# Patient Record
Sex: Female | Born: 1970 | Race: White | Hispanic: No | Marital: Single | Smoking: Current some day smoker
Health system: Southern US, Community
[De-identification: ages and names within clinical notes are randomized; demographics above are authoritative.]

## PROBLEM LIST (undated history)

## (undated) DIAGNOSIS — G4733 Obstructive sleep apnea (adult) (pediatric): Secondary | ICD-10-CM

## (undated) DIAGNOSIS — F329 Major depressive disorder, single episode, unspecified: Secondary | ICD-10-CM

## (undated) DIAGNOSIS — E7800 Pure hypercholesterolemia, unspecified: Secondary | ICD-10-CM

## (undated) DIAGNOSIS — F32A Depression, unspecified: Secondary | ICD-10-CM

## (undated) DIAGNOSIS — F419 Anxiety disorder, unspecified: Secondary | ICD-10-CM

## (undated) DIAGNOSIS — I509 Heart failure, unspecified: Secondary | ICD-10-CM

## (undated) DIAGNOSIS — I10 Essential (primary) hypertension: Secondary | ICD-10-CM

## (undated) HISTORY — PX: ELBOW SURGERY: SHX618

## (undated) HISTORY — DX: Obstructive sleep apnea (adult) (pediatric): G47.33

## (undated) HISTORY — DX: Heart failure, unspecified: I50.9

## (undated) HISTORY — PX: CHOLECYSTECTOMY: SHX55

---

## 2005-05-23 ENCOUNTER — Inpatient Hospital Stay (HOSPITAL_COMMUNITY): Admission: AD | Admit: 2005-05-23 | Discharge: 2005-06-03 | Payer: Self-pay | Admitting: Obstetrics and Gynecology

## 2005-05-23 ENCOUNTER — Ambulatory Visit: Payer: Self-pay | Admitting: Gynecology

## 2005-05-24 ENCOUNTER — Ambulatory Visit: Payer: Self-pay | Admitting: Neonatology

## 2005-05-31 ENCOUNTER — Ambulatory Visit: Payer: Self-pay | Admitting: Neonatology

## 2005-06-01 ENCOUNTER — Encounter (INDEPENDENT_AMBULATORY_CARE_PROVIDER_SITE_OTHER): Payer: Self-pay | Admitting: *Deleted

## 2007-07-03 ENCOUNTER — Emergency Department (HOSPITAL_COMMUNITY): Admission: EM | Admit: 2007-07-03 | Discharge: 2007-07-03 | Payer: Self-pay | Admitting: Emergency Medicine

## 2008-05-20 ENCOUNTER — Emergency Department (HOSPITAL_COMMUNITY): Admission: EM | Admit: 2008-05-20 | Discharge: 2008-05-20 | Payer: Self-pay | Admitting: Emergency Medicine

## 2008-08-27 ENCOUNTER — Emergency Department (HOSPITAL_COMMUNITY): Admission: EM | Admit: 2008-08-27 | Discharge: 2008-08-27 | Payer: Self-pay | Admitting: Emergency Medicine

## 2008-10-28 ENCOUNTER — Emergency Department (HOSPITAL_COMMUNITY): Admission: EM | Admit: 2008-10-28 | Discharge: 2008-10-28 | Payer: Self-pay | Admitting: Emergency Medicine

## 2008-12-28 ENCOUNTER — Emergency Department (HOSPITAL_COMMUNITY): Admission: EM | Admit: 2008-12-28 | Discharge: 2008-12-28 | Payer: Self-pay | Admitting: Emergency Medicine

## 2009-03-25 ENCOUNTER — Emergency Department (HOSPITAL_COMMUNITY): Admission: EM | Admit: 2009-03-25 | Discharge: 2009-03-25 | Payer: Self-pay | Admitting: Emergency Medicine

## 2010-04-11 LAB — URINALYSIS, ROUTINE W REFLEX MICROSCOPIC
Glucose, UA: NEGATIVE mg/dL
Nitrite: NEGATIVE
Protein, ur: 100 mg/dL — AB
Specific Gravity, Urine: >1.03 — ABNORMAL HIGH (ref 1.005–1.030)
Urobilinogen, UA: 0.2 mg/dL (ref 0.0–1.0)

## 2010-04-11 LAB — URINE MICROSCOPIC-ADD ON

## 2010-05-22 NOTE — Discharge Summary (Signed)
Beth Sanford, Beth Sanford                 ACCOUNT NO.:  [id number]   MEDICAL RECORD NO.:  [id number]          PATIENT TYPE:  INP   LOCATION:  9303                          FACILITY:  [facility]   PHYSICIAN:  Ginger Carne, MD  DATE OF BIRTH:  17-May-1970   DATE OF ADMISSION:  05/23/2005  DATE OF DISCHARGE:  06/03/2005                                 DISCHARGE SUMMARY   PRIMARY DISCHARGE DIAGNOSIS:  Preterm premature rupture of membranes leading  to preterm spontaneous vaginal delivery leading.   HISTORY OF PRESENT ILLNESS:  This is a 40 year old Caucasian female, G3, 0-1-  2-1, who delivered at 62 and 5/7ths weeks after being admitted 9 days  earlier secondary to rupture of membranes.  The patient was treated with  antibiotics, as well as given betamethasone, in anticipation of delivery.  On the day of delivery, the patient began running a fever and starting  having uterine irritability.  In addition, the fetal heart monitor showed  developing tachycardia on the part of the infant.  The patient, at that  point, was induced with Pitocin secondary to development of most likely  chorioamnionitis.  Delivered normal spontaneous vaginal with epidural  anesthesia.  A three vessel cord was spontaneous with estimated blood loss  of less than 500 mL.  No lacerations were seen.  Apgars of 6/7.  Mom is  bottle feeding.  She is O positive and rubella immune.  The patient will be  given the Depo shot prior to discharge.   She is to follow up at the [facility] in approximately 6 weeks  for a postpartum appointment.      Ginger Carne, MD  Electronically Signed     Ginger Carne, MD  Electronically Signed    SHB/MEDQ  D:  06/03/2005  T:  06/03/2005  Job:  [id number]

## 2010-07-21 ENCOUNTER — Emergency Department (HOSPITAL_COMMUNITY)
Admission: EM | Admit: 2010-07-21 | Discharge: 2010-07-21 | Disposition: A | Discharge status: Home / Self Care | Payer: Self-pay | Attending: Emergency Medicine | Admitting: Emergency Medicine

## 2010-07-21 ENCOUNTER — Encounter: Payer: Self-pay | Admitting: *Deleted

## 2010-07-21 DIAGNOSIS — X58XXXA Exposure to other specified factors, initial encounter: Secondary | ICD-10-CM | POA: Insufficient documentation

## 2010-07-21 DIAGNOSIS — S29011A Strain of muscle and tendon of front wall of thorax, initial encounter: Secondary | ICD-10-CM

## 2010-07-21 DIAGNOSIS — F17200 Nicotine dependence, unspecified, uncomplicated: Secondary | ICD-10-CM | POA: Insufficient documentation

## 2010-07-21 DIAGNOSIS — F341 Dysthymic disorder: Secondary | ICD-10-CM | POA: Insufficient documentation

## 2010-07-21 DIAGNOSIS — R059 Cough, unspecified: Secondary | ICD-10-CM | POA: Insufficient documentation

## 2010-07-21 DIAGNOSIS — H9209 Otalgia, unspecified ear: Secondary | ICD-10-CM | POA: Insufficient documentation

## 2010-07-21 DIAGNOSIS — R05 Cough: Secondary | ICD-10-CM

## 2010-07-21 DIAGNOSIS — IMO0002 Reserved for concepts with insufficient information to code with codable children: Secondary | ICD-10-CM | POA: Insufficient documentation

## 2010-07-21 DIAGNOSIS — I10 Essential (primary) hypertension: Secondary | ICD-10-CM | POA: Insufficient documentation

## 2010-07-21 HISTORY — DX: Essential (primary) hypertension: I10

## 2010-07-21 HISTORY — DX: Major depressive disorder, single episode, unspecified: F32.9

## 2010-07-21 HISTORY — DX: Pure hypercholesterolemia, unspecified: E78.00

## 2010-07-21 HISTORY — DX: Anxiety disorder, unspecified: F41.9

## 2010-07-21 HISTORY — DX: Depression, unspecified: F32.A

## 2010-07-21 MED ORDER — HYDROCODONE-ACETAMINOPHEN 5-325 MG PO TABS: ORAL_TABLET | ORAL | Status: AC

## 2010-07-21 MED ORDER — DEXAMETHASONE SODIUM PHOSPHATE 4 MG/ML IJ SOLN
10.0000 mg | Freq: Once | INTRAMUSCULAR | Status: AC
  Administered 2010-07-21: 12 mg via INTRAMUSCULAR
  Filled 2010-07-21: qty 3

## 2010-07-21 MED ORDER — AZITHROMYCIN 250 MG PO TABS: ORAL_TABLET | ORAL | Status: DC

## 2010-07-21 MED ORDER — ANTIPYRINE-BENZOCAINE 5.4-1.4 % OT SOLN
3.0000 [drp] | Freq: Once | OTIC | Status: AC
  Administered 2010-07-21: 3 [drp] via OTIC
  Filled 2010-07-21: qty 10

## 2010-07-21 NOTE — ED Provider Notes (Signed)
History     Chief Complaint  Patient presents with  . Otalgia   HPI Comments: PAtient c/o persistent cough, congestion and left ear pain for one week.  States the cough has been mostly non-productive, but does produce muscous at times.  Also states that she coughed so hard few days ago that she felt a "pop" in the left lateral ribs.  C/o pain to that area since that time that worsens with movement, palpation and coughing.  She denies fever, abd pain, vomiting, shortness of breath or chest pain  Patient is a 40 y.o. female presenting with ear pain. The history is provided by the patient.  Otalgia This is a new problem. The current episode started more than 2 days ago. There is pain in the left ear. The problem occurs constantly. The problem has not changed since onset.There has been no fever. The pain is moderate. Associated symptoms include cough. Pertinent negatives include no ear discharge, no headaches, no hearing loss, no rhinorrhea, no sore throat, no abdominal pain, no vomiting and no neck pain. Her past medical history does not include chronic ear infection.    Past Medical History  Diagnosis Date  . Hypertension   . High cholesterol   . Anxiety   . Depression     Past Surgical History  Procedure Date  . Cholecystectomy   . Elbow surgery left    Family History  Problem Relation Age of Onset  . Hypertension Brother     History  Substance Use Topics  . Smoking status: Current Everyday Smoker -- 0.5 packs/day for 35 years    Types: Cigarettes  . Smokeless tobacco: Not on file  . Alcohol Use: No    OB History    Grav Para Term Preterm Abortions TAB SAB Ect Mult Living   3 1   2            Review of Systems  Constitutional: Negative for fever, chills and appetite change.  HENT: Positive for ear pain and congestion. Negative for hearing loss, sore throat, rhinorrhea, trouble swallowing, neck pain and ear discharge.   Eyes: Negative for pain, discharge and visual  disturbance.  Respiratory: Positive for cough. Negative for chest tightness, shortness of breath and wheezing.   Cardiovascular: Negative.   Gastrointestinal: Negative.  Negative for vomiting and abdominal pain.  Genitourinary: Negative for dysuria and flank pain.  Musculoskeletal:       Left lateral rib pain  Skin: Negative.   Neurological: Negative for dizziness, weakness, numbness and headaches.  Hematological: Negative.   Psychiatric/Behavioral: Negative.     Physical Exam  BP 134/95  Pulse 74  Temp(Src) 98.7 F (37.1 C) (Oral)  Resp 20  Ht 5\' 2"  (1.575 m)  Wt 178 lb (80.74 kg)  BMI 32.56 kg/m2  SpO2 100%  LMP 07/18/2010  Physical Exam  Constitutional: She is oriented to person, place, and time. She appears well-developed and well-nourished. No distress.  HENT:  Head: Normocephalic.  Right Ear: Tympanic membrane, external ear and ear canal normal.  Left Ear: Hearing normal. There is tenderness. No drainage or swelling. Tympanic membrane is injected.  Mouth/Throat: No oropharyngeal exudate.  Eyes: Conjunctivae are normal. Pupils are equal, round, and reactive to light.  Neck: Normal range of motion. Neck supple. No thyromegaly present.  Cardiovascular: Normal rate, regular rhythm and normal heart sounds.   Pulmonary/Chest: Effort normal and breath sounds normal. No respiratory distress. She has no wheezes. She exhibits tenderness.  Coughing, left lateral chest wall tenderness  Abdominal: Soft. Bowel sounds are normal.  Musculoskeletal: Normal range of motion.  Lymphadenopathy:    She has no cervical adenopathy.  Neurological: She is alert and oriented to person, place, and time.  Skin: Skin is warm and dry.  Psychiatric: She has a normal mood and affect.    ED Course  Procedures  MDM   Patient is ambulatory. Non-toxic appearing.  NAD.  Vitals stable.  No hypoxia, tachycardia or tachypnea to suggest PE.  Pain to chest is reproduced with palpation.  Patient  agrees to f/u with her PMD       Joselyn Edling L. [place], [place] 07/21/10 1530

## 2010-07-21 NOTE — ED Notes (Signed)
Pt not tender to touch on left side. Pt requesting pain shot for side pain. Pt states she has pain in her left ear down her throat and left side of her head.

## 2010-07-21 NOTE — ED Notes (Signed)
Pt c/o left earache, cough and congestion since last Friday. States that she has coughed so much that she is having pain in her left ribs. States that OTC meds are not helping.

## 2010-07-22 NOTE — ED Provider Notes (Signed)
Medical screening examination/treatment/procedure(s) were performed by non-physician practitioner and as supervising physician I was immediately available for consultation/collaboration.   Beth Sanford M Refael Fulop, MD 07/22/10 0629 

## 2010-08-02 ENCOUNTER — Emergency Department (HOSPITAL_COMMUNITY): Admission: EM | Admit: 2010-08-02 | Discharge: 2010-08-02 | Discharge status: Against Medical Advice | Payer: Self-pay

## 2010-08-02 ENCOUNTER — Encounter (HOSPITAL_COMMUNITY): Payer: Self-pay | Admitting: Emergency Medicine

## 2010-08-02 NOTE — ED Notes (Signed)
Pt states she feels like her body is tightening up all over.

## 2010-09-29 ENCOUNTER — Other Ambulatory Visit (HOSPITAL_COMMUNITY): Payer: Self-pay | Admitting: Family Medicine

## 2010-09-29 DIAGNOSIS — Z139 Encounter for screening, unspecified: Secondary | ICD-10-CM

## 2010-10-05 ENCOUNTER — Ambulatory Visit (HOSPITAL_COMMUNITY): Payer: Self-pay

## 2010-10-20 ENCOUNTER — Ambulatory Visit (HOSPITAL_COMMUNITY): Payer: Self-pay

## 2011-06-28 ENCOUNTER — Emergency Department (HOSPITAL_COMMUNITY)
Admission: EM | Admit: 2011-06-28 | Discharge: 2011-06-28 | Disposition: A | Discharge status: Home / Self Care | Payer: Self-pay

## 2011-06-28 NOTE — ED Notes (Signed)
Pt called to come to triage, and said she had to go to her car.

## 2011-06-28 NOTE — ED Notes (Signed)
No answer when called 

## 2011-06-29 ENCOUNTER — Emergency Department (HOSPITAL_COMMUNITY)
Admission: EM | Admit: 2011-06-29 | Discharge: 2011-06-29 | Disposition: A | Discharge status: Home / Self Care | Payer: Self-pay | Attending: Emergency Medicine | Admitting: Emergency Medicine

## 2011-06-29 ENCOUNTER — Encounter (HOSPITAL_COMMUNITY): Payer: Self-pay | Admitting: *Deleted

## 2011-06-29 DIAGNOSIS — S39012A Strain of muscle, fascia and tendon of lower back, initial encounter: Secondary | ICD-10-CM

## 2011-06-29 DIAGNOSIS — Y9229 Other specified public building as the place of occurrence of the external cause: Secondary | ICD-10-CM | POA: Insufficient documentation

## 2011-06-29 DIAGNOSIS — F17200 Nicotine dependence, unspecified, uncomplicated: Secondary | ICD-10-CM | POA: Insufficient documentation

## 2011-06-29 DIAGNOSIS — S46919A Strain of unspecified muscle, fascia and tendon at shoulder and upper arm level, unspecified arm, initial encounter: Secondary | ICD-10-CM

## 2011-06-29 DIAGNOSIS — X58XXXA Exposure to other specified factors, initial encounter: Secondary | ICD-10-CM | POA: Insufficient documentation

## 2011-06-29 DIAGNOSIS — S335XXA Sprain of ligaments of lumbar spine, initial encounter: Secondary | ICD-10-CM | POA: Insufficient documentation

## 2011-06-29 DIAGNOSIS — IMO0002 Reserved for concepts with insufficient information to code with codable children: Secondary | ICD-10-CM | POA: Insufficient documentation

## 2011-06-29 DIAGNOSIS — M79609 Pain in unspecified limb: Secondary | ICD-10-CM | POA: Insufficient documentation

## 2011-06-29 DIAGNOSIS — M79673 Pain in unspecified foot: Secondary | ICD-10-CM

## 2011-06-29 DIAGNOSIS — I10 Essential (primary) hypertension: Secondary | ICD-10-CM

## 2011-06-29 DIAGNOSIS — E7800 Pure hypercholesterolemia, unspecified: Secondary | ICD-10-CM | POA: Insufficient documentation

## 2011-06-29 MED ORDER — PROMETHAZINE HCL 12.5 MG PO TABS
12.5000 mg | ORAL_TABLET | Freq: Once | ORAL | Status: AC
  Administered 2011-06-29: 12.5 mg via ORAL
  Filled 2011-06-29: qty 1

## 2011-06-29 MED ORDER — HYDROCODONE-ACETAMINOPHEN 5-325 MG PO TABS
1.0000 | ORAL_TABLET | Freq: Once | ORAL | Status: AC
  Administered 2011-06-29: 1 via ORAL
  Filled 2011-06-29: qty 1

## 2011-06-29 MED ORDER — HYDROCODONE-ACETAMINOPHEN 7.5-325 MG PO TABS: 1.0000 | ORAL_TABLET | ORAL | Status: AC | PRN

## 2011-06-29 MED ORDER — BACLOFEN 10 MG PO TABS: 10.0000 mg | ORAL_TABLET | Freq: Three times a day (TID) | ORAL | Status: DC

## 2011-06-29 MED ORDER — LISINOPRIL 2.5 MG PO TABS: 20.0000 mg | ORAL_TABLET | Freq: Every day | ORAL | Status: DC

## 2011-06-29 MED ORDER — LISINOPRIL-HYDROCHLOROTHIAZIDE 20-12.5 MG PO TABS: 1.0000 | ORAL_TABLET | Freq: Every day | ORAL | Status: DC

## 2011-06-29 MED ORDER — BACLOFEN 10 MG PO TABS: 10.0000 mg | ORAL_TABLET | Freq: Three times a day (TID) | ORAL | Status: AC

## 2011-06-29 MED ORDER — HYDROCODONE-ACETAMINOPHEN 7.5-325 MG PO TABS: 1.0000 | ORAL_TABLET | ORAL | Status: DC | PRN

## 2011-06-29 NOTE — ED Notes (Signed)
Pt states that she is not taking any of her medications for a month because she is out of her medications

## 2011-06-29 NOTE — Discharge Instructions (Signed)
Your exam is consistent with strain of multiple sites. Your blood pressure is elevated today. Please take the lisinopril as suggested, please see your MD for recheck of your shoulder and back in the office.Muscle Strain A muscle strain, or pulled muscle, occurs when a muscle is over-stretched. A small number of muscle fibers may also be torn. This is especially common in athletes. This happens when a sudden violent force placed on a muscle pushes it past its capacity. Usually, recovery from a pulled muscle takes 1 to 2 weeks. But complete healing will take 5 to 6 weeks. There are millions of muscle fibers. Following injury, your body will usually return to normal quickly. HOME CARE INSTRUCTIONS   While awake, apply ice to the sore muscle for 15 to 20 minutes each hour for the first 2 days. Put ice in a plastic bag and place a towel between the bag of ice and your skin.   Do not use the pulled muscle for several days. Do not use the muscle if you have pain.   You may wrap the injured area with an elastic bandage for comfort. Be careful not to bind it too tightly. This may interfere with blood circulation.   Only take over-the-counter or prescription medicines for pain, discomfort, or fever as directed by your caregiver. Do not use aspirin as this will increase bleeding (bruising) at injury site.   Warming up before exercise helps prevent muscle strains.  SEEK MEDICAL CARE IF:  There is increased pain or swelling in the affected area. MAKE SURE YOU:   Understand these instructions.   Will watch your condition.   Will get help right away if you are not doing well or get worse.  Document Released: 12/21/2004 Document Revised: 12/10/2010 Document Reviewed: 07/20/2006 ExitCare Patient Information 2012 ExitCare, LLCHypertension Hypertension is another name for high blood pressure. High blood pressure may mean that your heart needs to work harder to pump blood. Blood pressure consists of two  numbers, which includes a higher number over a lower number (example: 110/72). HOME CARE   Make lifestyle changes as told by your doctor. This may include weight loss and exercise.   Take your blood pressure medicine every day.   Limit how much salt you use.   Stop smoking if you smoke.   Do not use drugs.   Talk to your doctor if you are using decongestants or birth control pills. These medicines might make blood pressure higher.   Females should not drink more than 1 alcoholic drink per day. Males should not drink more than 2 alcoholic drinks per day.   See your doctor as told.  GET HELP RIGHT AWAY IF:   You have a blood pressure reading with a top number of 180 or higher.   You get a very bad headache.   You get blurred or changing vision.   You feel confused.   You feel weak, numb, or faint.   You get chest or belly (abdominal) pain.   You throw up (vomit).   You cannot breathe very well.  MAKE SURE YOU:   Understand these instructions.   Will watch your condition.   Will get help right away if you are not doing well or get worse.  Document Released: 06/09/2007 Document Revised: 12/10/2010 Document Reviewed: 06/09/2007 [facility] Patient Information 2012 [place], [place].Marland Kitchen

## 2011-06-29 NOTE — ED Notes (Addendum)
Pt states that she [place] the mall Friday and [place] and fell, pt c/o lower back pain worse on left side, left shoulder and left foot. Pt ambulatory to triage without any problems

## 2011-06-29 NOTE — ED Provider Notes (Signed)
History     CSN: [id number]  Arrival date & time 06/29/11  1255   First MD Initiated Contact with Patient 06/29/11 1346      Chief Complaint  Patient presents with  . Fall    (Consider location/radiation/quality/duration/timing/severity/associated sxs/prior treatment) HPI Comments: Patient states that on June 21 she [place] [employer] [place], where she slipped on some ice cream on a floor and injured her lower back, left shoulder, and she has some pain in the left foot. The patient states that on June 21 she only had mild soreness. Today she states that the pain is much worse and she requests to be evaluated and to receive treatment for her pain. She has not been evaluated since the accident before this particular ER visit. She did not have loss of consciousness. He has not noted any blood in the urine or stool since the accident. She's not had loss of control of bowel or bladder. It is also of note that the patient is treated for hypertension, but she has not been to the health department to receive her medication recently.  Patient is a 41 y.o. female presenting with fall. The history is provided by the patient.  Fall Pertinent negatives include no abdominal pain and no hematuria.    Past Medical History  Diagnosis Date  . Hypertension   . High cholesterol   . Anxiety   . Depression   . Cancer     Past Surgical History  Procedure Date  . Cholecystectomy   . Elbow surgery left    Family History  Problem Relation Age of Onset  . Hypertension Brother     History  Substance Use Topics  . Smoking status: Current Everyday Smoker -- 0.5 packs/day for 35 years    Types: Cigarettes  . Smokeless tobacco: Not on file  . Alcohol Use: No    OB History    Grav Para Term Preterm Abortions TAB SAB Ect Mult Living   3 1   2            Review of Systems  Constitutional: Negative for activity change.       All ROS Neg except as noted in HPI  HENT: Negative for nosebleeds and  neck pain.   Eyes: Negative for photophobia and discharge.  Respiratory: Negative for cough, shortness of breath and wheezing.   Cardiovascular: Negative for chest pain and palpitations.  Gastrointestinal: Negative for abdominal pain and blood in stool.  Genitourinary: Negative for dysuria, frequency and hematuria.  Musculoskeletal: Positive for back pain. Negative for arthralgias.  Skin: Negative.   Neurological: Negative for dizziness, seizures and speech difficulty.  Psychiatric/Behavioral: Negative for hallucinations and confusion. The patient is nervous/anxious.        Depression    Allergies  Codeine  Home Medications   Current Outpatient Rx  Name Route Sig Dispense Refill  . ALPRAZOLAM 1 MG PO TABS Oral Take 1 mg by mouth 3 (three) times daily as needed.      . ATENOLOL 25 MG PO TABS Oral Take 25 mg by mouth daily.      . AZITHROMYCIN 250 MG PO TABS  Two tabs po qd day one, then one tab po qd days 2-5 6 tablet 0  . BACLOFEN 10 MG PO TABS Oral Take 1 tablet (10 mg total) by mouth 3 (three) times daily. 21 each 0  . CITALOPRAM HYDROBROMIDE 40 MG PO TABS Oral Take 40 mg by mouth daily.      Marland Kitchen  OMEGA-3 FATTY ACIDS 1000 MG PO CAPS Oral Take 2 g by mouth daily.      Marland Kitchen HYDROCODONE-ACETAMINOPHEN 7.5-325 MG PO TABS Oral Take 1 tablet by mouth every 4 (four) hours as needed for pain. 15 tablet 0  . LISINOPRIL-HYDROCHLOROTHIAZIDE 20-25 MG PO TABS Oral Take 1 tablet by mouth daily.      Marland Kitchen LISINOPRIL-HYDROCHLOROTHIAZIDE 20-12.5 MG PO TABS Oral Take 1 tablet by mouth daily. 30 tablet 0    BP 165/121  Pulse 85  Temp 97.8 F (36.6 C) (Oral)  Resp 18  Ht 5' 2.5" (1.588 m)  Wt 172 lb (78.019 kg)  BMI 30.96 kg/m2  SpO2 100%  LMP 06/05/2011  Physical Exam  Nursing note and vitals reviewed. Constitutional: She is oriented to person, place, and time. She appears well-developed and well-nourished.  Non-toxic appearance.  HENT:  Head: Normocephalic.  Right Ear: Tympanic membrane and  external ear normal.  Left Ear: Tympanic membrane and external ear normal.  Eyes: EOM and lids are normal. Pupils are equal, round, and reactive to light.  Neck: Normal range of motion. Neck supple. Carotid bruit is not present.  Cardiovascular: Normal rate, regular rhythm, normal heart sounds, intact distal pulses and normal pulses.   Pulmonary/Chest: Breath sounds normal. No respiratory distress.  Abdominal: Soft. Bowel sounds are normal. There is no tenderness. There is no guarding.  Musculoskeletal: Normal range of motion.       There is pain with range of motion of the left shoulder. There is pain to palpation of the lower trapezius area on left. There is no dislocation. The distal pulses are symmetrical sensory is symmetrical. There is pain to palpation of the lumbar area. There is pain with change of position of the lumbar area. No palpable step down or deformity. There is pain to the dorsum of the left foot. There is a moderate size bunion of this foot. There is good capillary refill. There is no deformity involving the metatarsal heads. There no lesions between the toes. There is full range of motion of the toes. The Achilles tendon is intact.`  Lymphadenopathy:       Head (right side): No submandibular adenopathy present.       Head (left side): No submandibular adenopathy present.    She has no cervical adenopathy.  Neurological: She is alert and oriented to person, place, and time. She has normal strength. No cranial nerve deficit or sensory deficit.  Skin: Skin is warm and dry.  Psychiatric: Her speech is normal. Her mood appears anxious.    ED Course  Procedures (including critical care time)  Labs Reviewed - No data to display No results found.   1. Shoulder strain   2. Lumbar strain   3. Foot pain   4. Hypertension       MDM  I have reviewed nursing notes, vital signs, and all appropriate lab and imaging results for this patient. Patient's blood pressure elevated at  165/121. Prescription for Zestoretic 20-25 given to the patient. Patient strongly instructed to see her physician at the health department to monitor and manage her blood pressure. Prescription for Norco and baclofen given to the patient.       Kathie Dike, [place] 06/29/11 1427

## 2011-06-30 NOTE — ED Provider Notes (Signed)
Medical screening examination/treatment/procedure(s) were performed by non-physician practitioner and as supervising physician I was immediately available for consultation/collaboration.   Benigna Delisi L Abdulwahab Demelo, MD 06/30/11 0822 

## 2011-12-12 ENCOUNTER — Emergency Department (HOSPITAL_COMMUNITY): Payer: Self-pay

## 2011-12-12 ENCOUNTER — Emergency Department (HOSPITAL_COMMUNITY)
Admission: EM | Admit: 2011-12-12 | Discharge: 2011-12-12 | Disposition: A | Discharge status: Home / Self Care | Payer: Self-pay | Attending: Emergency Medicine | Admitting: Emergency Medicine

## 2011-12-12 ENCOUNTER — Encounter (HOSPITAL_COMMUNITY): Payer: Self-pay

## 2011-12-12 DIAGNOSIS — F329 Major depressive disorder, single episode, unspecified: Secondary | ICD-10-CM | POA: Insufficient documentation

## 2011-12-12 DIAGNOSIS — I10 Essential (primary) hypertension: Secondary | ICD-10-CM | POA: Insufficient documentation

## 2011-12-12 DIAGNOSIS — S5000XA Contusion of unspecified elbow, initial encounter: Secondary | ICD-10-CM | POA: Insufficient documentation

## 2011-12-12 DIAGNOSIS — R51 Headache: Secondary | ICD-10-CM | POA: Insufficient documentation

## 2011-12-12 DIAGNOSIS — M542 Cervicalgia: Secondary | ICD-10-CM | POA: Insufficient documentation

## 2011-12-12 DIAGNOSIS — S5002XA Contusion of left elbow, initial encounter: Secondary | ICD-10-CM

## 2011-12-12 DIAGNOSIS — R221 Localized swelling, mass and lump, neck: Secondary | ICD-10-CM | POA: Insufficient documentation

## 2011-12-12 DIAGNOSIS — R0789 Other chest pain: Secondary | ICD-10-CM

## 2011-12-12 DIAGNOSIS — F411 Generalized anxiety disorder: Secondary | ICD-10-CM | POA: Insufficient documentation

## 2011-12-12 DIAGNOSIS — F3289 Other specified depressive episodes: Secondary | ICD-10-CM | POA: Insufficient documentation

## 2011-12-12 DIAGNOSIS — E7800 Pure hypercholesterolemia, unspecified: Secondary | ICD-10-CM | POA: Insufficient documentation

## 2011-12-12 DIAGNOSIS — Z79899 Other long term (current) drug therapy: Secondary | ICD-10-CM | POA: Insufficient documentation

## 2011-12-12 DIAGNOSIS — S022XXA Fracture of nasal bones, initial encounter for closed fracture: Secondary | ICD-10-CM | POA: Insufficient documentation

## 2011-12-12 DIAGNOSIS — F17200 Nicotine dependence, unspecified, uncomplicated: Secondary | ICD-10-CM | POA: Insufficient documentation

## 2011-12-12 DIAGNOSIS — S5001XA Contusion of right elbow, initial encounter: Secondary | ICD-10-CM

## 2011-12-12 DIAGNOSIS — M549 Dorsalgia, unspecified: Secondary | ICD-10-CM | POA: Insufficient documentation

## 2011-12-12 DIAGNOSIS — R220 Localized swelling, mass and lump, head: Secondary | ICD-10-CM | POA: Insufficient documentation

## 2011-12-12 DIAGNOSIS — R071 Chest pain on breathing: Secondary | ICD-10-CM | POA: Insufficient documentation

## 2011-12-12 MED ORDER — TRAMADOL HCL 50 MG PO TABS
100.0000 mg | ORAL_TABLET | Freq: Once | ORAL | Status: AC
  Administered 2011-12-12: 100 mg via ORAL
  Filled 2011-12-12: qty 2

## 2011-12-12 MED ORDER — CYCLOBENZAPRINE HCL 10 MG PO TABS
10.0000 mg | ORAL_TABLET | Freq: Once | ORAL | Status: AC
  Administered 2011-12-12: 10 mg via ORAL
  Filled 2011-12-12: qty 1

## 2011-12-12 MED ORDER — ACETAMINOPHEN 500 MG PO TABS
1000.0000 mg | ORAL_TABLET | Freq: Once | ORAL | Status: AC
  Administered 2011-12-12: 1000 mg via ORAL
  Filled 2011-12-12: qty 2

## 2011-12-12 MED ORDER — IBUPROFEN 800 MG PO TABS
800.0000 mg | ORAL_TABLET | Freq: Once | ORAL | Status: AC
  Administered 2011-12-12: 800 mg via ORAL
  Filled 2011-12-12: qty 1

## 2011-12-12 MED ORDER — HYDROCODONE-ACETAMINOPHEN 5-325 MG PO TABS: ORAL_TABLET | ORAL | Status: DC

## 2011-12-12 MED ORDER — CYCLOBENZAPRINE HCL 10 MG PO TABS: 10.0000 mg | ORAL_TABLET | Freq: Three times a day (TID) | ORAL | Status: DC | PRN
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
## 2012-01-12 ENCOUNTER — Encounter (HOSPITAL_COMMUNITY): Payer: Self-pay | Admitting: *Deleted

## 2012-01-12 ENCOUNTER — Emergency Department (HOSPITAL_COMMUNITY)
Admission: EM | Admit: 2012-01-12 | Discharge: 2012-01-12 | Disposition: A | Discharge status: Home / Self Care | Payer: Self-pay | Attending: Emergency Medicine | Admitting: Emergency Medicine

## 2012-01-12 ENCOUNTER — Emergency Department (HOSPITAL_COMMUNITY): Payer: Self-pay

## 2012-01-12 DIAGNOSIS — Z79899 Other long term (current) drug therapy: Secondary | ICD-10-CM | POA: Insufficient documentation

## 2012-01-12 DIAGNOSIS — F3289 Other specified depressive episodes: Secondary | ICD-10-CM | POA: Insufficient documentation

## 2012-01-12 DIAGNOSIS — M79603 Pain in arm, unspecified: Secondary | ICD-10-CM

## 2012-01-12 DIAGNOSIS — F411 Generalized anxiety disorder: Secondary | ICD-10-CM | POA: Insufficient documentation

## 2012-01-12 DIAGNOSIS — I10 Essential (primary) hypertension: Secondary | ICD-10-CM | POA: Insufficient documentation

## 2012-01-12 DIAGNOSIS — F329 Major depressive disorder, single episode, unspecified: Secondary | ICD-10-CM | POA: Insufficient documentation

## 2012-01-12 DIAGNOSIS — M79609 Pain in unspecified limb: Secondary | ICD-10-CM | POA: Insufficient documentation

## 2012-01-12 DIAGNOSIS — E7800 Pure hypercholesterolemia, unspecified: Secondary | ICD-10-CM | POA: Insufficient documentation

## 2012-01-12 DIAGNOSIS — F17200 Nicotine dependence, unspecified, uncomplicated: Secondary | ICD-10-CM | POA: Insufficient documentation

## 2012-01-12 MED ORDER — TRAMADOL HCL 50 MG PO TABS: 100.0000 mg | ORAL_TABLET | Freq: Four times a day (QID) | ORAL | Status: DC | PRN

## 2012-01-12 MED ORDER — TRAMADOL HCL 50 MG PO TABS
100.0000 mg | ORAL_TABLET | Freq: Once | ORAL | Status: AC
  Administered 2012-01-12: 100 mg via ORAL
  Filled 2012-01-12: qty 2

## 2012-01-12 MED ORDER — IBUPROFEN 800 MG PO TABS: 800.0000 mg | ORAL_TABLET | Freq: Once | ORAL | Status: DC

## 2012-01-12 MED ORDER — ACETAMINOPHEN 500 MG PO TABS
1000.0000 mg | ORAL_TABLET | Freq: Once | ORAL | Status: AC
  Administered 2012-01-12: 1000 mg via ORAL
  Filled 2012-01-12: qty 2

## 2012-01-12 MED ORDER — CYCLOBENZAPRINE HCL 10 MG PO TABS: 10.0000 mg | ORAL_TABLET | Freq: Three times a day (TID) | ORAL | Status: DC | PRN
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
## 2012-05-12 ENCOUNTER — Encounter (HOSPITAL_COMMUNITY): Payer: Self-pay | Admitting: *Deleted

## 2012-05-12 ENCOUNTER — Emergency Department (HOSPITAL_COMMUNITY)
Admission: EM | Admit: 2012-05-12 | Discharge: 2012-05-12 | Disposition: A | Discharge status: Home / Self Care | Payer: Self-pay | Attending: Emergency Medicine | Admitting: Emergency Medicine

## 2012-05-12 DIAGNOSIS — F411 Generalized anxiety disorder: Secondary | ICD-10-CM | POA: Insufficient documentation

## 2012-05-12 DIAGNOSIS — N159 Renal tubulo-interstitial disease, unspecified: Secondary | ICD-10-CM | POA: Insufficient documentation

## 2012-05-12 DIAGNOSIS — R3915 Urgency of urination: Secondary | ICD-10-CM | POA: Insufficient documentation

## 2012-05-12 DIAGNOSIS — M545 Low back pain, unspecified: Secondary | ICD-10-CM | POA: Insufficient documentation

## 2012-05-12 DIAGNOSIS — F329 Major depressive disorder, single episode, unspecified: Secondary | ICD-10-CM | POA: Insufficient documentation

## 2012-05-12 DIAGNOSIS — I10 Essential (primary) hypertension: Secondary | ICD-10-CM | POA: Insufficient documentation

## 2012-05-12 DIAGNOSIS — R450 Nervousness: Secondary | ICD-10-CM | POA: Insufficient documentation

## 2012-05-12 DIAGNOSIS — R350 Frequency of micturition: Secondary | ICD-10-CM | POA: Insufficient documentation

## 2012-05-12 DIAGNOSIS — E7800 Pure hypercholesterolemia, unspecified: Secondary | ICD-10-CM | POA: Insufficient documentation

## 2012-05-12 DIAGNOSIS — Z9089 Acquired absence of other organs: Secondary | ICD-10-CM | POA: Insufficient documentation

## 2012-05-12 DIAGNOSIS — F17200 Nicotine dependence, unspecified, uncomplicated: Secondary | ICD-10-CM | POA: Insufficient documentation

## 2012-05-12 DIAGNOSIS — F3289 Other specified depressive episodes: Secondary | ICD-10-CM | POA: Insufficient documentation

## 2012-05-12 DIAGNOSIS — Z79899 Other long term (current) drug therapy: Secondary | ICD-10-CM | POA: Insufficient documentation

## 2012-05-12 DIAGNOSIS — N949 Unspecified condition associated with female genital organs and menstrual cycle: Secondary | ICD-10-CM | POA: Insufficient documentation

## 2012-05-12 LAB — CBC WITH DIFFERENTIAL/PLATELET
Basophils Relative: 0 % (ref 0–1)
Eosinophils Absolute: 0.2 10*3/uL (ref 0.0–0.7)
MCH: 32.4 pg (ref 26.0–34.0)
MCHC: 34.3 g/dL (ref 30.0–36.0)
Neutro Abs: 8.9 10*3/uL — ABNORMAL HIGH (ref 1.7–7.7)
Neutrophils Relative %: 70 % (ref 43–77)
Platelets: 258 10*3/uL (ref 150–400)
RBC: 4.87 MIL/uL (ref 3.87–5.11)

## 2012-05-12 LAB — URINALYSIS, ROUTINE W REFLEX MICROSCOPIC
Bilirubin Urine: NEGATIVE
Glucose, UA: NEGATIVE mg/dL
Ketones, ur: NEGATIVE mg/dL
Nitrite: NEGATIVE
Specific Gravity, Urine: 1.01 (ref 1.005–1.030)
pH: 5.5 (ref 5.0–8.0)

## 2012-05-12 LAB — URINE MICROSCOPIC-ADD ON

## 2012-05-12 MED ORDER — HYDROCODONE-ACETAMINOPHEN 5-325 MG PO TABS: 1.0000 | ORAL_TABLET | ORAL | Status: DC | PRN

## 2012-05-12 MED ORDER — CEFTRIAXONE SODIUM 1 G IJ SOLR
1.0000 g | Freq: Once | INTRAMUSCULAR | Status: AC
  Administered 2012-05-12: 1 g via INTRAMUSCULAR
  Filled 2012-05-12: qty 10

## 2012-05-12 MED ORDER — ONDANSETRON HCL 4 MG/2ML IJ SOLN
4.0000 mg | Freq: Once | INTRAMUSCULAR | Status: AC
  Administered 2012-05-12: 4 mg via INTRAVENOUS
  Filled 2012-05-12: qty 2

## 2012-05-12 MED ORDER — SODIUM CHLORIDE 0.9 % IV SOLN
INTRAVENOUS | Status: DC
  Administered 2012-05-12: 15:00:00 via INTRAVENOUS

## 2012-05-12 MED ORDER — CEPHALEXIN 250 MG PO CAPS: 250.0000 mg | ORAL_CAPSULE | Freq: Four times a day (QID) | ORAL | Status: DC

## 2012-05-12 MED ORDER — HYDROMORPHONE HCL PF 1 MG/ML IJ SOLN
1.0000 mg | Freq: Once | INTRAMUSCULAR | Status: AC
  Administered 2012-05-12: 1 mg via INTRAVENOUS
  Filled 2012-05-12: qty 1

## 2012-05-12 MED ORDER — PHENAZOPYRIDINE HCL 200 MG PO TABS: 200.0000 mg | ORAL_TABLET | Freq: Three times a day (TID) | ORAL | Status: DC

## 2012-05-12 MED ORDER — PHENAZOPYRIDINE HCL 100 MG PO TABS
200.0000 mg | ORAL_TABLET | Freq: Once | ORAL | Status: AC
  Administered 2012-05-12: 200 mg via ORAL
  Filled 2012-05-12: qty 2

## 2012-05-12 NOTE — ED Notes (Signed)
Dysuria , low abd pain, with low back pain,  No fever or chills.  Voids sm amts.

## 2012-05-12 NOTE — ED Provider Notes (Signed)
History     CSN: [id number]  Arrival date & time 05/12/12  1226   First MD Initiated Contact with Patient 05/12/12 1301      Chief Complaint  Patient presents with  . Dysuria    (Consider location/radiation/quality/duration/timing/severity/associated sxs/prior treatment) Patient is a 42 y.o. female presenting with frequency. The history is provided by the patient.  Urinary Frequency This is a new problem. The current episode started in the past 7 days. The problem occurs every several days. The problem has been gradually worsening. Associated symptoms include abdominal pain. Pertinent negatives include no chest pain, chills, coughing, fever, headaches, nausea, neck pain, rash or vomiting. She has tried nothing for the symptoms.   Beth Sanford is a 42 y.o. female who presents to the ED with dysuria. Symptoms started 2 days ago.  Past Medical History  Diagnosis Date  . Hypertension   . High cholesterol   . Anxiety   . Depression     Past Surgical History  Procedure Laterality Date  . Cholecystectomy    . Elbow surgery  left    Family History  Problem Relation Age of Onset  . Hypertension Brother     History  Substance Use Topics  . Smoking status: Current Every Day Smoker -- 0.50 packs/day for 35 years    Types: Cigarettes  . Smokeless tobacco: Not on file  . Alcohol Use: No    OB History   Grav Para Term Preterm Abortions TAB SAB Ect Mult Living   3 1   2            Review of Systems  Constitutional: Negative for fever and chills.  HENT: Negative for neck pain.   Eyes: Negative for visual disturbance.  Respiratory: Negative for cough and wheezing.   Cardiovascular: Negative for chest pain.  Gastrointestinal: Positive for abdominal pain. Negative for nausea and vomiting.  Genitourinary: Positive for dysuria, urgency, frequency and pelvic pain. Negative for vaginal bleeding, vaginal discharge and vaginal pain.  Musculoskeletal: Positive for back pain.  Skin:  Negative for rash.  Neurological: Negative for light-headedness and headaches.  Psychiatric/Behavioral: The patient is nervous/anxious.     Allergies  Codeine  Home Medications   Current Outpatient Rx  Name  Route  Sig  Dispense  Refill  . ALPRAZolam (XANAX) 1 MG tablet   Oral   Take 1 mg by mouth 3 (three) times daily as needed. anxiety         . citalopram (CELEXA) 40 MG tablet   Oral   Take 40 mg by mouth daily.           . cyclobenzaprine (FLEXERIL) 10 MG tablet   Oral   Take 1 tablet (10 mg total) by mouth 3 (three) times daily as needed for muscle spasms.   30 tablet   0   . fish oil-omega-3 fatty acids 1000 MG capsule   Oral   Take 1 g by mouth daily.          Marland Kitchen lisinopril-hydrochlorothiazide (PRINZIDE,ZESTORETIC) 20-12.5 MG per tablet   Oral   Take 1 tablet by mouth every morning.         . traMADol (ULTRAM) 50 MG tablet   Oral   Take 2 tablets (100 mg total) by mouth every 6 (six) hours as needed for pain.   30 tablet   0     BP 154/87  Pulse 78  Temp(Src) 97.5 F (36.4 C) (Oral)  Resp 20  Ht 5' 2.5" (  1.588 m)  Wt 166 lb (75.297 kg)  BMI 29.86 kg/m2  SpO2 100%  LMP 04/19/2012  Physical Exam  Nursing note and vitals reviewed. Constitutional: She is oriented to person, place, and time. She appears well-developed and well-nourished. No distress.  HENT:  Head: Normocephalic and atraumatic.  Eyes: Conjunctivae and EOM are normal.  Neck: Normal range of motion. Neck supple.  Cardiovascular: Normal rate and regular rhythm.   Pulmonary/Chest: Effort normal and breath sounds normal. She has no wheezes.  Abdominal: Soft. Bowel sounds are normal. There is tenderness in the suprapubic area. There is no rebound, no guarding and no CVA tenderness.  Musculoskeletal: Normal range of motion.  Tender with palpation lower lumbar area.   Neurological: She is alert and oriented to person, place, and time. No cranial nerve deficit.  Skin: Skin is warm and  dry.  Psychiatric: Her mood appears anxious.   Results for orders placed during the hospital encounter of 05/12/12 (from the past 24 hour(s))  URINALYSIS, ROUTINE W REFLEX MICROSCOPIC     Status: Abnormal   Collection Time    05/12/12 12:50 PM      Result Value Range   Color, Urine YELLOW  YELLOW   APPearance CLEAR  CLEAR   Specific Gravity, Urine 1.010  1.005 - 1.030   pH 5.5  5.0 - 8.0   Glucose, UA NEGATIVE  NEGATIVE mg/dL   Hgb urine dipstick TRACE (*) NEGATIVE   Bilirubin Urine NEGATIVE  NEGATIVE   Ketones, ur NEGATIVE  NEGATIVE mg/dL   Protein, ur NEGATIVE  NEGATIVE mg/dL   Urobilinogen, UA 0.2  0.0 - 1.0 mg/dL   Nitrite NEGATIVE  NEGATIVE   Leukocytes, UA TRACE (*) NEGATIVE  URINE MICROSCOPIC-ADD ON     Status: Abnormal   Collection Time    05/12/12 12:50 PM      Result Value Range   Squamous Epithelial / LPF MANY (*) RARE   WBC, UA 21-50  <3 WBC/hpf   RBC / HPF 0-2  <3 RBC/hpf   Bacteria, UA RARE  RARE  CBC WITH DIFFERENTIAL     Status: Abnormal   Collection Time    05/12/12  2:35 PM      Result Value Range   WBC 12.7 (*) 4.0 - 10.5 K/uL   RBC 4.87  3.87 - 5.11 MIL/uL   Hemoglobin 15.8 (*) 12.0 - 15.0 g/dL   HCT 16.1  09.6 - 04.5 %   MCV 94.5  78.0 - 100.0 fL   MCH 32.4  26.0 - 34.0 pg   MCHC 34.3  30.0 - 36.0 g/dL   RDW 40.9  81.1 - 91.4 %   Platelets 258  150 - 400 K/uL   Neutrophils Relative 70  43 - 77 %   Neutro Abs 8.9 (*) 1.7 - 7.7 K/uL   Lymphocytes Relative 24  12 - 46 %   Lymphs Abs 3.0  0.7 - 4.0 K/uL   Monocytes Relative 5  3 - 12 %   Monocytes Absolute 0.6  0.1 - 1.0 K/uL   Eosinophils Relative 2  0 - 5 %   Eosinophils Absolute 0.2  0.0 - 0.7 K/uL   Basophils Relative 0  0 - 1 %   Basophils Absolute 0.0  0.0 - 0.1 K/uL    Assessment: 42 y.o. female with dysuria, frequency, suprapubic tenderness and low back pain   UTI   Low back pain  Plan:  Antibiotics, pain management, follow up with PCP, return here  as needed   Urine sent for  culture ED Course  Procedures (including critical care time)  MDM  Patient feeling much better after IV hydration, IV Rocephin, pyridium PO, Zofran IV, Toradol IV, taking PO fluids without nausea.  I have reviewed this patient's vital signs, nurses notes, appropriate labs and discussed findings with the patient and plan of care. Patient voices understanding. Stable for discharge home without immediate complications.    Medication List    TAKE these medications       cephALEXin 250 MG capsule  Commonly known as:  KEFLEX  Take 1 capsule (250 mg total) by mouth 4 (four) times daily.     HYDROcodone-acetaminophen 5-325 MG per tablet  Commonly known as:  NORCO/VICODIN  Take 1 tablet by mouth every 4 (four) hours as needed.     phenazopyridine 200 MG tablet  Commonly known as:  PYRIDIUM  Take 1 tablet (200 mg total) by mouth 3 (three) times daily.      ASK your doctor about these medications       ALPRAZolam 1 MG tablet  Commonly known as:  XANAX  Take 1 mg by mouth 3 (three) times daily as needed. anxiety     citalopram 40 MG tablet  Commonly known as:  CELEXA  Take 40 mg by mouth daily.     fish oil-omega-3 fatty acids 1000 MG capsule  Take 1 g by mouth daily.     lisinopril-hydrochlorothiazide 20-12.5 MG per tablet  Commonly known as:  PRINZIDE,ZESTORETIC  Take 1 tablet by mouth every morning.     verapamil 80 MG tablet  Commonly known as:  CALAN  Take 80 mg by mouth 2 (two) times daily.               [facility] Orlene Och, [place] 05/12/12 [phone]

## 2012-05-14 NOTE — ED Provider Notes (Signed)
Medical screening examination/treatment/procedure(s) were conducted as a shared visit with non-physician practitioner(s) and myself.  I personally evaluated the patient during the encounter.  Obvious UTI.  IV Rocephin.  D/C on po atb  Donnetta Hutching, MD 05/14/12 [phone]

## 2012-05-15 LAB — URINE CULTURE

## 2012-05-16 NOTE — ED Notes (Signed)
Post ED Visit - Positive Culture Follow-up  Culture report reviewed by antimicrobial stewardship pharmacist: []  Wes Dulaney, Pharm.D., BCPS [x]  Celedonio Miyamoto, Pharm.D., BCPS []  Georgina Pillion, Pharm.D., BCPS []  [place], [place].D., BCPS, AAHIVP []  Estella Husk, Pharm.D., BCPS, AAHIV  Positive urine culture Treated with cephalexin- organism sensitive to the same and no further patient follow-up is required at this time.  Larena Sox 05/16/2012, 6:50 PM

## 2012-06-29 ENCOUNTER — Emergency Department (HOSPITAL_COMMUNITY)
Admission: EM | Admit: 2012-06-29 | Discharge: 2012-06-29 | Disposition: A | Discharge status: Home / Self Care | Payer: Self-pay | Attending: Emergency Medicine | Admitting: Emergency Medicine

## 2012-06-29 ENCOUNTER — Encounter (HOSPITAL_COMMUNITY): Payer: Self-pay | Admitting: *Deleted

## 2012-06-29 ENCOUNTER — Emergency Department (HOSPITAL_COMMUNITY): Payer: Self-pay

## 2012-06-29 DIAGNOSIS — J40 Bronchitis, not specified as acute or chronic: Secondary | ICD-10-CM

## 2012-06-29 DIAGNOSIS — F411 Generalized anxiety disorder: Secondary | ICD-10-CM | POA: Insufficient documentation

## 2012-06-29 DIAGNOSIS — F17200 Nicotine dependence, unspecified, uncomplicated: Secondary | ICD-10-CM | POA: Insufficient documentation

## 2012-06-29 DIAGNOSIS — J029 Acute pharyngitis, unspecified: Secondary | ICD-10-CM | POA: Insufficient documentation

## 2012-06-29 DIAGNOSIS — Z862 Personal history of diseases of the blood and blood-forming organs and certain disorders involving the immune mechanism: Secondary | ICD-10-CM | POA: Insufficient documentation

## 2012-06-29 DIAGNOSIS — R0602 Shortness of breath: Secondary | ICD-10-CM | POA: Insufficient documentation

## 2012-06-29 DIAGNOSIS — R197 Diarrhea, unspecified: Secondary | ICD-10-CM | POA: Insufficient documentation

## 2012-06-29 DIAGNOSIS — J3489 Other specified disorders of nose and nasal sinuses: Secondary | ICD-10-CM | POA: Insufficient documentation

## 2012-06-29 DIAGNOSIS — Z8639 Personal history of other endocrine, nutritional and metabolic disease: Secondary | ICD-10-CM | POA: Insufficient documentation

## 2012-06-29 DIAGNOSIS — IMO0001 Reserved for inherently not codable concepts without codable children: Secondary | ICD-10-CM | POA: Insufficient documentation

## 2012-06-29 DIAGNOSIS — J209 Acute bronchitis, unspecified: Secondary | ICD-10-CM | POA: Insufficient documentation

## 2012-06-29 DIAGNOSIS — R52 Pain, unspecified: Secondary | ICD-10-CM | POA: Insufficient documentation

## 2012-06-29 DIAGNOSIS — I10 Essential (primary) hypertension: Secondary | ICD-10-CM | POA: Insufficient documentation

## 2012-06-29 DIAGNOSIS — Z79899 Other long term (current) drug therapy: Secondary | ICD-10-CM | POA: Insufficient documentation

## 2012-06-29 DIAGNOSIS — F3289 Other specified depressive episodes: Secondary | ICD-10-CM | POA: Insufficient documentation

## 2012-06-29 DIAGNOSIS — F329 Major depressive disorder, single episode, unspecified: Secondary | ICD-10-CM | POA: Insufficient documentation

## 2012-06-29 MED ORDER — ALBUTEROL SULFATE HFA 108 (90 BASE) MCG/ACT IN AERS
2.0000 | INHALATION_SPRAY | Freq: Once | RESPIRATORY_TRACT | Status: DC
  Filled 2012-06-29: qty 6.7

## 2012-06-29 MED ORDER — AEROCHAMBER PLUS FLO-VU MEDIUM MISC: 1.0000 | Freq: Once | Status: DC

## 2012-06-29 MED ORDER — TRAMADOL HCL 50 MG PO TABS: 100.0000 mg | ORAL_TABLET | Freq: Four times a day (QID) | ORAL | Status: DC | PRN

## 2012-06-29 MED ORDER — AMOXICILLIN 500 MG PO CAPS: 500.0000 mg | ORAL_CAPSULE | Freq: Three times a day (TID) | ORAL | Status: DC

## 2012-06-29 MED ORDER — PREDNISONE 20 MG PO TABS: ORAL_TABLET | ORAL | Status: DC

## 2012-06-29 NOTE — ED Notes (Signed)
Generalized body aches and cough with sob x 4 days.

## 2012-06-29 NOTE — ED Provider Notes (Signed)
History    This chart was scribed for Ward Givens, MD by Donne Anon, ED Scribe. This patient was seen in room Room/bed info not found and the patient's care was started at 1613.  CSN: [id number] Arrival date & time 06/29/12  1513  First MD Initiated Contact with Patient 06/29/12 1613     Chief Complaint  Patient presents with  . Generalized Body Aches  . Cough    The history is provided by the patient. No language interpreter was used.   HPI Comments: Tarry Blayney Pedraza is a 42 y.o. female who presents to the Emergency Department complaining of 4 days of gradual onset, gradually worsening, constant congestion. She reports she has associated non productive cough, mild SOB, hoarseness, subjective fever, sore throat initially now gone, generalized body aches, diarrhea (5 episodes daily), and wheezing. She has tried Muscinex with little relief. She reports she had bronchitis once before, and this feels similar. She used an inhaler with the bronchitis but not currently.  She denies rhinorrhea, nausea, vomiting or any other pain. She denies exposures to sick contacts.   [facility] is her PCP.   She smokes but denies alcohol use.   Past Medical History  Diagnosis Date  . Hypertension   . High cholesterol   . Anxiety   . Depression    Past Surgical History  Procedure Laterality Date  . Cholecystectomy    . Elbow surgery  left   Family History  Problem Relation Age of Onset  . Hypertension Brother    History  Substance Use Topics  . Smoking status: Current Every Day Smoker -- 0.50 packs/day for 35 years    Types: Cigarettes  . Smokeless tobacco: Not on file  . Alcohol Use: No  unemployed, applying for SSI    OB History   Grav Para Term Preterm Abortions TAB SAB Ect Mult Living   3 1   2           Review of Systems  HENT: Positive for congestion and sore throat. Negative for rhinorrhea.   Respiratory: Positive for cough and shortness of breath.    Gastrointestinal: Positive for diarrhea. Negative for nausea and vomiting.  Musculoskeletal: Positive for myalgias.  All other systems reviewed and are negative.    Allergies  Codeine  Home Medications   Current Outpatient Rx  Name  Route  Sig  Dispense  Refill  . ALPRAZolam (XANAX) 1 MG tablet   Oral   Take 1 mg by mouth 3 (three) times daily as needed. anxiety         . cephALEXin (KEFLEX) 250 MG capsule   Oral   Take 1 capsule (250 mg total) by mouth 4 (four) times daily.   28 capsule   0   . citalopram (CELEXA) 40 MG tablet   Oral   Take 40 mg by mouth daily.           . fish oil-omega-3 fatty acids 1000 MG capsule   Oral   Take 1 g by mouth daily.          Marland Kitchen HYDROcodone-acetaminophen (NORCO/VICODIN) 5-325 MG per tablet   Oral   Take 1 tablet by mouth every 4 (four) hours as needed.   10 tablet   0   . EXPIRED: lisinopril-hydrochlorothiazide (PRINZIDE,ZESTORETIC) 20-12.5 MG per tablet   Oral   Take 1 tablet by mouth every morning.         . phenazopyridine (PYRIDIUM) 200 MG tablet  Oral   Take 1 tablet (200 mg total) by mouth 3 (three) times daily.   6 tablet   0   . verapamil (CALAN) 80 MG tablet   Oral   Take 80 mg by mouth 2 (two) times daily.          BP 132/86  Pulse 86  Temp(Src) 97.4 F (36.3 C) (Oral)  Resp 20  Ht 5\' 2"  (1.575 m)  Wt 165 lb (74.844 kg)  BMI 30.17 kg/m2  SpO2 99%  LMP 06/18/2012  Vital signs normal    Physical Exam  Nursing note and vitals reviewed. Constitutional: She is oriented to person, place, and time. She appears well-developed and well-nourished.  Non-toxic appearance. She does not appear ill. No distress.  HENT:  Head: Normocephalic and atraumatic.  Right Ear: External ear normal.  Left Ear: External ear normal.  Nose: Nose normal. No mucosal edema or rhinorrhea.  Mouth/Throat: Oropharynx is clear and moist and mucous membranes are normal. No dental abscesses or edematous.  Eyes: Conjunctivae  and EOM are normal. Pupils are equal, round, and reactive to light.  Neck: Normal range of motion and full passive range of motion without pain. Neck supple.  Cardiovascular: Normal rate, regular rhythm and normal heart sounds.  Exam reveals no gallop and no friction rub.   No murmur heard. Pulmonary/Chest: Effort normal and breath sounds normal. No respiratory distress. She has no wheezes. She has no rhonchi. She has no rales. She exhibits no tenderness and no crepitus.  Abdominal: Soft. Normal appearance and bowel sounds are normal. She exhibits no distension. There is no tenderness. There is no rebound and no guarding.  Musculoskeletal: Normal range of motion. She exhibits no edema and no tenderness.  Moves all extremities well.   Neurological: She is alert and oriented to person, place, and time. She has normal strength. No cranial nerve deficit.  Skin: Skin is warm, dry and intact. No rash noted. No erythema. No pallor.  Psychiatric: She has a normal mood and affect. Her speech is normal and behavior is normal. Her mood appears not anxious.    ED Course  Procedures (including critical care time)  Medications  albuterol (PROVENTIL HFA;VENTOLIN HFA) 108 (90 BASE) MCG/ACT inhaler 2 puff (not administered)  AEROCHAMBER PLUS FLO-VU MEDIUM device MISC 1 each (not administered)    DIAGNOSTIC STUDIES: Oxygen Saturation is 99% on RA, normal by my interpretation.    COORDINATION OF CARE: 4:51 PM Discussed treatment plan which includes an inhaler and antibiotic with pt at bedside and pt agreed to plan. Advised pt to use ibuprofen and acetaminophen for pain relief.   Dg Chest 2 View  06/29/2012   *RADIOLOGY REPORT*  Clinical Data: Cough  CHEST - 2 VIEW  Comparison: 12/12/2011  Findings: The heart and pulmonary vascularity are within normal limits.  The lungs are clear bilaterally.  No acute bony abnormality is seen.  IMPRESSION: No acute abnormality noted.   Original Report Authenticated By:  Alcide Clever, M.D.     1. Bronchitis     New Prescriptions   AMOXICILLIN (AMOXIL) 500 MG CAPSULE    Take 1 capsule (500 mg total) by mouth 3 (three) times daily.   PREDNISONE (DELTASONE) 20 MG TABLET    Take 3 po QD x 3d , then 2 po QD x 3d then 1 po QD x 3d   TRAMADOL (ULTRAM) 50 MG TABLET    Take 2 tablets (100 mg total) by mouth every 6 (six) hours as needed.  Plan discharge   Devoria Albe, MD, FACEP     MDM  I personally performed the services described in this documentation, which was scribed in my presence. The recorded information has been reviewed and considered.  Devoria Albe, MD, Armando Gang    Ward Givens, MD 06/29/12 1728

## 2012-10-19 ENCOUNTER — Encounter (HOSPITAL_COMMUNITY): Payer: Self-pay | Admitting: Emergency Medicine

## 2012-10-19 ENCOUNTER — Emergency Department (HOSPITAL_COMMUNITY)
Admission: EM | Admit: 2012-10-19 | Discharge: 2012-10-19 | Disposition: A | Discharge status: Home / Self Care | Payer: Self-pay | Attending: Emergency Medicine | Admitting: Emergency Medicine

## 2012-10-19 ENCOUNTER — Emergency Department (HOSPITAL_COMMUNITY): Payer: Self-pay

## 2012-10-19 DIAGNOSIS — F3289 Other specified depressive episodes: Secondary | ICD-10-CM | POA: Insufficient documentation

## 2012-10-19 DIAGNOSIS — R062 Wheezing: Secondary | ICD-10-CM | POA: Insufficient documentation

## 2012-10-19 DIAGNOSIS — Z862 Personal history of diseases of the blood and blood-forming organs and certain disorders involving the immune mechanism: Secondary | ICD-10-CM | POA: Insufficient documentation

## 2012-10-19 DIAGNOSIS — Z8639 Personal history of other endocrine, nutritional and metabolic disease: Secondary | ICD-10-CM | POA: Insufficient documentation

## 2012-10-19 DIAGNOSIS — I10 Essential (primary) hypertension: Secondary | ICD-10-CM | POA: Insufficient documentation

## 2012-10-19 DIAGNOSIS — Z79899 Other long term (current) drug therapy: Secondary | ICD-10-CM | POA: Insufficient documentation

## 2012-10-19 DIAGNOSIS — R0789 Other chest pain: Secondary | ICD-10-CM | POA: Insufficient documentation

## 2012-10-19 DIAGNOSIS — R079 Chest pain, unspecified: Secondary | ICD-10-CM

## 2012-10-19 DIAGNOSIS — F329 Major depressive disorder, single episode, unspecified: Secondary | ICD-10-CM | POA: Insufficient documentation

## 2012-10-19 DIAGNOSIS — F17200 Nicotine dependence, unspecified, uncomplicated: Secondary | ICD-10-CM | POA: Insufficient documentation

## 2012-10-19 DIAGNOSIS — F411 Generalized anxiety disorder: Secondary | ICD-10-CM | POA: Insufficient documentation

## 2012-10-19 LAB — BASIC METABOLIC PANEL
BUN: 5 mg/dL — ABNORMAL LOW (ref 6–23)
CO2: 19 mEq/L (ref 19–32)
Chloride: 100 mEq/L (ref 96–112)
GFR calc non Af Amer: >90 mL/min (ref >90)
Glucose, Bld: 122 mg/dL — ABNORMAL HIGH (ref 70–99)
Potassium: 3.8 mEq/L (ref 3.5–5.1)
Sodium: 133 mEq/L — ABNORMAL LOW (ref 135–145)

## 2012-10-19 LAB — POCT I-STAT TROPONIN I
Troponin i, poc: 0.01 ng/mL (ref 0.00–0.08)
Troponin i, poc: 0 ng/mL (ref 0.00–0.08)

## 2012-10-19 LAB — CBC
HCT: 44.4 % (ref 36.0–46.0)
Hemoglobin: 15.4 g/dL — ABNORMAL HIGH (ref 12.0–15.0)
MCV: 95.3 fL (ref 78.0–100.0)
Platelets: 252 10*3/uL (ref 150–400)
RBC: 4.66 MIL/uL (ref 3.87–5.11)
WBC: 11.4 10*3/uL — ABNORMAL HIGH (ref 4.0–10.5)

## 2012-10-19 MED ORDER — GI COCKTAIL ~~LOC~~
30.0000 mL | Freq: Once | ORAL | Status: AC
  Administered 2012-10-19: 30 mL via ORAL
  Filled 2012-10-19: qty 30

## 2012-10-19 MED ORDER — ASPIRIN 81 MG PO CHEW
324.0000 mg | CHEWABLE_TABLET | Freq: Once | ORAL | Status: AC
  Administered 2012-10-19: 324 mg via ORAL
  Filled 2012-10-19: qty 4

## 2012-10-19 NOTE — ED Provider Notes (Signed)
CSN: [id number]     Arrival date & time 10/19/12  1414 History   First MD Initiated Contact with Patient 10/19/12 1456     Chief Complaint  Patient presents with  . Chest Pain   (Consider location/radiation/quality/duration/timing/severity/associated sxs/prior Treatment) HPI  This a 42 year old female with a history of hypertension, hypercholesterolemia, and smoking who presents with chest pain. The patient reports one month history of chest pain that starts in her anterior chest and radiates down her left arm. She states it comes when she's stressed out anxious feeling. She reports improvement of symptoms when she takes Xanax. Patient denies any association with exertion or food. She denies any shortness of breath. Patient reports the pain is sharp in nature. Last episode pain was this morning at approximately noon. Currently her pain is 0/10. No early family history of heart disease. Patient denies any history of cancer, recent hospitalization, history of blood clots, estrogen use.  Past Medical History  Diagnosis Date  . Hypertension   . High cholesterol   . Anxiety   . Depression    Past Surgical History  Procedure Laterality Date  . Cholecystectomy    . Elbow surgery  left   Family History  Problem Relation Age of Onset  . Hypertension Brother    History  Substance Use Topics  . Smoking status: Current Every Day Smoker -- 0.50 packs/day for 35 years    Types: Cigarettes  . Smokeless tobacco: Not on file  . Alcohol Use: No   OB History   Grav Para Term Preterm Abortions TAB SAB Ect Mult Living   3 1   2           Review of Systems  Constitutional: Negative for fever.  Respiratory: Positive for chest tightness. Negative for cough and shortness of breath.   Cardiovascular: Negative for chest pain and leg swelling.  Gastrointestinal: Negative for nausea, vomiting and abdominal pain.  Genitourinary: Negative for dysuria.  Skin: Negative for wound.  Neurological:  Negative for headaches.  Psychiatric/Behavioral:       Anxiety  All other systems reviewed and are negative.    Allergies  Codeine  Home Medications   Current Outpatient Rx  Name  Route  Sig  Dispense  Refill  . ALPRAZolam (XANAX) 0.5 MG tablet   Oral   Take 0.5 mg by mouth 3 (three) times daily as needed for anxiety.         . Ca Carbonate-Mag Hydroxide (ROLAIDS PO)   Oral   Take 2 tablets by mouth daily as needed (heartburn).         . citalopram (CELEXA) 40 MG tablet   Oral   Take 40 mg by mouth daily.           . cloNIDine (CATAPRES) 0.1 MG tablet   Oral   Take 0.1 mg by mouth 2 (two) times daily.         Marland Kitchen lisinopril-hydrochlorothiazide (PRINZIDE,ZESTORETIC) 20-25 MG per tablet   Oral   Take 1 tablet by mouth daily.         . traZODone (DESYREL) 100 MG tablet   Oral   Take 100 mg by mouth at bedtime.         . verapamil (CALAN) 80 MG tablet   Oral   Take 80 mg by mouth 2 (two) times daily.          BP 169/94  Pulse 89  Temp(Src) 98.2 F (36.8 C) (Oral)  Resp 20  SpO2  97%  LMP 09/18/2012 Physical Exam  Nursing note and vitals reviewed. Constitutional: She is oriented to person, place, and time. She appears well-developed and well-nourished. No distress.  HENT:  Head: Normocephalic and atraumatic.  Mouth/Throat: Oropharynx is clear and moist.  Eyes: Pupils are equal, round, and reactive to light.  Neck: Neck supple.  Cardiovascular: Normal rate, regular rhythm and normal heart sounds.   Pulmonary/Chest: Effort normal. No respiratory distress. She has wheezes.  Scant exp wheezing  Abdominal: Soft. Bowel sounds are normal. There is no tenderness.  Musculoskeletal: She exhibits no edema.  Neurological: She is alert and oriented to person, place, and time.  Skin: Skin is warm and dry.  Psychiatric: She has a normal mood and affect.    ED Course  Procedures (including critical care time) Labs Review Labs Reviewed  CBC - Abnormal;  Notable for the following:    WBC 11.4 (*)    Hemoglobin 15.4 (*)    All other components within normal limits  BASIC METABOLIC PANEL - Abnormal; Notable for the following:    Sodium 133 (*)    Glucose, Bld 122 (*)    BUN 5 (*)    All other components within normal limits  POCT I-STAT TROPONIN I  POCT I-STAT TROPONIN I   Imaging Review Dg Chest 2 View  10/19/2012   CLINICAL DATA:  Chest and left arm pain intermittently for 1 month, history of smoking  EXAM: CHEST  2 VIEW  COMPARISON:  Chest x-ray of 06/29/2012  FINDINGS: No active infiltrate or effusion is seen. Mediastinal contours appear normal. Minimal peribronchial thickening is noted. The heart is within normal limits in size. No bony abnormality is seen. Surgical clips are present in the right upper quadrant from prior cholecystectomy.  IMPRESSION: No pneumonia or effusion. Mild peribronchial thickening.   Electronically Signed   By: Dwyane Dee M.D.   On: 10/19/2012 16:40    EKG Interpretation     Ventricular Rate:  96 PR Interval:  134 QRS Duration: 76 QT Interval:  338 QTC Calculation: 427 R Axis:   3 Text Interpretation:  Normal sinus rhythm T wave inversion in lead III Poor R wave progression Low voltage QRS Abnormal ECG            MDM   1. Chest pain    This is a 42 year old female who presents with one-month chest pain. Her last chest pain episode started at approximately noon today. She is denying any pain at this time. Chest pain is somewhat atypical for ACS and tends to happen when she is stressed. However, the patient does have risk factors including hypertension, hyperlipidemia and current smoking. EKG shows no evidence of acute ischemia. Chest x-ray is reassuring. Patient had 2 sets of troponins that were 2 hours that are negative. Patient continued to be chest pain-free in the emergency room. She is PERC negative and vital signs remained stable. She has a TIMI risk score of 1.  Given reassuring workup and  negative delta set of troponins, feels that patient can be discharged home with outpatient stress testing. Patient was instructed to [facility] for stress testing as soon as possible. Patient was given strict return precautions.  After history, exam, and medical workup I feel the patient has been appropriately medically screened and is safe for discharge home. Pertinent diagnoses were discussed with the patient. Patient was given return precautions.    Shon Baton, MD 10/19/12 2112

## 2012-10-19 NOTE — ED Notes (Signed)
Pt states that for the past month when she wakes up she has mid- sternal chest pain that radiates down her left arm- pt states this has been happening every morning when she wakes up.  Pt states that her mental health doctor has been reducing her anxiety medication and she thinks that may have something to do with it.  NSR on monitor- denies shortness of breath.

## 2012-10-19 NOTE — ED Notes (Signed)
For one month pt has had daily episodes of CP that are in central chest and radiate to left arm.  Pt states that when she "takes xanax it goes away" but she was taken off of xanax and now she doesn't have any left.  Pt states that the pain gets worse when she gets stressed or anxious and it gets better when taking xanax and when laying down, putting legs up and relaxing it goes away.

## 2012-11-01 ENCOUNTER — Encounter: Payer: Self-pay | Admitting: Cardiovascular Disease

## 2012-11-01 ENCOUNTER — Encounter: Payer: Self-pay | Admitting: *Deleted

## 2012-11-01 ENCOUNTER — Ambulatory Visit (INDEPENDENT_AMBULATORY_CARE_PROVIDER_SITE_OTHER): Payer: Self-pay | Admitting: Cardiovascular Disease

## 2012-11-01 VITALS — BP 136/96 | HR 93 | Ht 62.0 in | Wt 170.8 lb

## 2012-11-01 DIAGNOSIS — R079 Chest pain, unspecified: Secondary | ICD-10-CM

## 2012-11-01 DIAGNOSIS — I10 Essential (primary) hypertension: Secondary | ICD-10-CM

## 2012-11-01 DIAGNOSIS — E785 Hyperlipidemia, unspecified: Secondary | ICD-10-CM | POA: Insufficient documentation

## 2012-11-01 DIAGNOSIS — F410 Panic disorder [episodic paroxysmal anxiety] without agoraphobia: Secondary | ICD-10-CM

## 2012-11-01 MED ORDER — VERAPAMIL HCL 80 MG PO TABS: 80.0000 mg | ORAL_TABLET | Freq: Two times a day (BID) | ORAL | Status: DC

## 2012-11-01 MED ORDER — LISINOPRIL-HYDROCHLOROTHIAZIDE 20-25 MG PO TABS: 1.0000 | ORAL_TABLET | Freq: Every day | ORAL | Status: DC

## 2012-11-01 NOTE — Patient Instructions (Signed)
Your physician recommends that you schedule a follow-up appointment in: 4-6 weeks. Your physician recommends that you continue on your current medications as directed. Please refer to the Current Medication list given to you today. Your physician has requested that you have an echocardiogram. Echocardiography is a painless test that uses sound waves to create images of your heart. It provides your doctor with information about the size and shape of your heart and how well your heart's chambers and valves are working. This procedure takes approximately one hour. There are no restrictions for this procedure. Your physician has requested that you have a stress echocardiogram. For further information please visit [email or web address] Please follow instruction sheet as given. Your physician recommends that you have a FASTING lipid profile. Please have this done at [facility].

## 2012-11-01 NOTE — Progress Notes (Signed)
Patient ID: Beth Sanford, female   DOB: 08-15-1970, 41 y.o.   MRN: [id number]       CARDIOLOGY CONSULT NOTE  Patient ID: Beth Sanford MRN: [id number] DOB/AGE: 11/05/1970 42 y.o.  Admit date: (Not on file) Primary Physician No PCP Per Patient  Reason for Consultation: chest pain  HPI: Beth Sanford is a 42 year old female with a history of hypertension, hypercholesterolemia, anxiety disorder with panic attacks, and tobacco use who presented with chest pain to the ED recently. A troponin was normal and her ECG was unremarkable. She reports a one month history of chest pain that starts in her anterior chest and radiates down her left arm. She states it comes when she's stressed out and feeling anxious. She reports improvement of symptoms when she takes Xanax, which she has been weaned down on recently. She has associated shortness of breath, palpitations, and diaphoresis.  She has been out of her medications for the past 2 days which includes her antihypertensives. She has not been on a statin for a very long time, and does carry a diagnosis of hyperlipidemia. She denies any association with exertion or food.  She denies a family history of premature heart disease.  She is here with her mother.  Soc: smokes 0.5 ppd. FamHx: no premature CAD.    Allergies  Allergen Reactions  . Codeine Itching and Nausea Only    Current Outpatient Prescriptions  Medication Sig Dispense Refill  . ALPRAZolam (XANAX) 0.5 MG tablet Take 0.5 mg by mouth 3 (three) times daily as needed for anxiety.      . Ca Carbonate-Mag Hydroxide (ROLAIDS PO) Take 2 tablets by mouth daily as needed (heartburn).      . gabapentin (NEURONTIN) 100 MG capsule Take 100 mg by mouth 3 (three) times daily. Takes prn      . lisinopril-hydrochlorothiazide (PRINZIDE,ZESTORETIC) 20-25 MG per tablet Take 1 tablet by mouth daily.      . traZODone (DESYREL) 100 MG tablet Take 100 mg by mouth at bedtime.      . verapamil (CALAN) 80 MG  tablet Take 80 mg by mouth 2 (two) times daily.      . citalopram (CELEXA) 40 MG tablet Take 40 mg by mouth daily.         No current facility-administered medications for this visit.    Past Medical History  Diagnosis Date  . Hypertension   . High cholesterol   . Anxiety   . Depression     Past Surgical History  Procedure Laterality Date  . Cholecystectomy    . Elbow surgery  left    History   Social History  . Marital Status: Single    Spouse Name: N/A    Number of Children: N/A  . Years of Education: N/A   Occupational History  . Not on file.   Social History Main Topics  . Smoking status: Current Every Day Smoker -- 0.50 packs/day for 35 years    Types: Cigarettes  . Smokeless tobacco: Not on file  . Alcohol Use: No  . Drug Use: No  . Sexual Activity: Yes    Birth Control/ Protection: None   Other Topics Concern  . Not on file   Social History Narrative  . No narrative on file     Family History  Problem Relation Age of Onset  . Hypertension Brother      Prior to Admission medications   Medication Sig Start Date End Date Taking? Authorizing Provider  ALPRAZolam (  XANAX) 0.5 MG tablet Take 0.5 mg by mouth 3 (three) times daily as needed for anxiety.   Yes Historical Provider, MD  Ca Carbonate-Mag Hydroxide (ROLAIDS PO) Take 2 tablets by mouth daily as needed (heartburn).   Yes Historical Provider, MD  gabapentin (NEURONTIN) 100 MG capsule Take 100 mg by mouth 3 (three) times daily. Takes prn   Yes Historical Provider, MD  lisinopril-hydrochlorothiazide (PRINZIDE,ZESTORETIC) 20-25 MG per tablet Take 1 tablet by mouth daily.   Yes Historical Provider, MD  traZODone (DESYREL) 100 MG tablet Take 100 mg by mouth at bedtime.   Yes Historical Provider, MD  verapamil (CALAN) 80 MG tablet Take 80 mg by mouth 2 (two) times daily.   Yes Historical Provider, MD  citalopram (CELEXA) 40 MG tablet Take 40 mg by mouth daily.      Historical Provider, MD     Review  of systems complete and found to be negative unless listed above in HPI     Physical exam Blood pressure 136/96, pulse 93, height 5\' 2"  (1.575 m), weight 170 lb 12 oz (77.452 kg), last menstrual period 09/18/2012. General: NAD, obese Neck: No JVD, no thyromegaly or thyroid nodule.  Lungs: Clear to auscultation bilaterally with normal respiratory effort. CV: Nondisplaced PMI.  Heart regular S1/S2, no S3/S4, I/VI pansystolic murmur at RUSB and LUSB.  No peripheral edema.  No carotid bruit.  Normal pedal pulses.  Abdomen: Soft, nontender, no hepatosplenomegaly, no distention.  Skin: Intact without lesions or rashes.  Neurologic: Alert and oriented x 3.  Psych: Normal affect. Extremities: No clubbing or cyanosis.  HEENT: Normal.   Labs:   Lab Results  Component Value Date   WBC 11.4* 10/19/2012   HGB 15.4* 10/19/2012   HCT 44.4 10/19/2012   MCV 95.3 10/19/2012   PLT 252 10/19/2012   No results found for this basename: NA, K, CL, CO2, BUN, CREATININE, CALCIUM, LABALBU, PROT, BILITOT, ALKPHOS, ALT, AST, GLUCOSE,  in the last 168 hours No results found for this basename: CKTOTAL, CKMB, CKMBINDEX, TROPONINI    No results found for this basename: CHOL   No results found for this basename: HDL   No results found for this basename: LDLCALC   No results found for this basename: TRIG   No results found for this basename: CHOLHDL   No results found for this basename: LDLDIRECT       EKG: Sinus rhythm, rate 96 bpm, sinus arrhythmia  Studies: Chest xray showed peribronchial thickening   ASSESSMENT AND PLAN: 1. Chest pain: as her symptoms appear to be provoked by anxiety and relieved with Xanax, and by lying down and keeping her legs elevated, they seem to be consistent with panic attacks. She does have risk factors for CAD which include tobacco use, HTN, and hyperlipidemia. I will obtain a 2-D echocardiogram with Doppler to evaluate for structural heart disease, and a stress echo  to evaluate for inducible ischemia. 2. HTN: elevated diastolic reading today, but off of her antihypertensive regimen. I will refill her lisinopril-HCTZ and her verapamil. 3. Hyperlipidemia: will check lipids, as she likely needs to restart medical therapy.  Signed: Prentice Docker, M.D., F.A.C.C.  11/01/2012, 1:53 PM

## 2012-11-16 ENCOUNTER — Ambulatory Visit (HOSPITAL_COMMUNITY)
Admission: RE | Admit: 2012-11-16 | Discharge: 2012-11-16 | Disposition: A | Discharge status: Home / Self Care | Payer: Self-pay | Source: Ambulatory Visit | Attending: Cardiovascular Disease | Admitting: Cardiovascular Disease

## 2012-11-16 ENCOUNTER — Encounter (HOSPITAL_COMMUNITY): Payer: Self-pay

## 2012-11-16 DIAGNOSIS — I517 Cardiomegaly: Secondary | ICD-10-CM

## 2012-11-16 DIAGNOSIS — R079 Chest pain, unspecified: Secondary | ICD-10-CM

## 2012-11-16 DIAGNOSIS — I10 Essential (primary) hypertension: Secondary | ICD-10-CM | POA: Insufficient documentation

## 2012-11-16 DIAGNOSIS — Z6831 Body mass index (BMI) 31.0-31.9, adult: Secondary | ICD-10-CM | POA: Insufficient documentation

## 2012-11-16 DIAGNOSIS — R072 Precordial pain: Secondary | ICD-10-CM

## 2012-11-16 DIAGNOSIS — E785 Hyperlipidemia, unspecified: Secondary | ICD-10-CM | POA: Insufficient documentation

## 2012-11-16 NOTE — Progress Notes (Signed)
Stress Lab Nurses Notes - Beth Sanford 11/16/2012 Reason for doing test: Chest Pain and HTN Type of test: Stress Echo Nurse performing test: Parke Poisson, RN Nuclear Medicine Tech: Not Applicable Echo Tech: Veda Canning MD performing test: Branch / K. Lawrence NP Family MD: NPCP Test explained and consent signed: yes IV started: No IV started Symptoms: SOB & fatigue in legs Treatment/Intervention: None Reason test stopped: fatigue and SOB After recovery IV was: NA Patient to return to Nuc. Med at : NA Patient discharged: Home Patient's Condition upon discharge was: stable Comments: During test peak BP 171/116 & HR 123.  Recovery BP 148/100 & HR 87.  Symptoms resolved in recovery. Erskine Speed T

## 2012-11-16 NOTE — Progress Notes (Signed)
*  PRELIMINARY RESULTS* Echocardiogram Echocardiogram Stress Test has been performed.  Beth Sanford 11/16/2012, 12:10 PM

## 2012-11-16 NOTE — Progress Notes (Signed)
*  PRELIMINARY RESULTS* Echocardiogram 2D Echocardiogram has been performed.  Beth Sanford 11/16/2012, 11:14 AM

## 2012-11-23 ENCOUNTER — Telehealth: Payer: Self-pay | Admitting: *Deleted

## 2012-11-23 NOTE — Telephone Encounter (Signed)
Pt results for echo and stress echo were discussed with the pt mother yesterday 11-22-12 via HD LPN however the pt wanted to also Leinen back to confirm what her mother advised, pt understood and will keep apt to discuss further details on 12-27-12 at 9am, pt requested Dr. Purvis Sheffield fill her anxiety medication per pt does not have a PCP, advised pt per protocol our office only fills cardiac related medications, advised the pt to contact the free clinic or health dept to establish, pt understood and will [place] our office back with any concerns if noted

## 2012-12-25 LAB — LIPID PANEL
Cholesterol: 207 mg/dL — ABNORMAL HIGH (ref 0–200)
HDL: 44 mg/dL (ref >39)
LDL Cholesterol: 121 mg/dL — ABNORMAL HIGH (ref 0–99)
VLDL: 42 mg/dL — ABNORMAL HIGH (ref 0–40)

## 2012-12-27 ENCOUNTER — Ambulatory Visit (INDEPENDENT_AMBULATORY_CARE_PROVIDER_SITE_OTHER): Payer: Self-pay | Admitting: Cardiovascular Disease

## 2012-12-27 ENCOUNTER — Encounter: Payer: Self-pay | Admitting: Cardiovascular Disease

## 2012-12-27 VITALS — BP 103/79 | HR 87 | Ht 62.0 in | Wt 177.0 lb

## 2012-12-27 DIAGNOSIS — E785 Hyperlipidemia, unspecified: Secondary | ICD-10-CM

## 2012-12-27 DIAGNOSIS — Z716 Tobacco abuse counseling: Secondary | ICD-10-CM

## 2012-12-27 DIAGNOSIS — F410 Panic disorder [episodic paroxysmal anxiety] without agoraphobia: Secondary | ICD-10-CM

## 2012-12-27 DIAGNOSIS — R079 Chest pain, unspecified: Secondary | ICD-10-CM

## 2012-12-27 DIAGNOSIS — Z7189 Other specified counseling: Secondary | ICD-10-CM

## 2012-12-27 DIAGNOSIS — F17200 Nicotine dependence, unspecified, uncomplicated: Secondary | ICD-10-CM

## 2012-12-27 DIAGNOSIS — I10 Essential (primary) hypertension: Secondary | ICD-10-CM

## 2012-12-27 MED ORDER — SIMVASTATIN 20 MG PO TABS: 20.0000 mg | ORAL_TABLET | Freq: Every day | ORAL | Status: DC

## 2012-12-27 NOTE — Progress Notes (Signed)
Patient ID: Beth Sanford, female   DOB: 01-11-1970, 42 y.o.   MRN: [id number]      SUBJECTIVE: The patient is here to followup on the results of cardiovascular testing. Her echocardiogram revealed normal left ventricle systolic function, EF 60-65%, moderate left ventricular hypertrophy, and grade 1 diastolic dysfunction. A stress echocardiogram showed a reduced functional capacity for age and gender (70%), but she had no evidence of inducible ischemia with a hypercontractile response to stress. She continues to smoke but is now trying electronic cigarettes. She gets very anxious and gets chest pain which radiates down her left arm. This is been going on for some time and has been relieved with Xanax in the past. She is due to see Dr. Sudie Bailey, a primary care physician, in early January.    Allergies  Allergen Reactions  . Codeine Itching and Nausea Only    Current Outpatient Prescriptions  Medication Sig Dispense Refill  . ALPRAZolam (XANAX) 0.5 MG tablet Take 0.5 mg by mouth 3 (three) times daily as needed for anxiety.      . Ca Carbonate-Mag Hydroxide (ROLAIDS PO) Take 2 tablets by mouth daily as needed (heartburn).      Marland Kitchen lisinopril-hydrochlorothiazide (PRINZIDE,ZESTORETIC) 20-25 MG per tablet Take 1 tablet by mouth daily.  30 tablet  3  . traZODone (DESYREL) 100 MG tablet Take 100 mg by mouth at bedtime.      . verapamil (CALAN) 80 MG tablet Take 1 tablet (80 mg total) by mouth 2 (two) times daily.  60 tablet  3   No current facility-administered medications for this visit.    Past Medical History  Diagnosis Date  . Hypertension   . High cholesterol   . Anxiety   . Depression     Past Surgical History  Procedure Laterality Date  . Cholecystectomy    . Elbow surgery  left    History   Social History  . Marital Status: Single    Spouse Name: N/A    Number of Children: N/A  . Years of Education: N/A   Occupational History  . Not on file.   Social History Main  Topics  . Smoking status: Current Every Day Smoker -- 0.50 packs/day for 35 years    Types: Cigarettes  . Smokeless tobacco: Not on file  . Alcohol Use: No  . Drug Use: No  . Sexual Activity: Yes    Birth Control/ Protection: None   Other Topics Concern  . Not on file   Social History Narrative  . No narrative on file     Filed Vitals:   12/27/12 0912  BP: 103/79  Pulse: 87  Height: 5\' 2"  (1.575 m)  Weight: 177 lb (80.287 kg)    PHYSICAL EXAM General: NAD Neck: No JVD, no thyromegaly or thyroid nodule.  Lungs: Faint end-expiratory wheezes bilaterally with normal respiratory effort. CV: Nondisplaced PMI.  Heart regular S1/S2, no S3/S4, no murmur.  No peripheral edema.  No carotid bruit.  Normal pedal pulses.  Abdomen: Soft, nontender, no hepatosplenomegaly, no distention.  Neurologic: Alert and oriented x 3.  Psych: Normal affect. Extremities: No clubbing or cyanosis.   ECG: reviewed and available in electronic records.      ASSESSMENT AND PLAN: 1. Chest pain: as her symptoms appear to be provoked by anxiety and relieved with Xanax, and by lying down and keeping her legs elevated, they seem to be consistent with panic attacks. She does have risk factors for CAD which include tobacco use, HTN,  and hyperlipidemia. Given the results of cardiac testing, no further testing is indicated. I will prescribe Xanax (one time only) 0.5 mg prn anxiety daily, and dispense 14 tablets with no refills. 2. HTN: now controlled with lisinopril-HCTZ and her verapamil.  3. Hyperlipidemia: lipids were as follows-TC 207, TG 212, LDL 121, HDL 44. Therapeutic lifestyle changes were recommended and counseling was provided. I will start simvastatin 20 mg daily and check LFT's in 3 weeks and lipids in 3 months.  4. Tobacco abuse: cessation counseling provided.  Dispo: HTN and hyperlipidemia can be managed by her PCP. She can f/u prn.   Prentice Docker, M.D., F.A.C.C.

## 2012-12-27 NOTE — Patient Instructions (Signed)
Your physician recommends that you schedule a follow-up appointment in: As needed  Your physician has recommended you make the following change in your medication:  1. Start Simvastatin 20 mg daily  Your physician recommends that you return for lab work in 3 weeks Jan 14th  You will also have labs in 3 months, we will send you lab slips in the mail when labs are due

## 2013-03-03 ENCOUNTER — Encounter (HOSPITAL_COMMUNITY): Payer: Self-pay | Admitting: Emergency Medicine

## 2013-03-03 ENCOUNTER — Emergency Department (HOSPITAL_COMMUNITY): Payer: Self-pay

## 2013-03-03 ENCOUNTER — Emergency Department (HOSPITAL_COMMUNITY)
Admission: EM | Admit: 2013-03-03 | Discharge: 2013-03-03 | Disposition: A | Discharge status: Home / Self Care | Payer: Self-pay | Attending: Emergency Medicine | Admitting: Emergency Medicine

## 2013-03-03 DIAGNOSIS — S40011A Contusion of right shoulder, initial encounter: Secondary | ICD-10-CM

## 2013-03-03 DIAGNOSIS — S40019A Contusion of unspecified shoulder, initial encounter: Secondary | ICD-10-CM | POA: Insufficient documentation

## 2013-03-03 DIAGNOSIS — IMO0002 Reserved for concepts with insufficient information to code with codable children: Secondary | ICD-10-CM | POA: Insufficient documentation

## 2013-03-03 DIAGNOSIS — F17200 Nicotine dependence, unspecified, uncomplicated: Secondary | ICD-10-CM | POA: Insufficient documentation

## 2013-03-03 DIAGNOSIS — Y9389 Activity, other specified: Secondary | ICD-10-CM | POA: Insufficient documentation

## 2013-03-03 DIAGNOSIS — F3289 Other specified depressive episodes: Secondary | ICD-10-CM | POA: Insufficient documentation

## 2013-03-03 DIAGNOSIS — Y92410 Unspecified street and highway as the place of occurrence of the external cause: Secondary | ICD-10-CM | POA: Insufficient documentation

## 2013-03-03 DIAGNOSIS — F329 Major depressive disorder, single episode, unspecified: Secondary | ICD-10-CM | POA: Insufficient documentation

## 2013-03-03 DIAGNOSIS — E7800 Pure hypercholesterolemia, unspecified: Secondary | ICD-10-CM | POA: Insufficient documentation

## 2013-03-03 DIAGNOSIS — F411 Generalized anxiety disorder: Secondary | ICD-10-CM | POA: Insufficient documentation

## 2013-03-03 DIAGNOSIS — I10 Essential (primary) hypertension: Secondary | ICD-10-CM | POA: Insufficient documentation

## 2013-03-03 MED ORDER — KETOROLAC TROMETHAMINE 60 MG/2ML IM SOLN
60.0000 mg | Freq: Once | INTRAMUSCULAR | Status: AC
  Administered 2013-03-03: 60 mg via INTRAMUSCULAR
  Filled 2013-03-03: qty 2

## 2013-03-03 MED ORDER — DIAZEPAM 5 MG PO TABS
5.0000 mg | ORAL_TABLET | Freq: Once | ORAL | Status: AC
  Administered 2013-03-03: 5 mg via ORAL
  Filled 2013-03-03: qty 1

## 2013-03-03 MED ORDER — DICLOFENAC SODIUM 75 MG PO TBEC: 75.0000 mg | DELAYED_RELEASE_TABLET | Freq: Two times a day (BID) | ORAL | Status: DC

## 2013-03-03 MED ORDER — BACLOFEN 10 MG PO TABS: 10.0000 mg | ORAL_TABLET | Freq: Three times a day (TID) | ORAL | Status: AC

## 2013-03-03 NOTE — ED Provider Notes (Signed)
Medical screening examination/treatment/procedure(s) were performed by non-physician practitioner and as supervising physician I was immediately available for consultation/collaboration.  Gerhard Munchobert Sharryn Belding, MD 03/03/13 1435

## 2013-03-03 NOTE — ED Provider Notes (Signed)
CSN: [id number]     Arrival date & time 03/03/13  1257 History   First MD Initiated Contact with Patient 03/03/13 1312     Chief Complaint  Patient presents with  . Shoulder Pain     (Consider location/radiation/quality/duration/timing/severity/associated sxs/prior Treatment) HPI Comments: Patient is a 43 year old female who presents to the emergency department with complaint of shoulder pain, and abrasions to her knees. The patient states she was at a friend's trailer when she went to the bathroom and" fell to the floor" she is unsure what she may have hit her shoulder on, but states it hurts really really bad. The patient also states that she got a scratch on her right thigh and abrasions of the knees. She is ambulatory, but states that the main pain is in her shoulder. The patient denies being on any anticoagulant medications. The patient has no history of bleeding disorders. There's been no recent operations or procedures involving the right shoulder.  Patient is a 43 y.o. female presenting with shoulder pain. The history is provided by the patient.  Shoulder Pain This is a new problem. The current episode started today. Pertinent negatives include no abdominal pain, arthralgias, chest pain, coughing or neck pain.    Past Medical History  Diagnosis Date  . Hypertension   . High cholesterol   . Anxiety   . Depression    Past Surgical History  Procedure Laterality Date  . Cholecystectomy    . Elbow surgery  left   Family History  Problem Relation Age of Onset  . Hypertension Brother    History  Substance Use Topics  . Smoking status: Current Every Day Smoker -- 0.50 packs/day for 35 years    Types: Cigarettes  . Smokeless tobacco: Not on file  . Alcohol Use: No   OB History   Grav Para Term Preterm Abortions TAB SAB Ect Mult Living   3 1   2           Review of Systems  Constitutional: Negative for activity change.       All ROS Neg except as noted in HPI  HENT:  Negative for nosebleeds.   Eyes: Negative for photophobia and discharge.  Respiratory: Negative for cough, shortness of breath and wheezing.   Cardiovascular: Negative for chest pain and palpitations.  Gastrointestinal: Negative for abdominal pain and blood in stool.  Genitourinary: Negative for dysuria, frequency and hematuria.  Musculoskeletal: Negative for arthralgias, back pain and neck pain.  Skin: Negative.   Neurological: Negative for dizziness, seizures and speech difficulty.  Psychiatric/Behavioral: Negative for hallucinations and confusion. The patient is nervous/anxious.       Allergies  Codeine  Home Medications   Current Outpatient Rx  Name  Route  Sig  Dispense  Refill  . ALPRAZolam (XANAX) 0.5 MG tablet   Oral   Take 0.5 mg by mouth 3 (three) times daily as needed for anxiety.         . Ca Carbonate-Mag Hydroxide (ROLAIDS PO)   Oral   Take 2 tablets by mouth daily as needed (heartburn).         Marland Kitchen lisinopril-hydrochlorothiazide (PRINZIDE,ZESTORETIC) 20-25 MG per tablet   Oral   Take 1 tablet by mouth daily.   30 tablet   3   . simvastatin (ZOCOR) 20 MG tablet   Oral   Take 1 tablet (20 mg total) by mouth at bedtime.   90 tablet   3   . traZODone (DESYREL) 100 MG tablet  Oral   Take 100 mg by mouth at bedtime.         . verapamil (CALAN) 80 MG tablet   Oral   Take 1 tablet (80 mg total) by mouth 2 (two) times daily.   60 tablet   3    BP 146/98  Pulse 91  Temp(Src) 98.2 F (36.8 C) (Oral)  Resp 18  Ht 5' 2.5" (1.588 m)  Wt 177 lb (80.287 kg)  BMI 31.84 kg/m2  SpO2 100%  LMP 03/03/2013 Physical Exam  Nursing note and vitals reviewed. Constitutional: She is oriented to person, place, and time. She appears well-developed and well-nourished.  Non-toxic appearance.  HENT:  Head: Normocephalic and atraumatic.  Right Ear: Tympanic membrane and external ear normal.  Left Ear: Tympanic membrane and external ear normal.  Eyes: EOM and  lids are normal. Pupils are equal, round, and reactive to light.  Neck: Normal range of motion. Neck supple. Carotid bruit is not present.  Cardiovascular: Normal rate, regular rhythm, normal heart sounds, intact distal pulses and normal pulses.   Pulmonary/Chest: Breath sounds normal. No respiratory distress.  Chaperone present during examination. No chest wall tenderness or abrasions.  Abdominal: Soft. Bowel sounds are normal. There is no tenderness. There is no rebound and no guarding.  Musculoskeletal:  There is pain to palpation and attempted range of motion of the right shoulder. There is no clavicle tenderness. There is no scapular dislocation appreciated. The patient is not cooperative for full examination, but I do not feel evidence of a dislocation nor crepitus present. There is good range of motion of the elbow and wrist on the right. The capillary refill is less than 2 seconds. Radial pulse is 2+. There is full range of motion of the left upper extremity.  There are a few shallow abrasions of the right and left knee. 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 Patient has good range of motion of the knees but with mild crepitus present. There's no deformity of the lower legs. There is full range of motion of both ankles.  Lymphadenopathy:       Head (right side): No submandibular adenopathy present.       Head (left side): No submandibular adenopathy present.    She has no cervical adenopathy.  Neurological: She is alert and oriented to person, place, and time. She has normal strength. No cranial nerve deficit or sensory deficit.  Skin: Skin is warm and dry.  Psychiatric: She has a normal mood and affect. Her speech is normal.    ED Course  Procedures (including critical care time) Labs Review Labs Reviewed - No data to display Imaging Review Dg Shoulder Right  03/03/2013   CLINICAL DATA:  Pain post fall.  EXAM: RIGHT SHOULDER - 2+ VIEW  COMPARISON:  Chest x-ray  10/19/2012  FINDINGS: Bones, joint spaces and soft tissues are within normal. There is no fracture or dislocation.  IMPRESSION: No acute findings.   Electronically Signed   By: Elberta Fortis M.D.   On: 03/03/2013 13:59     EKG Interpretation None      MDM Patient presents to the emergency department with shoulder pain after" falling to a floor". The patient sustained some minor abrasions of the knee and a scratch on the right thigh. She complains mostly of right shoulder pain. X-ray of the right shoulder is negative for fracture or dislocation.  The plan at this time is for the patient to have a sling for the  right upper extremity. She will be given a prescription for diclofenac and Robaxin. Patient is referred to Dr. Romeo AppleHarrison for additional evaluation if not improving.    Final diagnoses:  None    *I have reviewed nursing notes, vital signs, and all appropriate lab and imaging results for this patient.Kathie Dike**   Haniel Fix M Wanda Cellucci, PA-C 03/03/13 1420

## 2013-03-03 NOTE — Discharge Instructions (Signed)
The x-ray of your shoulder is negative for fracture or for dislocation. Please use the sling for the next 5 days. Please use medications as ordered. Baclofen may cause drowsiness, please use with caution. Please see the orthopedist listed above if any changes, problems, or concerns. Contusion A contusion is a deep bruise. Contusions happen when an injury causes bleeding under the skin. Signs of bruising include pain, puffiness (swelling), and discolored skin. The contusion may turn blue, purple, or yellow. HOME CARE   Put ice on the injured area.  Put ice in a plastic bag.  Place a towel between your skin and the bag.  Leave the ice on for 15-20 minutes, 03-04 times a day.  Only take medicine as told by your doctor.  Rest the injured area.  If possible, raise (elevate) the injured area to lessen puffiness. GET HELP RIGHT AWAY IF:   You have more bruising or puffiness.  You have pain that is getting worse.  Your puffiness or pain is not helped by medicine. MAKE SURE YOU:   Understand these instructions.  Will watch your condition.  Will get help right away if you are not doing well or get worse. Document Released: 06/09/2007 Document Revised: 03/15/2011 Document Reviewed: 10/26/2010 [facility] Patient Information 2014 [place], [place].

## 2013-03-03 NOTE — ED Notes (Signed)
Pt states she fell through a floor of a trailer 3 hours PTA. Pt states pain to right shoulder. No obvious deformity.

## 2013-03-12 ENCOUNTER — Emergency Department (HOSPITAL_COMMUNITY)
Admission: EM | Admit: 2013-03-12 | Discharge: 2013-03-12 | Disposition: A | Discharge status: Home / Self Care | Payer: No Typology Code available for payment source | Attending: Emergency Medicine | Admitting: Emergency Medicine

## 2013-03-12 ENCOUNTER — Encounter (HOSPITAL_COMMUNITY): Payer: Self-pay | Admitting: Emergency Medicine

## 2013-03-12 DIAGNOSIS — I10 Essential (primary) hypertension: Secondary | ICD-10-CM | POA: Insufficient documentation

## 2013-03-12 DIAGNOSIS — F3289 Other specified depressive episodes: Secondary | ICD-10-CM | POA: Insufficient documentation

## 2013-03-12 DIAGNOSIS — F17200 Nicotine dependence, unspecified, uncomplicated: Secondary | ICD-10-CM | POA: Insufficient documentation

## 2013-03-12 DIAGNOSIS — Z791 Long term (current) use of non-steroidal anti-inflammatories (NSAID): Secondary | ICD-10-CM | POA: Insufficient documentation

## 2013-03-12 DIAGNOSIS — M25511 Pain in right shoulder: Secondary | ICD-10-CM

## 2013-03-12 DIAGNOSIS — E7800 Pure hypercholesterolemia, unspecified: Secondary | ICD-10-CM | POA: Insufficient documentation

## 2013-03-12 DIAGNOSIS — F411 Generalized anxiety disorder: Secondary | ICD-10-CM | POA: Insufficient documentation

## 2013-03-12 DIAGNOSIS — Z79899 Other long term (current) drug therapy: Secondary | ICD-10-CM | POA: Insufficient documentation

## 2013-03-12 DIAGNOSIS — F329 Major depressive disorder, single episode, unspecified: Secondary | ICD-10-CM | POA: Insufficient documentation

## 2013-03-12 DIAGNOSIS — M25519 Pain in unspecified shoulder: Secondary | ICD-10-CM | POA: Insufficient documentation

## 2013-03-12 DIAGNOSIS — G8911 Acute pain due to trauma: Secondary | ICD-10-CM | POA: Insufficient documentation

## 2013-03-12 MED ORDER — HYDROCODONE-ACETAMINOPHEN 5-325 MG PO TABS
2.0000 | ORAL_TABLET | Freq: Once | ORAL | Status: AC
  Administered 2013-03-12: 2 via ORAL
  Filled 2013-03-12: qty 2

## 2013-03-12 MED ORDER — HYDROCODONE-ACETAMINOPHEN 5-325 MG PO TABS: 2.0000 | ORAL_TABLET | ORAL | Status: DC | PRN

## 2013-03-12 MED ORDER — TRAMADOL HCL 50 MG PO TABS
100.0000 mg | ORAL_TABLET | Freq: Once | ORAL | Status: DC
  Filled 2013-03-12: qty 2

## 2013-03-12 MED ORDER — DIAZEPAM 5 MG PO TABS
5.0000 mg | ORAL_TABLET | Freq: Once | ORAL | Status: AC
  Administered 2013-03-12: 5 mg via ORAL
  Filled 2013-03-12: qty 1

## 2013-03-12 MED ORDER — POVIDONE-IODINE 10 % EX SOLN
CUTANEOUS | Status: AC
  Filled 2013-03-12: qty 118

## 2013-03-12 NOTE — ED Notes (Addendum)
Pain rt shoulder, after fall , seen here for same., says she fell thru floor in her home. 2/28, Dx with contusion,  Says she cont to hurt, and pain in legs and back.  Pt has been taking her bp meds as ordered.  The sling pt was wearing was too small , replaced with lg size,  Good radial pulse

## 2013-03-12 NOTE — Discharge Instructions (Signed)
Contusion A contusion is a deep bruise. Contusions are the result of an injury that caused bleeding under the skin. The contusion may turn blue, purple, or yellow. Minor injuries will give you a painless contusion, but more severe contusions may stay painful and swollen for a few weeks.  CAUSES  A contusion is usually caused by a blow, trauma, or direct force to an area of the body. SYMPTOMS   Swelling and redness of the injured area.  Bruising of the injured area.  Tenderness and soreness of the injured area.  Pain. DIAGNOSIS  The diagnosis can be made by taking a history and physical exam. An X-ray, CT scan, or MRI may be needed to determine if there were any associated injuries, such as fractures. TREATMENT  Specific treatment will depend on what area of the body was injured. In general, the best treatment for a contusion is resting, icing, elevating, and applying cold compresses to the injured area. Over-the-counter medicines may also be recommended for pain control. Ask your caregiver what the best treatment is for your contusion. HOME CARE INSTRUCTIONS   Put ice on the injured area.  Put ice in a plastic bag.  Place a towel between your skin and the bag.  Leave the ice on for 15-20 minutes, 03-04 times a day.  Only take over-the-counter or prescription medicines for pain, discomfort, or fever as directed by your caregiver. Your caregiver may recommend avoiding anti-inflammatory medicines (aspirin, ibuprofen, and naproxen) for 48 hours because these medicines may increase bruising.  Rest the injured area.  If possible, elevate the injured area to reduce swelling. SEEK IMMEDIATE MEDICAL CARE IF:   You have increased bruising or swelling.  You have pain that is getting worse.  Your swelling or pain is not relieved with medicines. MAKE SURE YOU:   Understand these instructions.  Will watch your condition.  Will get help right away if you are not doing well or get  worse. Document Released: 09/30/2004 Document Revised: 03/15/2011 Document Reviewed: 10/26/2010 [facility] Patient Information 2014 [place], [place].    Take medications as prescribed Ortho follow-up with Dr. Romeo AppleHarrison Try heating pad

## 2013-03-12 NOTE — ED Notes (Addendum)
Pt states continued pain to right shoulder since last visit. Dx with contusion. Waiting to get in to see Dr. Romeo AppleHarrison. Current BP is 154/116

## 2013-03-12 NOTE — ED Provider Notes (Signed)
CSN: [id number]     Arrival date & time 03/12/13  1258 History   First MD Initiated Contact with Patient 03/12/13 1345     Chief Complaint  Patient presents with  . Shoulder Pain     (Consider location/radiation/quality/duration/timing/severity/associated sxs/prior Treatment) Patient is a 43 y.o. female presenting with shoulder pain. The history is provided by the patient. No language interpreter was used.  Shoulder Pain Associated symptoms include arthralgias. Pertinent negatives include no joint swelling, numbness or weakness.   Pt is a 43 year old female who presents with shoulder pain after a fall that occurred on 2/28. She reports that she is waiting for an appt to see Dr. Romeo Apple. She denies any numbness or tingling in her fingers. She presents today ambulatory without any deficits with her right arm in a sling.   Past Medical History  Diagnosis Date  . Hypertension   . High cholesterol   . Anxiety   . Depression    Past Surgical History  Procedure Laterality Date  . Cholecystectomy    . Elbow surgery  left   Family History  Problem Relation Age of Onset  . Hypertension Brother    History  Substance Use Topics  . Smoking status: Current Every Day Smoker -- 0.50 packs/day for 35 years    Types: Cigarettes  . Smokeless tobacco: Not on file  . Alcohol Use: No   OB History   Grav Para Term Preterm Abortions TAB SAB Ect Mult Living   3 1   2           Review of Systems  Musculoskeletal: Positive for arthralgias. Negative for gait problem and joint swelling.  Neurological: Negative for weakness and numbness.      Allergies  Codeine and Tramadol  Home Medications   Current Outpatient Rx  Name  Route  Sig  Dispense  Refill  . ALPRAZolam (XANAX) 0.5 MG tablet   Oral   Take 0.5 mg by mouth 3 (three) times daily as needed for anxiety.         . baclofen (LIORESAL) 10 MG tablet   Oral   Take 1 tablet (10 mg total) by mouth 3 (three) times daily.   21 each   0   . Ca Carbonate-Mag Hydroxide (ROLAIDS PO)   Oral   Take 2 tablets by mouth daily as needed (heartburn).         . diclofenac (VOLTAREN) 75 MG EC tablet   Oral   Take 1 tablet (75 mg total) by mouth 2 (two) times daily.   14 tablet   0   . lisinopril-hydrochlorothiazide (PRINZIDE,ZESTORETIC) 20-25 MG per tablet   Oral   Take 1 tablet by mouth daily.   30 tablet   3   . simvastatin (ZOCOR) 20 MG tablet   Oral   Take 1 tablet (20 mg total) by mouth at bedtime.   90 tablet   3   . verapamil (CALAN) 80 MG tablet   Oral   Take 1 tablet (80 mg total) by mouth 2 (two) times daily.   60 tablet   3   . HYDROcodone-acetaminophen (NORCO/VICODIN) 5-325 MG per tablet   Oral   Take 2 tablets by mouth every 4 (four) hours as needed.   20 tablet   0    BP 153/107  Pulse 90  Temp(Src) 97.6 F (36.4 C) (Oral)  Resp 20  Ht 5\' 2"  (1.575 m)  SpO2 97%  LMP 03/03/2013 Physical Exam  Nursing  note and vitals reviewed. Constitutional: She is oriented to person, place, and time. She appears well-developed and well-nourished. No distress.  HENT:  Head: Normocephalic and atraumatic.  Eyes: Conjunctivae and EOM are normal.  Neck: Normal range of motion. Neck supple. No JVD present. No tracheal deviation present. No thyromegaly present.  Cardiovascular: Normal rate, regular rhythm, normal heart sounds and intact distal pulses.   Pulmonary/Chest: Effort normal and breath sounds normal. No respiratory distress. She has no wheezes.  Musculoskeletal:  Right shoulder in a sling. Not cooperative with exam due to pain. Limited ROM of right shoulder. 2+distal pulses. No numbness or tingling.   Neurological: She is alert and oriented to person, place, and time.  Skin: Skin is warm and dry. No rash noted.  Psychiatric: She has a normal mood and affect. Her behavior is normal. Judgment and thought content normal.    ED Course  Procedures (including critical care time) Labs Review Labs  Reviewed - No data to display Imaging Review No results found.   EKG Interpretation None      MDM   Final diagnoses:  Shoulder pain, right    Pt seen previously with shoulder contusion and returns here today with c/o pain. No chest pain or shortness of breath. Pt sized for appropriate sling and applied. Relief with hydrocodone and valium. Discussed plan of care for sling and follow-up. Prescription for hydrocodone given.      Irish EldersKelly Cinzia Devos, NP 03/14/13 0107

## 2013-03-12 NOTE — ED Notes (Signed)
Pt says tramadol causes nausea, not given

## 2013-03-13 ENCOUNTER — Telehealth: Payer: Self-pay | Admitting: Orthopedic Surgery
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
## 2013-03-15 NOTE — ED Provider Notes (Signed)
Medical screening examination/treatment/procedure(s) were performed by non-physician practitioner and as supervising physician I was immediately available for consultation/collaboration.   EKG Interpretation None        Chizara Mena L Dagen Beevers, MD 03/15/13 1533 

## 2013-03-19 ENCOUNTER — Emergency Department (HOSPITAL_COMMUNITY): Payer: Self-pay

## 2013-03-19 ENCOUNTER — Emergency Department (HOSPITAL_COMMUNITY)
Admission: EM | Admit: 2013-03-19 | Discharge: 2013-03-19 | Disposition: A | Discharge status: Home / Self Care | Payer: Self-pay | Attending: Emergency Medicine | Admitting: Emergency Medicine

## 2013-03-19 ENCOUNTER — Encounter (HOSPITAL_COMMUNITY): Payer: Self-pay | Admitting: *Deleted

## 2013-03-19 DIAGNOSIS — F17200 Nicotine dependence, unspecified, uncomplicated: Secondary | ICD-10-CM | POA: Insufficient documentation

## 2013-03-19 DIAGNOSIS — Z79899 Other long term (current) drug therapy: Secondary | ICD-10-CM | POA: Insufficient documentation

## 2013-03-19 DIAGNOSIS — E7800 Pure hypercholesterolemia, unspecified: Secondary | ICD-10-CM | POA: Insufficient documentation

## 2013-03-19 DIAGNOSIS — Z791 Long term (current) use of non-steroidal anti-inflammatories (NSAID): Secondary | ICD-10-CM | POA: Insufficient documentation

## 2013-03-19 DIAGNOSIS — I10 Essential (primary) hypertension: Secondary | ICD-10-CM | POA: Insufficient documentation

## 2013-03-19 DIAGNOSIS — M25519 Pain in unspecified shoulder: Secondary | ICD-10-CM | POA: Insufficient documentation

## 2013-03-19 DIAGNOSIS — F411 Generalized anxiety disorder: Secondary | ICD-10-CM | POA: Insufficient documentation

## 2013-03-19 DIAGNOSIS — G8911 Acute pain due to trauma: Secondary | ICD-10-CM | POA: Insufficient documentation

## 2013-03-19 DIAGNOSIS — M25511 Pain in right shoulder: Secondary | ICD-10-CM

## 2013-03-19 DIAGNOSIS — F329 Major depressive disorder, single episode, unspecified: Secondary | ICD-10-CM | POA: Insufficient documentation

## 2013-03-19 DIAGNOSIS — F3289 Other specified depressive episodes: Secondary | ICD-10-CM | POA: Insufficient documentation

## 2013-03-19 MED ORDER — PROMETHAZINE HCL 25 MG PO TABS: 25.0000 mg | ORAL_TABLET | Freq: Four times a day (QID) | ORAL | Status: DC | PRN

## 2013-03-19 MED ORDER — HYDROCODONE-ACETAMINOPHEN 5-325 MG PO TABS: 1.0000 | ORAL_TABLET | Freq: Four times a day (QID) | ORAL | Status: DC | PRN

## 2013-03-19 MED ORDER — PROMETHAZINE HCL 25 MG PO TABS: 25.0000 mg | ORAL_TABLET | Freq: Three times a day (TID) | ORAL | Status: DC | PRN

## 2013-03-19 MED ORDER — OXYCODONE-ACETAMINOPHEN 5-325 MG PO TABS
1.0000 | ORAL_TABLET | Freq: Once | ORAL | Status: AC
  Administered 2013-03-19: 1 via ORAL
  Filled 2013-03-19: qty 1

## 2013-03-19 NOTE — ED Provider Notes (Signed)
CSN: [id number]     Arrival date & time 03/19/13  1533 History  This chart was scribed for Beth Mornavid Carah Barrientes, NP working with Junius ArgyleForrest S Harrison, MD by Quintella ReichertMatthew Underwood, ED Scribe. This patient was seen in room TR09C/TR09C and the patient's care was started at 4:35 PM.   Chief Complaint  Patient presents with  . Shoulder Injury    Patient is a 43 y.o. female presenting with arm injury. The history is provided by the patient. No language interpreter was used.  Arm Injury Location:  Shoulder Time since incident:  9 days Injury: yes   Mechanism of injury: fall   Shoulder location:  R shoulder Pain details:    Pain severity now: Moderate-to-severe.   Onset quality:  Sudden   Duration:  9 days Chronicity:  New Handedness:  Right-handed Worsened by:  Movement Ineffective treatments:  Ice and heat   HPI Comments: Beth Sanford is a 43 y.o. female who presents to the Emergency Department complaining of a right shoulder injury sustained 9 days ago.  Pt reports that she fell through a bathroom floor and landed on her right shoulder.  She was seen at [facility] on 3/9 and diagnosed with a contusion.  She was placed on a sling and given a prescription of hydrocodone and advised to f/u with orthopedics.  She states she has continued to have moderate-to-severe pain to that shoulder radiating into her right upper back.  Pain is worsened by moving her arm and she states she cannot raise her arm to brush her hair.  She also states she can feel her shoulder "popping in and out."   She denies repeat injuries.  She is right-handed.   Past Medical History  Diagnosis Date  . Hypertension   . High cholesterol   . Anxiety   . Depression     Past Surgical History  Procedure Laterality Date  . Cholecystectomy    . Elbow surgery  left    Family History  Problem Relation Age of Onset  . Hypertension Brother     History  Substance Use Topics  . Smoking status: Current Every Day Smoker -- 0.50  packs/day for 35 years    Types: Cigarettes  . Smokeless tobacco: Not on file  . Alcohol Use: No    OB History   Grav Para Term Preterm Abortions TAB SAB Ect Mult Living   3 1   2             Review of Systems  Musculoskeletal: Positive for arthralgias (right shoulder).  All other systems reviewed and are negative.      Allergies  Codeine and Tramadol  Home Medications   Current Outpatient Rx  Name  Route  Sig  Dispense  Refill  . ALPRAZolam (XANAX) 0.5 MG tablet   Oral   Take 0.5 mg by mouth 3 (three) times daily as needed for anxiety.         . baclofen (LIORESAL) 10 MG tablet   Oral   Take 1 tablet (10 mg total) by mouth 3 (three) times daily.   21 each   0   . Ca Carbonate-Mag Hydroxide (ROLAIDS PO)   Oral   Take 2 tablets by mouth daily as needed (heartburn).         . diclofenac (VOLTAREN) 75 MG EC tablet   Oral   Take 1 tablet (75 mg total) by mouth 2 (two) times daily.   14 tablet   0   .  HYDROcodone-acetaminophen (NORCO/VICODIN) 5-325 MG per tablet   Oral   Take 2 tablets by mouth every 4 (four) hours as needed.   20 tablet   0   . lisinopril-hydrochlorothiazide (PRINZIDE,ZESTORETIC) 20-25 MG per tablet   Oral   Take 1 tablet by mouth daily.   30 tablet   3   . simvastatin (ZOCOR) 20 MG tablet   Oral   Take 1 tablet (20 mg total) by mouth at bedtime.   90 tablet   3   . verapamil (CALAN) 80 MG tablet   Oral   Take 1 tablet (80 mg total) by mouth 2 (two) times daily.   60 tablet   3    BP 162/107  Pulse 94  Temp(Src) 98.2 F (36.8 C) (Oral)  Resp 20  SpO2 100%  LMP 03/03/2013   Physical Exam  Nursing note and vitals reviewed. Constitutional: She is oriented to person, place, and time. She appears well-developed and well-nourished. No distress.  HENT:  Head: Normocephalic and atraumatic.  Eyes: EOM are normal.  Neck: Neck supple. No tracheal deviation present.  Cardiovascular: Normal rate.   Pulmonary/Chest: Effort  normal. No respiratory distress.  Musculoskeletal: Normal range of motion.  Right shoudler: Limited ROM due to pain.  No step-offs, no obvious deformity.  Neurological: She is alert and oriented to person, place, and time.  Skin: Skin is warm and dry.  Psychiatric: She has a normal mood and affect. Her behavior is normal.    ED Course  Procedures (including critical care time)  DIAGNOSTIC STUDIES: Oxygen Saturation is 100% on room air, normal by my interpretation.    COORDINATION OF CARE: 4:43 PM-Reviewed prior films which show no joint injury to shoulder.  Pt denies any repeat injuries.  4:44 PM-Discussed treatment plan which includes x-ray of ribs with pt at bedside and pt agreed to plan.    Labs Review Labs Reviewed - No data to display   Imaging Review Dg Ribs Unilateral W/chest Right  03/19/2013   CLINICAL DATA:  Shoulder injury.  EXAM: RIGHT RIBS AND CHEST - 3+ VIEW  COMPARISON:  10/19/2012  FINDINGS: No fracture or other bone lesions are seen involving the ribs. There is no evidence of pneumothorax or pleural effusion. Both lungs are clear. Heart size and mediastinal contours are within normal limits.  IMPRESSION: Negative.   Electronically Signed   By: Signa Kell M.D.   On: 03/19/2013 18:20   Dg Scapula Right  03/19/2013   CLINICAL DATA:  Larey Seat landing a right upper back 2 weeks ago, persistent pain  EXAM: RIGHT SCAPULA - 2+ VIEWS  COMPARISON:  None.  FINDINGS: Osseous mineralization grossly normal.  AC joint alignment normal.  Mild glenohumeral joint space narrowing.  Question tiny cervical rib.  No acute fracture, dislocation, or bone destruction.  Visualized right ribs grossly intact.  IMPRESSION: No acute abnormalities.   Electronically Signed   By: Ulyses Southward M.D.   On: 03/19/2013 18:18     EKG Interpretation None     Reviewed radiology findings and shared with patient. Patient reports prior injury to shoulder.  Has been wearing sling since initial evaluation 2  weeks ago.  Suspect adhesive capsulitis with limited ROM d/t increased pain with active or passive ROM.  Distal PMS intact.    MDM   Final diagnoses:  Shoulder pain, right        I personally performed the services described in this documentation, which was scribed in my presence. The recorded information  has been reviewed and is accurate.    Jimmye Norman, NP 03/19/13 [phone]

## 2013-03-19 NOTE — Discharge Instructions (Signed)
Adhesive Capsulitis Sometimes the shoulder becomes stiff and is painful to move. Some people say it feels as if the shoulder is frozen in place. Because of this, the condition is called "frozen shoulder." Its medical name is adhesive capsulitis.  The shoulder joint is made up of strong connective tissue that attaches the ball of the humerus to the shallow shoulder socket. This strong connective tissue is called the joint capsule. This tissue can become stiff and swollen. That is when adhesive capsulitis sets in. CAUSES  It is not always clear just what the cause adhesive capsulitis. Possibilities include:  Injury to the shoulder joint.  Strain. This is a repetitive injury brought about by overuse.  Lack of use. Perhaps your arm or hand was otherwise injured. It might have been in a sling for awhile. Or perhaps you were not using it to avoid pain.  Referred pain. This is a sort of trick the body plays. You feel pain in the shoulder. But, the pain actually comes from an injury somewhere else in the body.  Long-standing health problems. Several diseases can cause adhesive capsulitis. They include diabetes, heart disease, stroke, thyroid problems, rheumatoid arthritis and lung disease.  Being a women older than 40. Anyone can develop adhesive capsulitis but it is most common in women in this age group. SYMPTOMS   Pain.  It occurs when the arm is moved.  Parts of the shoulder might hurt if they are touched.  Pain is worse at night or when resting.  Soreness. It might not be strong enough to be called pain. But, the shoulder aches.  The shoulder does not move freely.  Muscle spasms.  Trouble sleeping because of shoulder ache or pain. DIAGNOSIS  To decide if you have adhesive capsulitis, your healthcare provider will probably:  Ask about symptoms you have noticed.  Ask about your history of joint pain and anything that might have caused the pain.  Ask about your overall  health.  Use hands to feel your shoulder and neck.  Ask you to move your shoulder in specific directions. This may indicate the origin of the pain.  Order imaging tests; pictures of the shoulder. They help pinpoint the source of the problem. An X-ray might be used. For more detail, an MRI is often used. An MRI details the tendons, muscles and ligaments as well as the joint. TREATMENT  Adhesive capsulitis can be treated several ways. Most treatments can be done in a clinic or in your healthcare provider's office. Be sure to discuss the different options with your caregiver. They include:  Physical therapy. You will work on specific exercises to get your shoulder moving again. The exercises usually involve stretching. A physical therapist (a caregiver with special training) can show you what to do and what not to do. The exercises will need to be done daily.  Medication.  Over-the-counter medicines may relieve pain and inflammation (the body's way of reacting to injury or infection).  Corticosteroids. These are stronger drugs to reduce pain and inflammation. They are given by injection (shots) into the shoulder joint. Frequent treatment is not recommended.  Muscle relaxants. Medication may be prescribed to ease muscle spasms.  Treatment of underlying conditions. This means treating another condition that is causing your shoulder problem. This might be a rotator cuff (tendon) problem  Shoulder manipulation. The shoulder will be moved by your healthcare provider. You would be under general anesthesia (given a drug that puts you to sleep). You would not feel anything. Sometimes   the joint will be injected with salt water (saline) at high pressure to break down internal scarring in the joint capsule.  Surgery. This is rarely needed. It may be suggested in advanced cases after all other treatment has failed. PROGNOSIS  In time, most people recover from adhesive capsulitis. Sometimes, however, the  pain goes away but full movement of the shoulder does not return.  HOME CARE INSTRUCTIONS   Take any pain medications recommended by your healthcare provider. Follow the directions carefully.  If you have physical therapy, follow through with the therapist's suggestions. Be sure you understand the exercises you will be doing. You should understand:  How often the exercises should be done.  How many times each exercise should be repeated.  How long they should be done.  What other activities you should do, or not do.  That you should warm up before doing any exercise. Just 5 to 10 minutes will help. Small, gentle movements should get your shoulder ready for more.  Avoid high-demand exercise that involves your shoulder such as throwing. This type of exercise can make pain worse.  Consider using cold packs. Cold may ease swelling and pain. Ask your healthcare provider if a cold pack might help you. If so, get directions on how and when to use them. SEEK MEDICAL CARE IF:   You have any questions about your medications.  Your pain continues to increase. Document Released: 10/18/2008 Document Revised: 03/15/2011 Document Reviewed: 10/18/2008 [employer] Patient Information 2014 [employer].  

## 2013-03-19 NOTE — ED Notes (Signed)
Pt reports 2 weeks ago fell through the bathroom floor and landed on right shoulder. Pt was seen at [facility] - [facility] Sanford dx with shoulder contusion. Is wearing sling, reports pain is still present and requesting a new xray. Feels like the ball is sliding in and out of joint. Sensation intact, can raise arm. States can't raise arm to brush hair. Pt is a x 4

## 2013-03-19 NOTE — ED Notes (Signed)
ALL THE PRIOR CHARTING WAS DONE BY Margie EgeSARAH Rejoice Heatwole, RN.

## 2013-03-20 NOTE — ED Provider Notes (Signed)
Medical screening examination/treatment/procedure(s) were performed by non-physician practitioner and as supervising physician I was immediately available for consultation/collaboration.   EKG Interpretation None        Junius ArgyleForrest S Jerusha Reising, MD 03/20/13 1435

## 2013-03-21 ENCOUNTER — Telehealth: Payer: Self-pay | Admitting: Cardiovascular Disease

## 2013-03-21 NOTE — Telephone Encounter (Signed)
Unable to reach pt or leave a message  

## 2013-03-21 NOTE — Telephone Encounter (Signed)
Patient has no PCP.  Patient states that her BP medicine is not working and is wanting Dr.Koneswaran to [place] her something for anxiety. / tgs

## 2013-03-22 MED ORDER — LISINOPRIL 20 MG PO TABS: 20.0000 mg | ORAL_TABLET | Freq: Every day | ORAL | Status: DC

## 2013-03-22 MED ORDER — VERAPAMIL HCL 80 MG PO TABS
80.0000 mg | ORAL_TABLET | Freq: Two times a day (BID) | ORAL | Status: DC
Start: 2013-03-22 — End: 2013-11-06

## 2013-03-22 NOTE — Telephone Encounter (Signed)
Take an extra 20 mg lisinopril each evening.

## 2013-03-22 NOTE — Telephone Encounter (Signed)
Made pt aware. Pt will pick up medication.

## 2013-03-22 NOTE — Telephone Encounter (Signed)
Pt states that her BP has been high for a couple of weeks now. Pt does not have a BP cuff at home. Bp at hosptial on 3-16 was 162/107. Pt states "I just don't feel right". Pt stays tired, does not want to get out of bed. Pt states that it might be due to not having her anxiety medication. I told pt to go to PCP for this medication, she states that no office is accepting new pt's at this time. Pt states she has been trying for two months now. I told her to try [facility] and [facility] de[facility], she states she tried both. Please advise.

## 2013-03-26 ENCOUNTER — Encounter: Payer: Self-pay | Admitting: *Deleted

## 2013-03-26 ENCOUNTER — Telehealth: Payer: Self-pay | Admitting: *Deleted

## 2013-03-26 DIAGNOSIS — I10 Essential (primary) hypertension: Secondary | ICD-10-CM

## 2013-03-26 NOTE — Telephone Encounter (Signed)
Sent labs in mail for pt to get done.

## 2013-03-28 NOTE — Telephone Encounter (Signed)
No further response from patient. °

## 2013-03-30 ENCOUNTER — Telehealth: Payer: Self-pay | Admitting: Cardiovascular Disease

## 2013-03-30 NOTE — Telephone Encounter (Signed)
Advised pt to take Lisinopril/HCTZ in am with Lisinopril 20 mg at night.

## 2013-04-07 LAB — LIPID PANEL
CHOLESTEROL: 173 mg/dL (ref 0–200)
HDL: 44 mg/dL (ref >39)
LDL Cholesterol: 90 mg/dL (ref 0–99)
Total CHOL/HDL Ratio: 3.9 Ratio
Triglycerides: 194 mg/dL — ABNORMAL HIGH (ref <150)
VLDL: 39 mg/dL (ref 0–40)

## 2013-04-11 ENCOUNTER — Encounter: Payer: Self-pay | Admitting: *Deleted

## 2013-06-08 ENCOUNTER — Encounter (HOSPITAL_COMMUNITY): Payer: Self-pay | Admitting: Psychiatry

## 2013-06-08 ENCOUNTER — Ambulatory Visit (INDEPENDENT_AMBULATORY_CARE_PROVIDER_SITE_OTHER): Payer: Self-pay | Admitting: Psychiatry

## 2013-06-08 ENCOUNTER — Other Ambulatory Visit (HOSPITAL_COMMUNITY): Payer: Self-pay | Admitting: Psychiatry

## 2013-06-08 VITALS — BP 130/70 | Ht 62.0 in | Wt 170.0 lb

## 2013-06-08 DIAGNOSIS — F329 Major depressive disorder, single episode, unspecified: Secondary | ICD-10-CM

## 2013-06-08 DIAGNOSIS — F411 Generalized anxiety disorder: Secondary | ICD-10-CM

## 2013-06-08 DIAGNOSIS — F332 Major depressive disorder, recurrent severe without psychotic features: Secondary | ICD-10-CM

## 2013-06-08 MED ORDER — ALPRAZOLAM 1 MG PO TABS: 1.0000 mg | ORAL_TABLET | Freq: Four times a day (QID) | ORAL | Status: DC

## 2013-06-08 MED ORDER — CITALOPRAM HYDROBROMIDE 20 MG PO TABS: 20.0000 mg | ORAL_TABLET | Freq: Every day | ORAL | Status: DC

## 2013-06-08 MED ORDER — TRAZODONE HCL 100 MG PO TABS: 100.0000 mg | ORAL_TABLET | Freq: Every day | ORAL | Status: DC
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
## 2013-07-02 ENCOUNTER — Ambulatory Visit (INDEPENDENT_AMBULATORY_CARE_PROVIDER_SITE_OTHER): Payer: Self-pay | Admitting: Psychology

## 2013-07-02 DIAGNOSIS — F331 Major depressive disorder, recurrent, moderate: Secondary | ICD-10-CM

## 2013-07-05 ENCOUNTER — Encounter (HOSPITAL_COMMUNITY): Payer: Self-pay | Admitting: Psychiatry

## 2013-07-05 ENCOUNTER — Ambulatory Visit (INDEPENDENT_AMBULATORY_CARE_PROVIDER_SITE_OTHER): Payer: Self-pay | Admitting: Psychiatry

## 2013-07-05 VITALS — BP 120/110 | Ht 62.0 in | Wt 165.0 lb

## 2013-07-05 DIAGNOSIS — F332 Major depressive disorder, recurrent severe without psychotic features: Secondary | ICD-10-CM

## 2013-07-05 DIAGNOSIS — F331 Major depressive disorder, recurrent, moderate: Secondary | ICD-10-CM

## 2013-07-05 DIAGNOSIS — F411 Generalized anxiety disorder: Secondary | ICD-10-CM

## 2013-07-05 MED ORDER — CITALOPRAM HYDROBROMIDE 20 MG PO TABS: 20.0000 mg | ORAL_TABLET | Freq: Every day | ORAL | Status: DC

## 2013-07-05 MED ORDER — TRAZODONE HCL 100 MG PO TABS: 100.0000 mg | ORAL_TABLET | Freq: Every day | ORAL | Status: DC

## 2013-07-05 MED ORDER — ALPRAZOLAM 1 MG PO TABS: 1.0000 mg | ORAL_TABLET | Freq: Four times a day (QID) | ORAL | Status: DC
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
## 2013-07-26 ENCOUNTER — Other Ambulatory Visit: Payer: Self-pay | Admitting: Cardiovascular Disease

## 2013-07-31 ENCOUNTER — Ambulatory Visit (INDEPENDENT_AMBULATORY_CARE_PROVIDER_SITE_OTHER): Payer: Self-pay | Admitting: Psychology

## 2013-07-31 DIAGNOSIS — F411 Generalized anxiety disorder: Secondary | ICD-10-CM

## 2013-07-31 DIAGNOSIS — F331 Major depressive disorder, recurrent, moderate: Secondary | ICD-10-CM

## 2013-08-01 ENCOUNTER — Encounter (HOSPITAL_COMMUNITY): Payer: Self-pay | Admitting: Psychology

## 2013-08-01 NOTE — Progress Notes (Signed)
PROGRESS NOTE  Patient:  Beth Sanford   DOB: 1970/02/16  MR Number: [id number]  Location: [facility] [address] [place] [address] Dept: [phone]  Start: 4 PM End: 5 PM  Provider/Observer:     Hershal Coria PSYD  Chief Complaint:      Chief Complaint  Patient presents with  . Anxiety  . Depression    Reason For Service:     The patient was referred by Dr. Tenny Craw for psychotherapeutic interventions. The patient reports that she has had significant troubles with depression for many years. She reports that she had been seen Dr. Tiburcio Pea for psychiatric services as well as a counselor and like them both by Dr. Tiburcio Pea moved her office to [facility]. She then tried a new psychiatrist and did not particularly related to them very well and stopped seeing him. She is now return in and started seeing Dr. Tenny Craw. The patient reports that she's been without her medications for depression and anxiety for some time. The patient reports that she has started back on her psychotropic medications and has been feeling better. However, she reports that she continues to be "stressed out" and has recurrent panic attacks. The patient also significant medical issues including congestive heart disease and COPD. The patient reports that while she has been feeling better she has continued to have a lot of anxiety and depression. Reports that she is in the process of quitting smoking cigarettes and has only been using E. cigarette/vapor and is also trying to stop that as well. The patient reports that there continues to be a lot of stress between her and her long-time boyfriend and she reports that he has not been time for related to work very well at all.  Interventions Strategy:  Cognitive/behavioral psychotherapeutic interventions  Participation Level:   Active  Participation Quality:  Appropriate      Behavioral  Observation:  Well Groomed, Alert, and Appropriate.   Current Psychosocial Factors: The patient reports that she and her boyfriend had increasing difficulties recently and she is asked him to move out. She reports a distress does not feel like he has any interest in her at all. She reports that he will not talk to her and shows no attention to her and he will get up early in the morning and be gone all day. The patient reports that she is feeling very lonely with him not bear but realizes that when he is there it actually makes her feel even worse.  Content of Session:   Review current symptoms and work on therapeutic interventions for issues related to recurrent depression as well as anxiety.  Current Status:   The patient reports that she has been doing better recently with the restart of her psychotropic medications. However, she reports that she does continue to have significant symptoms of depression and anxiety.  Patient Progress:   Stable with improvement initially from psychotropic interventions.  Target Goals:   Target goals include reducing the intensity, severity, and duration of symptoms of depression including feelings of hopelessness and helplessness, social isolation, and withdrawal from others. The patient reports that she would also like to work specifically on reducing the frequency and intensity of her anxiety and panic-like symptoms  Last Reviewed:   07/31/2013  Goals Addressed Today:    Today we worked on Producer, television/film/video and strategies utilizing the cognitive approach around her interpretation of her situation in her life.  Impression/Diagnosis:   The patient has a long history of recurrent symptoms of depression without psychotic features. She also describes significant symptoms of anxiety and possible panic attack.  Diagnosis:    Axis I: Major depressive disorder, recurrent episode, moderate  Generalized anxiety disorder   Sharrod Achille R, PsyD

## 2013-08-01 NOTE — Progress Notes (Signed)
   PROGRESS NOTE  Patient:  Beth MinerSheree C Cantrelle   DOB: 03/19/1970  MR Number: [id number]  [facility] [address] [place] [id number] Dept: [phone]  Start: 3 PM End: 4 PM  Provider/Observer:     Hershal CoriaJohn R Luka Stohr PSYD  Chief Complaint:      Chief Complaint  Patient presents with  . Anxiety  . Depression    Reason For Service:     The patient was referred by Dr. Tenny Crawoss for psychotherapeutic interventions. The patient reports that she has had significant troubles with depression for many years. She reports that she had been seen Dr. Tiburcio PeaHarris for psychiatric services as well as a counselor and like them both by Dr[facility] She then tried a new psychiatrist and did not particularly related to them very well and stopped seeing him. She is now return in and started seeing Dr. Tenny Crawoss. The patient reports that she's been without her medications for depression and anxiety for some time. The patient reports that she has started back on her psychotropic medications and has been feeling better. However, she reports that she continues to be "stressed out" and has recurrent panic attacks. The patient also significant medical issues including congestive heart disease and COPD. The patient reports that while she has been feeling better she has continued to have a lot of anxiety and depression. Reports that she is in the process of quitting smoking cigarettes and has only been using E. cigarette/vapor and is also trying to stop that as well. The patient reports that there continues to be a lot of stress between her and her long-time boyfriend and she reports that he has not been time for related to work very well at all.  Interventions Strategy:  Cognitive/behavioral psychotherapeutic interventions  Participation Level:   Active  Participation Quality:  Appropriate      Behavioral  Observation:  Well Groomed, Alert, and Appropriate.   Current Psychosocial Factors: The patient reports that she has continued to experience a lot of panic attacks and feels like her boyfriend has not been all that helpful for her. The patient reports that she feels alone and isolated much of the time.  Content of Session:   Review current symptoms and work on therapeutic interventions for issues related to recurrent depression as well as anxiety.  Current Status:   The patient reports that she has been doing better recently with the restart of her psychotropic medications. However, she reports that she does continue to have significant symptoms of depression and anxiety.  Patient Progress:   Stable with improvement initially from psychotropic interventions.  Target Goals:   Target goals include reducing the intensity, severity, and duration of symptoms of depression including feelings of hopelessness and helplessness, social isolation, and withdrawal from others. The patient reports that she would also like to work specifically on reducing the frequency and intensity of her anxiety and panic-like symptoms  Last Reviewed:   07/03/2013  Goals Addressed Today:    Today we worked on Producer, television/film/videobuilding coping skills and strategies utilizing the cognitive approach around her interpretation of her situation in her life.  Impression/Diagnosis:   The patient has a long history of recurrent symptoms of depression without psychotic features. She also describes significant symptoms of anxiety and possible panic attack.  Diagnosis:    Axis I: Major depressive disorder, recurrent episode, moderate   Mickey Esguerra R, PsyD

## 2013-08-24 ENCOUNTER — Ambulatory Visit (INDEPENDENT_AMBULATORY_CARE_PROVIDER_SITE_OTHER): Payer: Self-pay | Admitting: Psychology

## 2013-08-24 DIAGNOSIS — F331 Major depressive disorder, recurrent, moderate: Secondary | ICD-10-CM

## 2013-08-24 DIAGNOSIS — F411 Generalized anxiety disorder: Secondary | ICD-10-CM

## 2013-08-24 NOTE — Progress Notes (Signed)
PROGRESS NOTE  Patient:  Beth Sanford   DOB: Dec 08, 1970  MR Number: [id number]  Location: BEHAVIORAL [facility] [address] [place] [address] Dept: [phone]  Start: 1 PM End: 2 PM  Provider/Observer:     Hershal Coria PSYD  Chief Complaint:      Chief Complaint  Patient presents with  . Anxiety  . Depression  . Stress    Reason For Service:     The patient was referred by Dr. Tenny Craw for psychotherapeutic interventions. The patient reports that she has had significant troubles with depression for many years. She reports that she had been seen Dr. Tiburcio Pea for psychiatric services as well as a counselor and like them both by Dr. Tiburcio Pea moved her office to [facility]. She then tried a new psychiatrist and did not particularly related to them very well and stopped seeing him. She is now return in and started seeing Dr. Tenny Craw. The patient reports that she's been without her medications for depression and anxiety for some time. The patient reports that she has started back on her psychotropic medications and has been feeling better. However, she reports that she continues to be "stressed out" and has recurrent panic attacks. The patient also significant medical issues including congestive heart disease and COPD. The patient reports that while she has been feeling better she has continued to have a lot of anxiety and depression. Reports that she is in the process of quitting smoking cigarettes and has only been using E. cigarette/vapor and is also trying to stop that as well. The patient reports that there continues to be a lot of stress between her and her long-time boyfriend and she reports that he has not been time for related to work very well at all.  Interventions Strategy:  Cognitive/behavioral psychotherapeutic interventions  Participation Level:   Active  Participation Quality:  Appropriate       Behavioral Observation:  Well Groomed, Alert, and Appropriate.   Current Psychosocial Factors: The patient reports that she has continued to work with her mother, who has been very depressed after mother's husband moved out.   The patient reports that she is also helping an elderly friend that is dying of cancer.  Content of Session:   Review current symptoms and work on therapeutic interventions for issues related to recurrent depression as well as anxiety.  Current Status:   The patient reports that she continues to show significant improvement in depression and anxiety.  The patient is exercising, going out more and taking more control of her life and not being as dependent upon her boyfriend.  Patient Progress:   Stable with improvement initially from psychotropic interventions.  Target Goals:   Target goals include reducing the intensity, severity, and duration of symptoms of depression including feelings of hopelessness and helplessness, social isolation, and withdrawal from others. The patient reports that she would also like to work specifically on reducing the frequency and intensity of her anxiety and panic-like symptoms  Last Reviewed:   08/24/2013  Goals Addressed Today:    Today we worked on Producer, television/film/video and strategies utilizing the cognitive approach around her interpretation of her situation in her life.  Impression/Diagnosis:   The patient has a long history of recurrent symptoms of depression without psychotic features. She also describes significant symptoms of anxiety and possible panic attack.  Diagnosis:    Axis I: Major depressive disorder, recurrent episode, moderate  Generalized  anxiety disorder   Eleanore Junio R, PsyD

## 2013-08-28 ENCOUNTER — Ambulatory Visit (INDEPENDENT_AMBULATORY_CARE_PROVIDER_SITE_OTHER): Payer: Self-pay | Admitting: Adult Health

## 2013-08-28 ENCOUNTER — Encounter: Payer: Self-pay | Admitting: Adult Health

## 2013-08-28 VITALS — BP 146/102 | HR 95 | Ht 62.0 in | Wt 163.0 lb

## 2013-08-28 DIAGNOSIS — I10 Essential (primary) hypertension: Secondary | ICD-10-CM

## 2013-08-28 DIAGNOSIS — E782 Mixed hyperlipidemia: Secondary | ICD-10-CM

## 2013-08-28 MED ORDER — HYDROCHLOROTHIAZIDE 25 MG PO TABS: 25.0000 mg | ORAL_TABLET | Freq: Every day | ORAL | Status: DC

## 2013-08-28 NOTE — Progress Notes (Deleted)
Name: Beth Sachdev Sanford    DOB: March 13, 1970  Age: 43 y.o.  MR#: [id number]       PCP:  No PCP Per Patient      Insurance: Payor: MED PAY / Plan: MED PAY ASSURANCE / Product Type: *No Product type* /   CC:    Chief Complaint  Patient presents with  . Hypertension    VS Filed Vitals:   08/28/13 1421  BP: 146/102  Pulse: 95  Height:  (1.575 m)  Weight: 163 lb (73.936 kg)  SpO2: 98%    Weights Current Weight  08/28/13 163 lb (73.936 kg)  07/05/13 165 lb (74.844 kg)  06/08/13 170 lb (77.111 kg)    Blood Pressure  BP Readings from Last 3 Encounters:  08/28/13 146/102  07/05/13 120/110  06/08/13 130/70     Admit date:  (Not on file) Last encounter with RMR:  Visit date not found   Allergy Codeine and Tramadol  Current Outpatient Prescriptions  Medication Sig Dispense Refill  . ALPRAZolam (XANAX) 1 MG tablet Take 1 tablet (1 mg total) by mouth 4 (four) times daily.  120 tablet  2  . Ca Carbonate-Mag Hydroxide (ROLAIDS PO) Take 2 tablets by mouth daily as needed (heartburn).      . citalopram (CELEXA) 20 MG tablet Take 1 tablet (20 mg total) by mouth daily.  30 tablet  2  . lisinopril (PRINIVIL,ZESTRIL) 20 MG tablet Take 1 tablet (20 mg total) by mouth at bedtime.  30 tablet  6  . simvastatin (ZOCOR) 20 MG tablet Take 1 tablet (20 mg total) by mouth at bedtime.  90 tablet  3  . traZODone (DESYREL) 100 MG tablet Take 100 mg by mouth at bedtime as needed.      . verapamil (CALAN) 80 MG tablet Take 1 tablet (80 mg total) by mouth 2 (two) times daily.  60 tablet  3   No current facility-administered medications for this visit.    Discontinued Meds:    Medications Discontinued During This Encounter  Medication Reason  . traZODone (DESYREL) 100 MG tablet     Patient Active Problem List   Diagnosis Date Noted  . Major depression 06/08/2013  . Chest pain 11/01/2012  . HTN (hypertension) 11/01/2012  . Hyperlipidemia 11/01/2012    LABS    Component Value Date/Time   NA  133* 10/19/2012 1423   K 3.8 10/19/2012 1423   CL 100 10/19/2012 1423   CO2 19 10/19/2012 1423   GLUCOSE 122* 10/19/2012 1423   BUN 5* 10/19/2012 1423   CREATININE 0.60 10/19/2012 1423   CALCIUM 8.8 10/19/2012 1423   GFRNONAA >90 10/19/2012 1423   GFRAA >90 10/19/2012 1423   CMP     Component Value Date/Time   NA 133* 10/19/2012 1423   K 3.8 10/19/2012 1423   CL 100 10/19/2012 1423   CO2 19 10/19/2012 1423   GLUCOSE 122* 10/19/2012 1423   BUN 5* 10/19/2012 1423   CREATININE 0.60 10/19/2012 1423   CALCIUM 8.8 10/19/2012 1423   GFRNONAA >90 10/19/2012 1423   GFRAA >90 10/19/2012 1423       Component Value Date/Time   WBC 11.4* 10/19/2012 1423   WBC 12.7* 05/12/2012 1435   HGB 15.4* 10/19/2012 1423   HGB 15.8* 05/12/2012 1435   HCT 44.4 10/19/2012 1423   HCT 46.0 05/12/2012 1435   MCV 95.3 10/19/2012 1423   MCV 94.5 05/12/2012 1435    Lipid Panel     Component Value  Date/Time   CHOL 173 04/06/2013 1333   TRIG 194* 04/06/2013 1333   HDL 44 04/06/2013 1333   CHOLHDL 3.9 04/06/2013 1333   VLDL 39 04/06/2013 1333   LDLCALC 90 04/06/2013 1333    ABG No results found for this basename: phart, pco2, pco2art, po2, po2art, hco3, tco2, acidbasedef, o2sat     No results found for this basename: TSH   BNP (last 3 results) No results found for this basename: PROBNP,  in the last 8760 hours Cardiac Panel (last 3 results) No results found for this basename: CKTOTAL, CKMB, TROPONINI, RELINDX,  in the last 72 hours  Iron/TIBC/Ferritin/ %Sat No results found for this basename: iron, tibc, ferritin, ironpctsat     EKG Orders placed during the hospital encounter of 10/19/12  . EKG 12-LEAD  . EKG 12-LEAD  . ED EKG  . ED EKG  . EKG     Prior Assessment and Plan Problem List as of 08/28/2013     Cardiovascular and Mediastinum   HTN (hypertension)     Other   Chest pain   Hyperlipidemia   Major depression       Imaging: No results found.

## 2013-08-28 NOTE — Assessment & Plan Note (Addendum)
I will restart HCTZ at 15 mg daily with repeat BMET in one week. She is advised also to decrease caffeine intake as she is drinking 2 liters of Dr. Reino Kent daily.    She snores but has not been evaluated for sleep apnea. This should be considered as she states she is not sleeping well. Will start with the easiest, by decreasing caffeine. I will also check TSH.  Her mother and brother have thyroid disease. She is also given a BP record to record her BP daily and bring back with her on next appt.

## 2013-08-28 NOTE — Patient Instructions (Signed)
Your physician recommends that you schedule a follow-up appointment in: 2 weeks with Joni Reining, NP  Your physician has recommended you make the following change in your medication:   START HCTZ 25 MG DAILY  Your physician recommends that you return for lab work in one week BMP/TSH  Your physician has requested that you regularly monitor and record your blood pressure readings at home. Please use the same machine at the same time of day to check your readings and record them to bring to your follow-up visit.  Thank you for choosing [facility] !       Thank you for choosing [facility] !

## 2013-08-28 NOTE — Assessment & Plan Note (Signed)
Followed by [facility]

## 2013-08-28 NOTE — Assessment & Plan Note (Signed)
Denies chest pain currently

## 2013-08-28 NOTE — Progress Notes (Signed)
HPI: Beth Sanford is a 43 year old patient of Dr. Purvis Sheffield, that we are following for ongoing assessment and management of hypertension, with grade 1 diastolic dysfunction. The patient had a recent stress echocardiogram showing reduced functional capacity for age and gender and no evidence of inducible ischemia with hypercontractile response to exercise. She also has a history of ongoing tobacco abuse and anxiety.  The patient was last seen by Dr. Purvis Sheffield in December of 2014, prescribed, one time dose of Xanax 0.5 mg when necessary anxiety, with no refills, and to follow up with primary. Blood pressure was well controlled. Tobacco counseling was provided. She was started on simvastatin 20 mg daily with followup labs in 3 months per primary care. She was to followup when necessary.  She comes today with complaints of elevated BP. She is symptomatic with this, pounding in her ears, restlessness. She had been taken off of HCTZ in the past for reasons she does not remember. She went to [facility] to have her BP checked and it was found to be elevated at 157/102 with HR of 97. She brings with her the copy of the recording from EMT.  She admits to drinking a lot of caffeine daily, not sleeping well, and suffering from anxiety. She is seen by [facility].   Allergies  Allergen Reactions  . Codeine Itching and Nausea Only  . Tramadol Nausea Only    Current Outpatient Prescriptions  Medication Sig Dispense Refill  . ALPRAZolam (XANAX) 1 MG tablet Take 1 tablet (1 mg total) by mouth 4 (four) times daily.  120 tablet  2  . Ca Carbonate-Mag Hydroxide (ROLAIDS PO) Take 2 tablets by mouth daily as needed (heartburn).      . citalopram (CELEXA) 20 MG tablet Take 1 tablet (20 mg total) by mouth daily.  30 tablet  2  . lisinopril (PRINIVIL,ZESTRIL) 20 MG tablet Take 1 tablet (20 mg total) by mouth at bedtime.  30 tablet  6  . simvastatin (ZOCOR) 20 MG tablet Take 1 tablet (20 mg total)  by mouth at bedtime.  90 tablet  3  . traZODone (DESYREL) 100 MG tablet Take 100 mg by mouth at bedtime as needed.      . verapamil (CALAN) 80 MG tablet Take 1 tablet (80 mg total) by mouth 2 (two) times daily.  60 tablet  3  . hydrochlorothiazide (HYDRODIURIL) 25 MG tablet Take 1 tablet (25 mg total) by mouth daily.  30 tablet  3   No current facility-administered medications for this visit.    Past Medical History  Diagnosis Date  . Hypertension   . High cholesterol   . Anxiety   . Depression     Past Surgical History  Procedure Laterality Date  . Cholecystectomy    . Elbow surgery  left    ROS: Review of systems complete and found to be negative unless listed above  PHYSICAL EXAM BP 146/102  Pulse 95  Ht  (1.575 m)  Wt 163 lb (73.936 kg)  BMI 29.81 kg/m2  SpO2 98% General: Well developed, well nourished, in no acute distress Head: Eyes PERRLA, No xanthomas.   Normal cephalic and atramatic  Lungs: Clear bilaterally to auscultation and percussion. Heart: HRRR S1 S2, tachycardic, without MRG.  Pulses are 2+ & equal.            No carotid bruit. No JVD.  No abdominal bruits. No femoral bruits. Abdomen: Bowel sounds are positive, abdomen soft and non-tender without  masses or                  Hernia's noted. Msk:  Back normal, normal gait. Normal strength and tone for age. Extremities: No clubbing, cyanosis or edema.  DP +1 Neuro: Alert and oriented X 3. Psych:  Good affect, responds appropriately  ASSESSMENT AND PLAN

## 2013-09-05 ENCOUNTER — Ambulatory Visit (HOSPITAL_COMMUNITY): Payer: Self-pay | Admitting: Psychiatry

## 2013-09-07 ENCOUNTER — Encounter (HOSPITAL_COMMUNITY): Payer: Self-pay | Admitting: Psychiatry

## 2013-09-07 ENCOUNTER — Ambulatory Visit (INDEPENDENT_AMBULATORY_CARE_PROVIDER_SITE_OTHER): Payer: Self-pay | Admitting: Psychiatry

## 2013-09-07 VITALS — BP 140/98 | Ht 62.0 in | Wt 164.0 lb

## 2013-09-07 DIAGNOSIS — F331 Major depressive disorder, recurrent, moderate: Secondary | ICD-10-CM

## 2013-09-07 DIAGNOSIS — F411 Generalized anxiety disorder: Secondary | ICD-10-CM

## 2013-09-07 DIAGNOSIS — F332 Major depressive disorder, recurrent severe without psychotic features: Secondary | ICD-10-CM

## 2013-09-07 MED ORDER — MIRTAZAPINE 30 MG PO TABS: 30.0000 mg | ORAL_TABLET | Freq: Every day | ORAL | Status: DC

## 2013-09-07 MED ORDER — ALPRAZOLAM 1 MG PO TABS: 1.0000 mg | ORAL_TABLET | Freq: Four times a day (QID) | ORAL | Status: DC

## 2013-09-07 MED ORDER — CITALOPRAM HYDROBROMIDE 20 MG PO TABS: 20.0000 mg | ORAL_TABLET | Freq: Every day | ORAL | Status: DC
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
## 2013-09-10 ENCOUNTER — Emergency Department (HOSPITAL_COMMUNITY): Payer: Self-pay

## 2013-09-10 ENCOUNTER — Emergency Department (HOSPITAL_COMMUNITY)
Admission: EM | Admit: 2013-09-10 | Discharge: 2013-09-10 | Disposition: A | Discharge status: Home / Self Care | Payer: Self-pay | Attending: Emergency Medicine | Admitting: Emergency Medicine

## 2013-09-10 ENCOUNTER — Encounter (HOSPITAL_COMMUNITY): Payer: Self-pay | Admitting: Emergency Medicine

## 2013-09-10 DIAGNOSIS — I10 Essential (primary) hypertension: Secondary | ICD-10-CM | POA: Insufficient documentation

## 2013-09-10 DIAGNOSIS — S2239XA Fracture of one rib, unspecified side, initial encounter for closed fracture: Secondary | ICD-10-CM | POA: Insufficient documentation

## 2013-09-10 DIAGNOSIS — S298XXA Other specified injuries of thorax, initial encounter: Secondary | ICD-10-CM | POA: Insufficient documentation

## 2013-09-10 DIAGNOSIS — E7800 Pure hypercholesterolemia, unspecified: Secondary | ICD-10-CM | POA: Insufficient documentation

## 2013-09-10 DIAGNOSIS — Y9289 Other specified places as the place of occurrence of the external cause: Secondary | ICD-10-CM | POA: Insufficient documentation

## 2013-09-10 DIAGNOSIS — F411 Generalized anxiety disorder: Secondary | ICD-10-CM | POA: Insufficient documentation

## 2013-09-10 DIAGNOSIS — F3289 Other specified depressive episodes: Secondary | ICD-10-CM | POA: Insufficient documentation

## 2013-09-10 DIAGNOSIS — Z79899 Other long term (current) drug therapy: Secondary | ICD-10-CM | POA: Insufficient documentation

## 2013-09-10 DIAGNOSIS — S2231XA Fracture of one rib, right side, initial encounter for closed fracture: Secondary | ICD-10-CM

## 2013-09-10 DIAGNOSIS — F329 Major depressive disorder, single episode, unspecified: Secondary | ICD-10-CM | POA: Insufficient documentation

## 2013-09-10 DIAGNOSIS — Y9389 Activity, other specified: Secondary | ICD-10-CM | POA: Insufficient documentation

## 2013-09-10 DIAGNOSIS — Z87891 Personal history of nicotine dependence: Secondary | ICD-10-CM | POA: Insufficient documentation

## 2013-09-10 DIAGNOSIS — W1809XA Striking against other object with subsequent fall, initial encounter: Secondary | ICD-10-CM | POA: Insufficient documentation

## 2013-09-10 MED ORDER — HYDROMORPHONE HCL PF 1 MG/ML IJ SOLN
1.0000 mg | Freq: Once | INTRAMUSCULAR | Status: AC
  Administered 2013-09-10: 1 mg via INTRAMUSCULAR

## 2013-09-10 MED ORDER — OXYCODONE-ACETAMINOPHEN 5-325 MG PO TABS: 1.0000 | ORAL_TABLET | Freq: Four times a day (QID) | ORAL | Status: DC | PRN

## 2013-09-10 MED ORDER — HYDROMORPHONE HCL PF 1 MG/ML IJ SOLN
INTRAMUSCULAR | Status: AC
  Filled 2013-09-10: qty 1

## 2013-09-10 NOTE — ED Notes (Signed)
Pt fall after slipping on water in bathroom floor last night, states landed on right side, c/o right rib pain and pain with deep breathes, hit head on floor, denies LOC

## 2013-09-10 NOTE — ED Provider Notes (Signed)
CSN: [id number]     Arrival date & time 09/10/13  1856 History   First MD Initiated Contact with Patient 09/10/13 1908     Chief Complaint  Patient presents with  . Fall     (Consider location/radiation/quality/duration/timing/severity/associated sxs/prior Treatment) Patient is a 43 y.o. female presenting with fall. The history is provided by the patient (the pt states she fell and hit her chest and head yesterday no loc).  Fall This is a new problem. The current episode started yesterday. The problem occurs constantly. The problem has not changed since onset.Associated symptoms include chest pain. Pertinent negatives include no abdominal pain and no headaches. Exacerbated by: breathing. Nothing relieves the symptoms.    Past Medical History  Diagnosis Date  . Hypertension   . High cholesterol   . Anxiety   . Depression    Past Surgical History  Procedure Laterality Date  . Cholecystectomy    . Elbow surgery  left   Family History  Problem Relation Age of Onset  . Hypertension Brother   . Bipolar disorder Brother   . Alcohol abuse Father    History  Substance Use Topics  . Smoking status: Former Smoker -- 0.50 packs/day for 35 years  . Smokeless tobacco: Not on file  . Alcohol Use: No   OB History   Grav Para Term Preterm Abortions TAB SAB Ect Mult Living   Review of Systems  Constitutional: Negative for appetite change and fatigue.  HENT: Negative for congestion, ear discharge and sinus pressure.   Eyes: Negative for discharge.  Respiratory: Negative for cough.   Cardiovascular: Positive for chest pain.  Gastrointestinal: Negative for abdominal pain and diarrhea.  Genitourinary: Negative for frequency and hematuria.  Musculoskeletal: Negative for back pain.  Skin: Negative for rash.  Neurological: Negative for seizures and headaches.  Psychiatric/Behavioral: Negative for hallucinations.      Allergies  Codeine and Tramadol  Home  Medications   Prior to Admission medications   Medication Sig Start Date End Date Taking? Authorizing Provider  ALPRAZolam Prudy Feeler) 1 MG tablet Take 1 tablet (1 mg total) by mouth 4 (four) times daily. 09/07/13 09/07/14  Diannia Ruder, MD  Ca Carbonate-Mag Hydroxide (ROLAIDS PO) Take 2 tablets by mouth daily as needed (heartburn).    Historical Provider, MD  citalopram (CELEXA) 20 MG tablet Take 1 tablet (20 mg total) by mouth daily. 09/07/13 09/07/14  Diannia Ruder, MD  hydrochlorothiazide (HYDRODIURIL) 25 MG tablet Take 1 tablet (25 mg total) by mouth daily. 08/28/13   Jodelle Gross, NP  lisinopril (PRINIVIL,ZESTRIL) 20 MG tablet Take 1 tablet (20 mg total) by mouth at bedtime. 07/26/13   Laqueta Linden, MD  mirtazapine (REMERON) 30 MG tablet Take 1 tablet (30 mg total) by mouth at bedtime. 09/07/13 09/07/14  Diannia Ruder, MD  oxyCODONE-acetaminophen (PERCOCET/ROXICET) 5-325 MG per tablet Take 1 tablet by mouth every 6 (six) hours as needed. 09/10/13   Benny Lennert, MD  simvastatin (ZOCOR) 20 MG tablet Take 1 tablet (20 mg total) by mouth at bedtime. 12/27/12   Laqueta Linden, MD  traZODone (DESYREL) 100 MG tablet Take 100 mg by mouth at bedtime as needed. 07/05/13   Diannia Ruder, MD  verapamil (CALAN) 80 MG tablet Take 1 tablet (80 mg total) by mouth 2 (two) times daily. 03/22/13   Laqueta Linden, MD   BP 154/124  Pulse 62  Temp(Src) 98.6 F (  37 C) (Oral)  Resp 18  Ht 5' 2.5" (1.588 m)  Wt 164 lb (74.39 kg)  BMI 29.50 kg/m2  SpO2 97%  LMP 09/07/2013 Physical Exam  Constitutional: She is oriented to person, place, and time. She appears well-developed.  HENT:  Head: Normocephalic.  Eyes: Conjunctivae and EOM are normal. No scleral icterus.  Neck: Neck supple. No thyromegaly present.  Cardiovascular: Normal rate and regular rhythm.  Exam reveals no gallop and no friction rub.   No murmur heard. Pulmonary/Chest: No stridor. She has no wheezes. She has no rales. She exhibits  tenderness.  Abdominal: She exhibits no distension. There is no tenderness. There is no rebound.  Musculoskeletal: Normal range of motion. She exhibits no edema.  Lymphadenopathy:    She has no cervical adenopathy.  Neurological: She is oriented to person, place, and time. She exhibits normal muscle tone. Coordination normal.  Skin: No rash noted. No erythema.  Psychiatric: She has a normal mood and affect. Her behavior is normal.    ED Course  Procedures (including critical care time) Labs Review Labs Reviewed - No data to display  Imaging Review Dg Ribs Unilateral W/chest Right  09/10/2013   CLINICAL DATA:  Fall  EXAM: RIGHT RIBS AND CHEST - 3+ VIEW  COMPARISON:  None.  FINDINGS: Acute minimally displaced right lateral fifth rib fracture. Bibasilar atelectasis. No pneumothorax. Normal heart size.  IMPRESSION: Acute right fifth rib fracture.   Electronically Signed   By: Maryclare Bean M.D.   On: 09/10/2013 19:43   Ct Head Wo Contrast  09/10/2013   CLINICAL DATA:  Slipped in bathtub  EXAM: CT HEAD WITHOUT CONTRAST  CT CERVICAL SPINE WITHOUT CONTRAST  TECHNIQUE: Multidetector CT imaging of the head and cervical spine was performed following the standard protocol without intravenous contrast. Multiplanar CT image reconstructions of the cervical spine were also generated.  COMPARISON:  None.  FINDINGS: CT HEAD FINDINGS  No evidence of parenchymal hemorrhage or extra-axial fluid collection. No mass lesion, mass effect, or midline shift.  No CT evidence of acute infarction.  Cerebral volume is within normal limits.  No ventriculomegaly.  The visualized paranasal sinuses are essentially clear. The mastoid air cells are unopacified.  No evidence of calvarial fracture.  CT CERVICAL SPINE FINDINGS  Straightening of the cervical spine.  No evidence of fracture or dislocation. Vertebral body heights are maintained. Dens appears intact.  No prevertebral soft tissue swelling.  Mild multilevel degenerative changes.   Visualized thyroid is unremarkable.  Visualized lung apices are clear.  IMPRESSION: Normal head CT.  Normal cervical spine CT.   Electronically Signed   By: Charline Bills M.D.   On: 09/10/2013 19:52   Ct Cervical Spine Wo Contrast  09/10/2013   CLINICAL DATA:  Slipped in bathtub  EXAM: CT HEAD WITHOUT CONTRAST  CT CERVICAL SPINE WITHOUT CONTRAST  TECHNIQUE: Multidetector CT imaging of the head and cervical spine was performed following the standard protocol without intravenous contrast. Multiplanar CT image reconstructions of the cervical spine were also generated.  COMPARISON:  None.  FINDINGS: CT HEAD FINDINGS  No evidence of parenchymal hemorrhage or extra-axial fluid collection. No mass lesion, mass effect, or midline shift.  No CT evidence of acute infarction.  Cerebral volume is within normal limits.  No ventriculomegaly.  The visualized paranasal sinuses are essentially clear. The mastoid air cells are unopacified.  No evidence of calvarial fracture.  CT CERVICAL SPINE FINDINGS  Straightening of the cervical spine.  No evidence of fracture or  dislocation. Vertebral body heights are maintained. Dens appears intact.  No prevertebral soft tissue swelling.  Mild multilevel degenerative changes.  Visualized thyroid is unremarkable.  Visualized lung apices are clear.  IMPRESSION: Normal head CT.  Normal cervical spine CT.   Electronically Signed   By: Charline Bills M.D.   On: 09/10/2013 19:52     EKG Interpretation None      MDM   Final diagnoses:  Right rib fracture, closed, initial encounter    fx rib.  tx with percocet    Benny Lennert, MD 09/10/13 2007

## 2013-09-10 NOTE — Discharge Instructions (Signed)
Follow up next week with your md for recheck °

## 2013-09-11 ENCOUNTER — Encounter: Payer: Self-pay | Admitting: Adult Health

## 2013-09-11 ENCOUNTER — Ambulatory Visit (INDEPENDENT_AMBULATORY_CARE_PROVIDER_SITE_OTHER): Payer: Self-pay | Admitting: Adult Health

## 2013-09-11 VITALS — BP 132/100 | HR 90 | Ht 62.0 in | Wt 166.0 lb

## 2013-09-11 DIAGNOSIS — E785 Hyperlipidemia, unspecified: Secondary | ICD-10-CM

## 2013-09-11 DIAGNOSIS — I10 Essential (primary) hypertension: Secondary | ICD-10-CM

## 2013-09-11 NOTE — Progress Notes (Deleted)
Name: Beth Sanford    DOB: [birth year]  Age: 43 y.o.  MR#: [id number]       PCP:  No PCP Per Patient      Insurance: Payor: MED PAY / Plan: MED PAY ASSURANCE / Product Type: *No Product type* /   CC:    Chief Complaint  Patient presents with  . Hypertension  . Nicotine Dependence    VS Filed Vitals:   09/11/13 1440  BP: 132/100  Pulse: 90  Height:  (1.575 m)  Weight: 166 lb (75.297 kg)  SpO2: 98%    Weights Current Weight  09/11/13 166 lb (75.297 kg)  09/10/13 164 lb (74.39 kg)  09/07/13 164 lb (74.39 kg)    Blood Pressure  BP Readings from Last 3 Encounters:  09/11/13 132/100  09/10/13 154/124  09/07/13 140/98     Admit date:  (Not on file) Last encounter with RMR:  08/28/2013   Allergy Codeine and Tramadol  Current Outpatient Prescriptions  Medication Sig Dispense Refill  . ALPRAZolam (XANAX) 1 MG tablet Take 1 tablet (1 mg total) by mouth 4 (four) times daily.  120 tablet  2  . Ca Carbonate-Mag Hydroxide (ROLAIDS PO) Take 2 tablets by mouth daily as needed (heartburn).      . citalopram (CELEXA) 20 MG tablet Take 1 tablet (20 mg total) by mouth daily.  30 tablet  2  . hydrochlorothiazide (HYDRODIURIL) 25 MG tablet Take 1 tablet (25 mg total) by mouth daily.  30 tablet  3  . lisinopril (PRINIVIL,ZESTRIL) 20 MG tablet Take 1 tablet (20 mg total) by mouth at bedtime.  30 tablet  6  . mirtazapine (REMERON) 30 MG tablet Take 1 tablet (30 mg total) by mouth at bedtime.  30 tablet  2  . oxyCODONE-acetaminophen (PERCOCET/ROXICET) 5-325 MG per tablet Take 1 tablet by mouth every 6 (six) hours as needed.  20 tablet  0  . simvastatin (ZOCOR) 20 MG tablet Take 1 tablet (20 mg total) by mouth at bedtime.  90 tablet  3  . verapamil (CALAN) 80 MG tablet Take 1 tablet (80 mg total) by mouth 2 (two) times daily.  60 tablet  3   No current facility-administered medications for this visit.    Discontinued Meds:    Medications Discontinued During This Encounter  Medication  Reason  . traZODone (DESYREL) 100 MG tablet Error    Patient Active Problem List   Diagnosis Date Noted  . Major depression 06/08/2013  . Chest pain 11/01/2012  . HTN (hypertension) 11/01/2012  . Hyperlipidemia 11/01/2012    LABS    Component Value Date/Time   NA 133* 10/19/2012 1423   K 3.8 10/19/2012 1423   CL 100 10/19/2012 1423   CO2 19 10/19/2012 1423   GLUCOSE 122* 10/19/2012 1423   BUN 5* 10/19/2012 1423   CREATININE 0.60 10/19/2012 1423   CALCIUM 8.8 10/19/2012 1423   GFRNONAA >90 10/19/2012 1423   GFRAA >90 10/19/2012 1423   CMP     Component Value Date/Time   NA 133* 10/19/2012 1423   K 3.8 10/19/2012 1423   CL 100 10/19/2012 1423   CO2 19 10/19/2012 1423   GLUCOSE 122* 10/19/2012 1423   BUN 5* 10/19/2012 1423   CREATININE 0.60 10/19/2012 1423   CALCIUM 8.8 10/19/2012 1423   GFRNONAA >90 10/19/2012 1423   GFRAA >90 10/19/2012 1423       Component Value Date/Time   WBC 11.4* 10/19/2012 1423   WBC 12.7* 05/12/2012  1435   HGB 15.4* 10/19/2012 1423   HGB 15.8* 05/12/2012 1435   HCT 44.4 10/19/2012 1423   HCT 46.0 05/12/2012 1435   MCV 95.3 10/19/2012 1423   MCV 94.5 05/12/2012 1435    Lipid Panel     Component Value Date/Time   CHOL 173 04/06/2013 1333   TRIG 194* 04/06/2013 1333   HDL 44 04/06/2013 1333   CHOLHDL 3.9 04/06/2013 1333   VLDL 39 04/06/2013 1333   LDLCALC 90 04/06/2013 1333    ABG No results found for this basename: phart, pco2, pco2art, po2, po2art, hco3, tco2, acidbasedef, o2sat     No results found for this basename: TSH   BNP (last 3 results) No results found for this basename: PROBNP,  in the last 8760 hours Cardiac Panel (last 3 results) No results found for this basename: CKTOTAL, CKMB, TROPONINI, RELINDX,  in the last 72 hours  Iron/TIBC/Ferritin/ %Sat No results found for this basename: iron, tibc, ferritin, ironpctsat     EKG Orders placed in visit on 09/11/13  . EKG 12-LEAD     Prior Assessment and Plan Problem List as of  09/11/2013     Cardiovascular and Mediastinum   HTN (hypertension)   Last Assessment & Plan   08/28/2013 Office Visit Edited 08/28/2013  3:13 PM by Jodelle Gross, NP     I will restart HCTZ at 15 mg daily with repeat BMET in one week. She is advised also to decrease caffeine intake as she is drinking 2 liters of Dr. Reino Kent daily.    She snores but has not been evaluated for sleep apnea. This should be considered as she states she is not sleeping well. Will start with the easiest, by decreasing caffeine. I will also check TSH.  Her mother and brother have thyroid disease. She is also given a BP record to record her BP daily and bring back with her on next appt.      Other   Chest pain   Last Assessment & Plan   08/28/2013 Office Visit Written 08/28/2013  3:12 PM by Jodelle Gross, NP     Denies chest pain currently.     Hyperlipidemia   Major depression   Last Assessment & Plan   08/28/2013 Office Visit Written 08/28/2013  3:13 PM by Jodelle Gross, NP     Followed by Behavioral Health        Imaging: Dg Ribs Unilateral W/chest Right  09/10/2013   CLINICAL DATA:  Fall  EXAM: RIGHT RIBS AND CHEST - 3+ VIEW  COMPARISON:  None.  FINDINGS: Acute minimally displaced right lateral fifth rib fracture. Bibasilar atelectasis. No pneumothorax. Normal heart size.  IMPRESSION: Acute right fifth rib fracture.   Electronically Signed   By: Maryclare Bean M.D.   On: 09/10/2013 19:43   Ct Head Wo Contrast  09/10/2013   CLINICAL DATA:  Slipped in bathtub  EXAM: CT HEAD WITHOUT CONTRAST  CT CERVICAL SPINE WITHOUT CONTRAST  TECHNIQUE: Multidetector CT imaging of the head and cervical spine was performed following the standard protocol without intravenous contrast. Multiplanar CT image reconstructions of the cervical spine were also generated.  COMPARISON:  None.  FINDINGS: CT HEAD FINDINGS  No evidence of parenchymal hemorrhage or extra-axial fluid collection. No mass lesion, mass effect, or midline shift.   No CT evidence of acute infarction.  Cerebral volume is within normal limits.  No ventriculomegaly.  The visualized paranasal sinuses are essentially clear. The mastoid air cells are unopacified.  No evidence of calvarial fracture.  CT CERVICAL SPINE FINDINGS  Straightening of the cervical spine.  No evidence of fracture or dislocation. Vertebral body heights are maintained. Dens appears intact.  No prevertebral soft tissue swelling.  Mild multilevel degenerative changes.  Visualized thyroid is unremarkable.  Visualized lung apices are clear.  IMPRESSION: Normal head CT.  Normal cervical spine CT.   Electronically Signed   By: Charline Bills M.D.   On: 09/10/2013 19:52   Ct Cervical Spine Wo Contrast  09/10/2013   CLINICAL DATA:  Slipped in bathtub  EXAM: CT HEAD WITHOUT CONTRAST  CT CERVICAL SPINE WITHOUT CONTRAST  TECHNIQUE: Multidetector CT imaging of the head and cervical spine was performed following the standard protocol without intravenous contrast. Multiplanar CT image reconstructions of the cervical spine were also generated.  COMPARISON:  None.  FINDINGS: CT HEAD FINDINGS  No evidence of parenchymal hemorrhage or extra-axial fluid collection. No mass lesion, mass effect, or midline shift.  No CT evidence of acute infarction.  Cerebral volume is within normal limits.  No ventriculomegaly.  The visualized paranasal sinuses are essentially clear. The mastoid air cells are unopacified.  No evidence of calvarial fracture.  CT CERVICAL SPINE FINDINGS  Straightening of the cervical spine.  No evidence of fracture or dislocation. Vertebral body heights are maintained. Dens appears intact.  No prevertebral soft tissue swelling.  Mild multilevel degenerative changes.  Visualized thyroid is unremarkable.  Visualized lung apices are clear.  IMPRESSION: Normal head CT.  Normal cervical spine CT.   Electronically Signed   By: Charline Bills M.D.   On: 09/10/2013 19:52

## 2013-09-11 NOTE — Assessment & Plan Note (Signed)
Labs are being monitored by her PCP>

## 2013-09-11 NOTE — Patient Instructions (Signed)
Your physician recommends that you schedule a follow-up appointment in: 3 months  Your physician recommends that you continue on your current medications as directed. Please refer to the Current Medication list given to you today.  Thank you for choosing [facility]!!    

## 2013-09-11 NOTE — Assessment & Plan Note (Signed)
It is difficult to ascertain if BP elevation is now related to pain from rib fx or need to increase antihypertensive medications. She has multiple somatic complaints with anxiety also playing a role. She will have her BP checked to calibrate her BP machine with ours. NO changes in her BP medications at this time until she is healed from the rib fx as she is not dangerously elevated. Still want to consider sleep study sometime in the future.

## 2013-09-16 ENCOUNTER — Encounter (HOSPITAL_COMMUNITY): Payer: Self-pay | Admitting: Emergency Medicine

## 2013-09-16 ENCOUNTER — Emergency Department (HOSPITAL_COMMUNITY)
Admission: EM | Admit: 2013-09-16 | Discharge: 2013-09-16 | Disposition: A | Discharge status: Home / Self Care | Payer: No Typology Code available for payment source | Attending: Emergency Medicine | Admitting: Emergency Medicine

## 2013-09-16 ENCOUNTER — Emergency Department (HOSPITAL_COMMUNITY): Payer: No Typology Code available for payment source

## 2013-09-16 DIAGNOSIS — F3289 Other specified depressive episodes: Secondary | ICD-10-CM | POA: Insufficient documentation

## 2013-09-16 DIAGNOSIS — R079 Chest pain, unspecified: Secondary | ICD-10-CM | POA: Insufficient documentation

## 2013-09-16 DIAGNOSIS — E7800 Pure hypercholesterolemia, unspecified: Secondary | ICD-10-CM | POA: Insufficient documentation

## 2013-09-16 DIAGNOSIS — I10 Essential (primary) hypertension: Secondary | ICD-10-CM | POA: Insufficient documentation

## 2013-09-16 DIAGNOSIS — S2231XS Fracture of one rib, right side, sequela: Secondary | ICD-10-CM

## 2013-09-16 DIAGNOSIS — F329 Major depressive disorder, single episode, unspecified: Secondary | ICD-10-CM | POA: Insufficient documentation

## 2013-09-16 DIAGNOSIS — IMO0001 Reserved for inherently not codable concepts without codable children: Secondary | ICD-10-CM | POA: Insufficient documentation

## 2013-09-16 DIAGNOSIS — Z79899 Other long term (current) drug therapy: Secondary | ICD-10-CM | POA: Insufficient documentation

## 2013-09-16 DIAGNOSIS — F411 Generalized anxiety disorder: Secondary | ICD-10-CM | POA: Insufficient documentation

## 2013-09-16 DIAGNOSIS — Z87891 Personal history of nicotine dependence: Secondary | ICD-10-CM | POA: Insufficient documentation

## 2013-09-16 MED ORDER — LORAZEPAM 1 MG PO TABS
1.0000 mg | ORAL_TABLET | Freq: Once | ORAL | Status: AC
  Administered 2013-09-16: 1 mg via ORAL
  Filled 2013-09-16: qty 1

## 2013-09-16 MED ORDER — ONDANSETRON 4 MG PO TBDP
4.0000 mg | ORAL_TABLET | Freq: Once | ORAL | Status: AC
  Administered 2013-09-16: 4 mg via ORAL
  Filled 2013-09-16: qty 1

## 2013-09-16 MED ORDER — HYDROMORPHONE HCL PF 1 MG/ML IJ SOLN
1.0000 mg | Freq: Once | INTRAMUSCULAR | Status: AC
  Administered 2013-09-16: 1 mg via INTRAMUSCULAR
  Filled 2013-09-16: qty 1

## 2013-09-16 MED ORDER — OXYCODONE-ACETAMINOPHEN 5-325 MG PO TABS: 1.0000 | ORAL_TABLET | Freq: Four times a day (QID) | ORAL | Status: DC | PRN

## 2013-09-16 NOTE — Discharge Instructions (Signed)
Follow up with provider Hyman Bower.  [phone]

## 2013-09-16 NOTE — ED Provider Notes (Signed)
CSN: [id number]     Arrival date & time 09/16/13  1622 History  This chart was scribed for Benny Lennert, MD by Leone Payor, ED Scribe. This patient was seen in room APA06/APA06 and the patient's care was started 4:46 PM.    Chief Complaint  Patient presents with  . Rib Fracture    Patient is a 43 y.o. female presenting with chest pain. The history is provided by the patient. No language interpreter was used.  Chest Pain Pain location:  R lateral chest Pain radiates to:  Does not radiate Pain radiates to the back: no   Pain severity:  Moderate Duration:  6 days Timing:  Constant Progression:  Unchanged Chronicity:  New Context: trauma   Worsened by:  Deep breathing Associated symptoms: no abdominal pain, no back pain, no cough, no fatigue and no headache     HPI Comments: Beth Sanford is a 44 y.o. female who presents to the Emergency Department complaining of continued, constant, unchanged right lateral chest pain that began 6 days ago. Patient was seen on 09/10/13 for a fall and was diagnosed with a right rib fracture. She was discharged home at that time and was prescribed Percocet. She reports the pain continues and is worse with deep breathing. She denies fever, SOB.   Past Medical History  Diagnosis Date  . Hypertension   . High cholesterol   . Anxiety   . Depression    Past Surgical History  Procedure Laterality Date  . Cholecystectomy    . Elbow surgery  left   Family History  Problem Relation Age of Onset  . Hypertension Brother   . Bipolar disorder Brother   . Alcohol abuse Father    History  Substance Use Topics  . Smoking status: Former Smoker -- 0.50 packs/day for 35 years  . Smokeless tobacco: Never Used  . Alcohol Use: No   OB History   Grav Para Term Preterm Abortions TAB SAB Ect Mult Living   Review of Systems  Constitutional: Negative for appetite change and fatigue.  HENT: Negative for congestion, ear discharge and sinus  pressure.   Eyes: Negative for discharge.  Respiratory: Negative for cough.   Cardiovascular: Positive for chest pain.  Gastrointestinal: Negative for abdominal pain and diarrhea.  Genitourinary: Negative for frequency and hematuria.  Musculoskeletal: Negative for back pain.  Skin: Negative for rash.  Neurological: Negative for seizures and headaches.  Psychiatric/Behavioral: Negative for hallucinations.      Allergies  Codeine and Tramadol  Home Medications   Prior to Admission medications   Medication Sig Start Date End Date Taking? Authorizing Provider  lisinopril (PRINIVIL,ZESTRIL) 20 MG tablet Take 20 mg by mouth daily.   Yes Historical Provider, MD  mirtazapine (REMERON) 30 MG tablet Take 1 tablet (30 mg total) by mouth at bedtime. 09/07/13 09/07/14 Yes Diannia Ruder, MD  oxyCODONE-acetaminophen (PERCOCET/ROXICET) 5-325 MG per tablet Take 1 tablet by mouth every 6 (six) hours as needed. 09/10/13  Yes Benny Lennert, MD  simvastatin (ZOCOR) 20 MG tablet Take 1 tablet (20 mg total) by mouth at bedtime. 12/27/12  Yes Laqueta Linden, MD  verapamil (CALAN) 80 MG tablet Take 1 tablet (80 mg total) by mouth 2 (two) times daily. 03/22/13  Yes Laqueta Linden, MD  ALPRAZolam Prudy Feeler) 1 MG tablet Take 1 tablet (1 mg total) by mouth 4 (four) times daily. 09/07/13 09/07/14  Gavin Pound  Tenny Craw, MD  Ca Carbonate-Mag Hydroxide (ROLAIDS PO) Take 2 tablets by mouth daily as needed (heartburn).    Historical Provider, MD  citalopram (CELEXA) 20 MG tablet Take 1 tablet (20 mg total) by mouth daily. 09/07/13 09/07/14  Diannia Ruder, MD  hydrochlorothiazide (HYDRODIURIL) 25 MG tablet Take 1 tablet (25 mg total) by mouth daily. 08/28/13   Jodelle Gross, NP   BP 111/62  Pulse 109  Temp(Src) 98.5 F (36.9 C) (Oral)  Resp 24  Wt 166 lb (75.297 kg)  SpO2 97%  LMP 09/07/2013 Physical Exam  Nursing note and vitals reviewed. Constitutional: She is oriented to person, place, and time. She appears  well-developed.  HENT:  Head: Normocephalic.  Eyes: Conjunctivae are normal.  Neck: No tracheal deviation present.  Cardiovascular:  No murmur heard. Pulmonary/Chest: She exhibits tenderness (Tenderness to palpation over right lateral chest ).  Musculoskeletal: Normal range of motion.  Neurological: She is oriented to person, place, and time.  Skin: Skin is warm.  Psychiatric: She has a normal mood and affect.    ED Course  Procedures (including critical care time)  DIAGNOSTIC STUDIES: Oxygen Saturation is 97% on RA, adequate by my interpretation.    COORDINATION OF CARE: 4:49 PM Will order CXR and give pain medication. Discussed treatment plan with pt at bedside and pt agreed to plan.   Labs Review Labs Reviewed - No data to display  Imaging Review No results found.   EKG Interpretation None      MDM   Final diagnoses:  None   Old rib fx  The chart was scribed for me under my direct supervision.  I personally performed the history, physical, and medical decision making and all procedures in the evaluation of this patient.Benny Lennert, MD 09/16/13 Rickey Primus

## 2013-09-16 NOTE — ED Notes (Signed)
Pt seen Monday and dx with rib fracture to right side, pt ran out of pain meds last night, pt c/o pain with breathing

## 2013-09-18 ENCOUNTER — Ambulatory Visit (INDEPENDENT_AMBULATORY_CARE_PROVIDER_SITE_OTHER): Payer: Self-pay | Admitting: Psychology

## 2013-09-18 ENCOUNTER — Encounter (HOSPITAL_COMMUNITY): Payer: Self-pay | Admitting: Psychology

## 2013-09-18 DIAGNOSIS — F411 Generalized anxiety disorder: Secondary | ICD-10-CM

## 2013-09-18 DIAGNOSIS — F331 Major depressive disorder, recurrent, moderate: Secondary | ICD-10-CM

## 2013-09-18 NOTE — Progress Notes (Signed)
   PROGRESS NOTE  Patient:  Beth Sanford   DOB: August 01, 1970  MR Number: [id number]  Location: [facility]AVIOR[facility] [address] [place] [address] Dept: [phone]  Start: 2 PM End: 3 PM  Provider/Observer:     Hershal Coria PSYD  Chief Complaint:      Chief Complaint  Patient presents with  . Anxiety  . Depression  . Stress    Reason For Service:     The patient was referred by Dr. Tenny Craw for psychotherapeutic interventions. The patient reports that she has had significant troubles with depression for many years. She reports that she had been seen Dr. Tiburcio Pea for psychiatric services as well as a counselor and like them both by Dr. Tiburcio Pea moved her office to [facility]. She then tried a new psychiatrist and did not particularly related to them very well and stopped seeing him. She is now return in and started seeing Dr. Tenny Craw. The patient reports that she's been without her medications for depression and anxiety for some time. The patient reports that she has started back on her psychotropic medications and has been feeling better. However, she reports that she continues to be "stressed out" and has recurrent panic attacks. The patient also significant medical issues including congestive heart disease and COPD. The patient reports that while she has been feeling better she has continued to have a lot of anxiety and depression. Reports that she is in the process of quitting smoking cigarettes and has only been using E. cigarette/vapor and is also trying to stop that as well. The patient reports that there continues to be a lot of stress between her and her long-time boyfriend and she reports that he has not been time for related to work very well at all.  Interventions Strategy:  Cognitive/behavioral psychotherapeutic interventions  Participation Level:   Active  Participation Quality:  Appropriate       Behavioral Observation:  Well Groomed, Alert, and Appropriate.   Current Psychosocial Factors: The patient reports she continues to be depressed and having difficulties with mother, who has her own problems.  Content of Session:   Review current symptoms and work on therapeutic interventions for issues related to recurrent depression as well as anxiety.  Current Status:   The patient reports that she continues to report depression but feels that it is some better.  She reports that now that she stop trazodone and stated Remeron she has stopped her vivid dreams.  Patient Progress:   Stable with improvement initially from psychotropic interventions.  Target Goals:   Target goals include reducing the intensity, severity, and duration of symptoms of depression including feelings of hopelessness and helplessness, social isolation, and withdrawal from others. The patient reports that she would also like to work specifically on reducing the frequency and intensity of her anxiety and panic-like symptoms  Last Reviewed:   09/18/2013  Goals Addressed Today:    Today we worked on Producer, television/film/video and strategies utilizing the cognitive approach around her interpretation of her situation in her life.  Impression/Diagnosis:   The patient has a long history of recurrent symptoms of depression without psychotic features. She also describes significant symptoms of anxiety and possible panic attack.  Diagnosis:    Axis I: Major depressive disorder, recurrent episode, moderate  Generalized anxiety disorder   Jabre Heo R, PsyD

## 2013-10-16 ENCOUNTER — Encounter (HOSPITAL_COMMUNITY): Payer: Self-pay | Admitting: Psychology

## 2013-10-16 ENCOUNTER — Ambulatory Visit (INDEPENDENT_AMBULATORY_CARE_PROVIDER_SITE_OTHER): Payer: Self-pay | Admitting: Psychology

## 2013-10-16 DIAGNOSIS — F331 Major depressive disorder, recurrent, moderate: Secondary | ICD-10-CM

## 2013-10-16 DIAGNOSIS — F411 Generalized anxiety disorder: Secondary | ICD-10-CM

## 2013-10-16 NOTE — Progress Notes (Signed)
   PROGRESS NOTE  Patient:  Beth Sanford   DOB: 07/02/1970  MR Number: [id number]  Location: [facility] [address] [place] [id number] Dept: [phone]  Start: 1 PM End: 2 PM  Provider/Observer:     Hershal CoriaJohn R Rodenbough PSYD  Chief Complaint:      Chief Complaint  Patient presents with  . Anxiety  . Depression    Reason For Service:     The patient was referred by Dr. Tenny Crawoss for psychotherapeutic interventions. The patient reports that she has had significant troubles with depression for many years. She reports that she had been seen Dr. Tiburcio PeaHarris for psychiatric services as well as a counselor and like them both by Dr. [facility] [place] [facility] She then tried a new psychiatrist and did not particularly related to them very well and stopped seeing him. She is now return in and started seeing Dr. Tenny Crawoss. The patient reports that she's been without her medications for depression and anxiety for some time. The patient reports that she has started back on her psychotropic medications and has been feeling better. However, she reports that she continues to be "stressed out" and has recurrent panic attacks. The patient also significant medical issues including congestive heart disease and COPD. The patient reports that while she has been feeling better she has continued to have a lot of anxiety and depression. Reports that she is in the process of quitting smoking cigarettes and has only been using E. cigarette/vapor and is also trying to stop that as well. The patient reports that there continues to be a lot of stress between her and her long-time boyfriend and she reports that he has not been time for related to work very well at all.  Interventions Strategy:  Cognitive/behavioral psychotherapeutic interventions  Participation Level:   Active  Participation Quality:  Appropriate      Behavioral  Observation:  Well Groomed, Alert, and Appropriate.   Current Psychosocial Factors: The patient reports anxiety restricts social interaction a great deal.  Content of Session:   Review current symptoms and work on therapeutic interventions for issues related to recurrent depression as well as anxiety.  Current Status:   The patient reports that she continues to report depression but has been working on coping skills and reports that depression has been improving.  Patient Progress:   Stable with improvement initially from psychotropic interventions.  Target Goals:   Target goals include reducing the intensity, severity, and duration of symptoms of depression including feelings of hopelessness and helplessness, social isolation, and withdrawal from others. The patient reports that she would also like to work specifically on reducing the frequency and intensity of her anxiety and panic-like symptoms  Last Reviewed:   10/18/2013  Goals Addressed Today:    Today we worked on Producer, television/film/videobuilding coping skills and strategies utilizing the cognitive approach around her interpretation of her situation in her life.  Impression/Diagnosis:   The patient has a long history of recurrent symptoms of depression without psychotic features. She also describes significant symptoms of anxiety and possible panic attack.  Diagnosis:    Axis I: Major depressive disorder, recurrent episode, moderate  Generalized anxiety disorder   RODENBOUGH,JOHN R, PsyD

## 2013-11-05 ENCOUNTER — Encounter (HOSPITAL_COMMUNITY): Payer: Self-pay | Admitting: Psychology

## 2013-11-06 ENCOUNTER — Encounter (HOSPITAL_COMMUNITY): Payer: Self-pay | Admitting: Psychology

## 2013-11-06 ENCOUNTER — Other Ambulatory Visit: Payer: Self-pay | Admitting: *Deleted

## 2013-11-06 ENCOUNTER — Ambulatory Visit (INDEPENDENT_AMBULATORY_CARE_PROVIDER_SITE_OTHER): Payer: Self-pay | Admitting: Psychology

## 2013-11-06 DIAGNOSIS — F331 Major depressive disorder, recurrent, moderate: Secondary | ICD-10-CM

## 2013-11-06 DIAGNOSIS — F411 Generalized anxiety disorder: Secondary | ICD-10-CM

## 2013-11-06 MED ORDER — VERAPAMIL HCL 80 MG PO TABS: 80.0000 mg | ORAL_TABLET | Freq: Two times a day (BID) | ORAL | Status: DC

## 2013-11-06 MED ORDER — HYDROCHLOROTHIAZIDE 25 MG PO TABS: 25.0000 mg | ORAL_TABLET | Freq: Every day | ORAL | Status: DC

## 2013-11-06 MED ORDER — LISINOPRIL 20 MG PO TABS: 20.0000 mg | ORAL_TABLET | Freq: Every day | ORAL | Status: DC

## 2013-11-06 NOTE — Telephone Encounter (Signed)
Pt need lisinopril, verapamil and fluid pill needs called in to [facility].

## 2013-11-06 NOTE — Telephone Encounter (Signed)
Refills complete 

## 2013-11-06 NOTE — Progress Notes (Signed)
PROGRESS NOTE  Patient:  Beth Sanford   DOB: 01/20/1970  MR Number: [id number]  [facility]- [address] [place] [id number] Dept: [phone]  Start: 1 PM End: 2 PM  Provider/Observer:     Hershal CoriaJohn R Seraphine Gudiel PSYD  Chief Complaint:      Chief Complaint  Patient presents with  . Depression  . Anxiety    Reason For Service:     The patient was referred by Dr. Tenny Crawoss for psychotherapeutic interventions. The patient reports that she has had significant troubles with depression for many years. She reports that she had been seen Dr. Tiburcio PeaHarris for psychiatric services as well as a counselor and like them both by Dr. Tibur[facility]Harris [place] [facility]. She then tried a new psychiatrist and did not particularly related to them very well and stopped seeing him. She is now return in and started seeing Dr. Tenny Crawoss. The patient reports that she's been without her medications for depression and anxiety for some time. The patient reports that she has started back on her psychotropic medications and has been feeling better. However, she reports that she continues to be "stressed out" and has recurrent panic attacks. The patient also significant medical issues including congestive heart disease and COPD. The patient reports that while she has been feeling better she has continued to have a lot of anxiety and depression. Reports that she is in the process of quitting smoking cigarettes and has only been using E. cigarette/vapor and is also trying to stop that as well. The patient reports that there continues to be a lot of stress between her and her long-time boyfriend and she reports that he has not been time for related to work very well at all.  Interventions Strategy:  Cognitive/behavioral psychotherapeutic interventions  Participation Level:   Active  Participation Quality:  Appropriate      Behavioral  Observation:  Well Groomed, Alert, and Appropriate.   Current Psychosocial Factors: The patient reports she has gotten rid of boyfriend and while it was stressful she has stuck to it.  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    Content of Session:   Review current symptoms and work on therapeutic interventions for issues related to recurrent depression as well as anxiety.  Current Status:   The patient reports that she continues to report depression and fatigued and tired but depression is improving and she reports improvement.  The patient thinks she has low thyroid but they have waited to do further testing until ribs heal.   Patient Progress:   Stable with improvement initially from psychotropic interventions.  Target Goals:   Target goals include reducing the intensity, severity, and duration of symptoms of depression including feelings of hopelessness and helplessness, social isolation, and withdrawal from others. The patient reports that she would also like to work specifically on reducing the frequency and intensity of her anxiety and panic-like symptoms  Last Reviewed:   11/06/2013  Goals Addressed Today:    Today we worked on Producer, television/film/videobuilding coping skills and strategies utilizing the cognitive approach around her interpretation of her situation in her life.  Impression/Diagnosis:   The patient has a long history of recurrent symptoms of depression without psychotic features. She also describes significant symptoms of anxiety and possible panic attack.  Diagnosis:    Axis I: Major depressive disorder, recurrent episode, moderate  Generalized anxiety disorder  Hershal CoriaODENBOUGH,Alandis Bluemel R, PsyD

## 2013-11-07 ENCOUNTER — Ambulatory Visit (INDEPENDENT_AMBULATORY_CARE_PROVIDER_SITE_OTHER): Payer: Self-pay | Admitting: Psychiatry

## 2013-11-07 ENCOUNTER — Encounter (HOSPITAL_COMMUNITY): Payer: Self-pay | Admitting: Psychiatry

## 2013-11-07 VITALS — BP 162/104 | HR 81 | Ht 62.0 in | Wt 161.2 lb

## 2013-11-07 DIAGNOSIS — F331 Major depressive disorder, recurrent, moderate: Secondary | ICD-10-CM

## 2013-11-07 DIAGNOSIS — F411 Generalized anxiety disorder: Secondary | ICD-10-CM

## 2013-11-07 DIAGNOSIS — F332 Major depressive disorder, recurrent severe without psychotic features: Secondary | ICD-10-CM

## 2013-11-07 MED ORDER — ALPRAZOLAM 1 MG PO TABS: 1.0000 mg | ORAL_TABLET | Freq: Four times a day (QID) | ORAL | Status: DC

## 2013-11-07 MED ORDER — CITALOPRAM HYDROBROMIDE 20 MG PO TABS: 20.0000 mg | ORAL_TABLET | Freq: Every day | ORAL | Status: DC

## 2013-11-07 MED ORDER — MIRTAZAPINE 30 MG PO TABS: 30.0000 mg | ORAL_TABLET | Freq: Every day | ORAL | Status: DC
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
## 2013-12-06 ENCOUNTER — Ambulatory Visit (HOSPITAL_COMMUNITY): Payer: Self-pay | Admitting: Psychology

## 2013-12-12 ENCOUNTER — Ambulatory Visit (INDEPENDENT_AMBULATORY_CARE_PROVIDER_SITE_OTHER): Payer: Self-pay | Admitting: Cardiovascular Disease

## 2013-12-12 ENCOUNTER — Encounter: Payer: Self-pay | Admitting: Cardiovascular Disease

## 2013-12-12 VITALS — BP 186/118 | HR 99 | Ht 62.0 in | Wt 168.0 lb

## 2013-12-12 DIAGNOSIS — R079 Chest pain, unspecified: Secondary | ICD-10-CM

## 2013-12-12 DIAGNOSIS — E782 Mixed hyperlipidemia: Secondary | ICD-10-CM

## 2013-12-12 DIAGNOSIS — R61 Generalized hyperhidrosis: Secondary | ICD-10-CM

## 2013-12-12 DIAGNOSIS — I10 Essential (primary) hypertension: Secondary | ICD-10-CM

## 2013-12-12 DIAGNOSIS — Z720 Tobacco use: Secondary | ICD-10-CM

## 2013-12-12 DIAGNOSIS — I119 Hypertensive heart disease without heart failure: Secondary | ICD-10-CM

## 2013-12-12 MED ORDER — ISOSORBIDE MONONITRATE ER 30 MG PO TB24: 30.0000 mg | ORAL_TABLET | Freq: Every day | ORAL | Status: DC

## 2013-12-12 MED ORDER — LISINOPRIL 20 MG PO TABS: ORAL_TABLET | ORAL | Status: DC

## 2013-12-12 NOTE — Progress Notes (Addendum)
Patient ID: Beth Sanford, female   DOB: 12/20/1970, 43 y.o.   MRN: [id number]      SUBJECTIVE: The patient presents for follow up of hypertensive heart disease and chest pain. BP today 200/120 mmHg in left arm. She has chest pain twice per week, lasting roughly 15 minutes. If she rubs on her chest and puts her feet up, the pain eventually resolves. She has also been breaking out in sweats and constantly feels hot. She is still having menstrual cycles. Both her mother and brother have hypothyroidism. The chest pain is retrosternal and radiates down the left arm. ECG in 09/2013 demonstrated normal sinus rhythm without ischemic abnormalities.   Review of Systems: As per "subjective", otherwise negative.  Allergies  Allergen Reactions  . Beth Sanford Itching and Nausea Only  . Beth Sanford Nausea Only    Current Outpatient Prescriptions  Medication Sig Dispense Refill  . Beth Sanford (XANAX) 1 MG tablet Take 1 tablet (1 mg total) by mouth 4 (four) times daily. 120 tablet 2  . Ca Carbonate-Mag Hydroxide (ROLAIDS PO) Take 2 tablets by mouth daily as needed (heartburn).    . Beth Sanford (CELEXA) 20 MG tablet Take 1 tablet (20 mg total) by mouth daily. 30 tablet 2  . Beth Sanford (HYDRODIURIL) 25 MG tablet Take 1 tablet (25 mg total) by mouth daily. 30 tablet 3  . Beth Sanford (PRINIVIL,ZESTRIL) 20 MG tablet Take 1 tablet (20 mg total) by mouth daily. 30 tablet 3  . Beth Sanford (REMERON) 30 MG tablet Take 1 tablet (30 mg total) by mouth at bedtime. 30 tablet 2  . Beth Sanford (FISH OIL PO) Take by mouth daily.    . Beth Sanford (ZOCOR) 20 MG tablet Take 1 tablet (20 mg total) by mouth at bedtime. 90 tablet 3  . Beth Sanford (CALAN) 80 MG tablet Take 1 tablet (80 mg total) by mouth 2 (two) times daily. 60 tablet 3   No current facility-administered medications for this visit.    Past Medical History  Diagnosis Date  . Hypertension   . High cholesterol   . Anxiety   . Depression     Past  Surgical History  Procedure Laterality Date  . Cholecystectomy    . Elbow surgery  left    History   Social History  . Marital Status: Single    Spouse Name: N/A    Number of Children: N/A  . Years of Education: N/A   Occupational History  . Not on file.   Social History Main Topics  . Smoking status: Former Smoker -- 0.50 packs/day for 35 years    Start date: 01/05/1984    Quit date: 01/05/2012  . Smokeless tobacco: Never Used  . Alcohol Use: No  . Drug Use: No  . Sexual Activity: Yes    Birth Control/ Protection: None   Other Topics Concern  . Not on file   Social History Narrative     Filed Vitals:   12/12/13 1332  BP: 186/118  Pulse: 99  Height: 5\' 2"  (1.575 m)  Weight: 168 lb (76.204 kg)    PHYSICAL EXAM General: NAD HEENT: Normal. Neck: No JVD, no thyromegaly. Lungs: Clear to auscultation bilaterally with normal respiratory effort. CV: Nondisplaced PMI.  Regular rate and rhythm, normal S1/S2, no S3/S4, 1/6 systolic murmur along left sternal border. No pretibial or periankle edema.  No carotid bruit.  Normal pedal pulses.  Abdomen: Soft, nontender, obese, no distention.  Neurologic: Alert and oriented x 3.  Psych: Normal affect. Skin: Normal. Musculoskeletal:  Normal range of motion, no gross deformities. Extremities: No clubbing or cyanosis.   ECG: Most recent ECG reviewed.      ASSESSMENT AND PLAN: 1. Chest pain: Deemed secondary to anxiety and musculoskeletal chest wall pain in the past, due to rib fractures. No ischemia with stress testing in the past. May be due to severe hypertensive heart disease. I will increase Beth Sanford to 20 mg bid and start Beth Sanford 30 mg. 2. Malignant HTN: Markedly elevated. Will increase Beth Sanford to 20 mg bid. Continue Beth Sanford and HCTZ. Adding Beth Sanford. I have asked the patient to check blood pressure readings 3-4 times per week, at different times throughout the day, in order to get a better approximation of mean BP  values. These results will be provided to me at the end of that period so that I can determine if antihypertensive medication titration is indicated. 3. Hyperlipidemia: On 04/06/13, TC 173, TG 194, HDL 44, LDL 90. Continue Beth Sanford 20 mg. 4. Tobacco abuse: Cessation counseling previously provided. 5. Diaphoresis: Will check TSH and free T4 given strong family h/o hypothyroidism.  Dispo: f/u 3 weeks.   Prentice DockerSuresh [name], M.D., F.A.C.C.

## 2013-12-12 NOTE — Patient Instructions (Signed)
Your physician recommends that you schedule a follow-up appointment in: 3 weeks with Dr. Purvis SheffieldKoneswaran  Your physician has recommended you make the following change in your medication:   START IMDUR 30 MG DAILY - IF YOU EXPERIENCE HEADACHE, YOU MAY TAKE TYLENOL 30 MINS BEFORE TAKING IMDUR  INCREASE LISINOPRIL 20 MG TWICE DAILY  Your physician recommends that you return for lab work TSH/T3/T4  [facility] you for choosing [facility]!!

## 2013-12-13 ENCOUNTER — Telehealth: Payer: Self-pay | Admitting: *Deleted

## 2013-12-13 LAB — T4, FREE: Free T4: 0.83 ng/dL (ref 0.80–1.80)

## 2013-12-13 LAB — T3, FREE: T3 FREE: 2.8 pg/mL (ref 2.3–4.2)

## 2013-12-13 LAB — TSH: TSH: 1.061 u[IU]/mL (ref 0.350–4.500)

## 2013-12-13 NOTE — Telephone Encounter (Signed)
Pt aware, no pcp 

## 2013-12-13 NOTE — Telephone Encounter (Signed)
-----   Message from Laqueta LindenSuresh A Koneswaran, MD sent at 12/13/2013  9:04 AM EST ----- Please inform pt that thyroid studies were normal.

## 2014-01-08 ENCOUNTER — Ambulatory Visit (INDEPENDENT_AMBULATORY_CARE_PROVIDER_SITE_OTHER): Payer: Self-pay | Admitting: Psychiatry

## 2014-01-08 ENCOUNTER — Encounter (HOSPITAL_COMMUNITY): Payer: Self-pay | Admitting: Psychiatry

## 2014-01-08 VITALS — BP 140/110 | Ht 62.0 in | Wt 169.0 lb

## 2014-01-08 DIAGNOSIS — F411 Generalized anxiety disorder: Secondary | ICD-10-CM

## 2014-01-08 DIAGNOSIS — F329 Major depressive disorder, single episode, unspecified: Secondary | ICD-10-CM

## 2014-01-08 DIAGNOSIS — F331 Major depressive disorder, recurrent, moderate: Secondary | ICD-10-CM

## 2014-01-08 MED ORDER — CITALOPRAM HYDROBROMIDE 20 MG PO TABS: 20.0000 mg | ORAL_TABLET | Freq: Every day | ORAL | Status: DC

## 2014-01-08 MED ORDER — MIRTAZAPINE 30 MG PO TABS: 30.0000 mg | ORAL_TABLET | Freq: Every day | ORAL | Status: DC

## 2014-01-08 MED ORDER — ALPRAZOLAM 1 MG PO TABS: 1.0000 mg | ORAL_TABLET | Freq: Four times a day (QID) | ORAL | Status: DC
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
## 2014-01-09 ENCOUNTER — Ambulatory Visit (INDEPENDENT_AMBULATORY_CARE_PROVIDER_SITE_OTHER): Payer: Self-pay | Admitting: Psychology

## 2014-01-09 ENCOUNTER — Encounter (HOSPITAL_COMMUNITY): Payer: Self-pay | Admitting: Psychology

## 2014-01-09 DIAGNOSIS — F411 Generalized anxiety disorder: Secondary | ICD-10-CM

## 2014-01-09 DIAGNOSIS — F331 Major depressive disorder, recurrent, moderate: Secondary | ICD-10-CM

## 2014-01-09 NOTE — Progress Notes (Signed)
PROGRESS NOTE  Patient:  Beth MinerSheree C Branagan   DOB: 04/07/1970  MR Number: [id number]  [facility] [address] [place] [address] Dept: [phone]  Start: 4 PM End: 5 PM  Provider/Observer:     Hershal CoriaJohn R Rodenbough PSYD  Chief Complaint:      Chief Complaint  Patient presents with  . Anxiety  . Depression    Reason For Service:     The patient was referred by Dr. Tenny Crawoss for psychotherapeutic interventions. The patient reports that she has had significant troubles with depression for many years. She reports that she had been seen Dr. Tiburcio PeaHarris for psychiatric services as well as a counselor and like them both by Dr[place] [place] She then tried a new psychiatrist and did not particularly related to them very well and stopped seeing him. She is now return in and started seeing Dr. Tenny Crawoss. The patient reports that she's been without her medications for depression and anxiety for some time. The patient reports that she has started back on her psychotropic medications and has been feeling better. However, she reports that she continues to be "stressed out" and has recurrent panic attacks. The patient also significant medical issues including congestive heart disease and COPD. The patient reports that while she has been feeling better she has continued to have a lot of anxiety and depression. Reports that she is in the process of quitting smoking cigarettes and has only been using E. cigarette/vapor and is also trying to stop that as well. The patient reports that there continues to be a lot of stress between her and her long-time boyfriend and she reports that he has not been time for related to work very well at all.  Interventions Strategy:  Cognitive/behavioral psychotherapeutic interventions  Participation Level:   Active  Participation Quality:  Appropriate       Behavioral Observation:  Well Groomed, Alert, and Appropriate.   Current Psychosocial Factors: The patient reports she has gotten rid of boyfriend and while it was stressful she has stuck to it.  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    Content of Session:   Review current symptoms and work on therapeutic interventions for issues related to recurrent depression as well as anxiety.  Current Status:   The patient reports that she continues to report depression and fatigued and tired but depression is improving and she reports improvement.  The patient thinks she has low thyroid but they have waited to do further testing until ribs heal.   Patient Progress:   Stable with improvement initially from psychotropic interventions.  Target Goals:   Target goals include reducing the intensity, severity, and duration of symptoms of depression including feelings of hopelessness and helplessness, social isolation, and withdrawal from others. The patient reports that she would also like to work specifically on reducing the frequency and intensity of her anxiety and panic-like symptoms  Last Reviewed:   01/09/2014  Goals Addressed Today:    Today we worked on Producer, television/film/videobuilding coping skills and strategies utilizing the cognitive approach around her interpretation of her situation in her life.  Impression/Diagnosis:   The patient has a long history of recurrent symptoms of depression without psychotic features. She also describes significant symptoms of anxiety and possible panic attack.  Diagnosis:    Axis I: Major depressive disorder, recurrent episode, moderate  Generalized anxiety disorder  Hershal CoriaODENBOUGH,JOHN R, PsyD

## 2014-01-10 ENCOUNTER — Ambulatory Visit: Payer: Self-pay | Admitting: Cardiovascular Disease

## 2014-02-01 ENCOUNTER — Ambulatory Visit (INDEPENDENT_AMBULATORY_CARE_PROVIDER_SITE_OTHER): Payer: Self-pay | Admitting: Adult Health

## 2014-02-01 ENCOUNTER — Encounter: Payer: Self-pay | Admitting: Adult Health

## 2014-02-01 VITALS — BP 145/102 | HR 88 | Ht 62.5 in | Wt 169.0 lb

## 2014-02-01 DIAGNOSIS — E785 Hyperlipidemia, unspecified: Secondary | ICD-10-CM

## 2014-02-01 DIAGNOSIS — G4710 Hypersomnia, unspecified: Secondary | ICD-10-CM

## 2014-02-01 DIAGNOSIS — R079 Chest pain, unspecified: Secondary | ICD-10-CM

## 2014-02-01 DIAGNOSIS — I10 Essential (primary) hypertension: Secondary | ICD-10-CM

## 2014-02-01 DIAGNOSIS — R400 Somnolence: Secondary | ICD-10-CM

## 2014-02-01 MED ORDER — VERAPAMIL HCL ER 180 MG PO TBCR: 180.0000 mg | EXTENDED_RELEASE_TABLET | Freq: Every day | ORAL | Status: DC

## 2014-02-01 NOTE — Progress Notes (Deleted)
Name: Beth Sanford    DOB: [birth year]  Age: 44 y.o.  MR#: [id number]       PCP:  No PCP Per Patient      Insurance: Payor: MED PAY / Plan: MED PAY ASSURANCE / Product Type: *No Product type* /   CC:    Chief Complaint  Patient presents with  . Hypertension  . Chest Pain    VS Filed Vitals:   02/01/14 1618  BP: 145/102  Pulse: 88  Height: 5' 2.5" (1.588 m)  Weight: 169 lb (76.658 kg)  SpO2: 95%    Weights Current Weight  02/01/14 169 lb (76.658 kg)  01/08/14 169 lb (76.658 kg)  12/12/13 168 lb (76.204 kg)    Blood Pressure  BP Readings from Last 3 Encounters:  02/01/14 145/102  01/08/14 140/110  12/12/13 186/118     Admit date:  (Not on file) Last encounter with RMR:  09/11/2013   Allergy Codeine and Tramadol  Current Outpatient Prescriptions  Medication Sig Dispense Refill  . ALPRAZolam (XANAX) 1 MG tablet Take 1 tablet (1 mg total) by mouth 4 (four) times daily. 120 tablet 2  . Ca Carbonate-Mag Hydroxide (ROLAIDS PO) Take 2 tablets by mouth daily as needed (heartburn).    . citalopram (CELEXA) 20 MG tablet Take 1 tablet (20 mg total) by mouth daily. 30 tablet 2  . hydrochlorothiazide (HYDRODIURIL) 25 MG tablet Take 1 tablet (25 mg total) by mouth daily. 30 tablet 3  . isosorbide mononitrate (IMDUR) 30 MG 24 hr tablet Take 1 tablet (30 mg total) by mouth daily. 90 tablet 3  . lisinopril (PRINIVIL,ZESTRIL) 20 MG tablet Take 1 tab 2 times daily 30 tablet 3  . mirtazapine (REMERON) 30 MG tablet Take 1 tablet (30 mg total) by mouth at bedtime. 30 tablet 2  . Omega-3 Fatty Acids (FISH OIL PO) Take by mouth daily.    . simvastatin (ZOCOR) 20 MG tablet Take 1 tablet (20 mg total) by mouth at bedtime. 90 tablet 3  . verapamil (CALAN) 80 MG tablet Take 1 tablet (80 mg total) by mouth 2 (two) times daily. 60 tablet 3   No current facility-administered medications for this visit.    Discontinued Meds:   There are no discontinued medications.  Patient Active Problem List   Diagnosis Date Noted  . Major depression 06/08/2013  . Chest pain 11/01/2012  . HTN (hypertension) 11/01/2012  . Hyperlipidemia 11/01/2012    LABS    Component Value Date/Time   NA 133* 10/19/2012 1423   K 3.8 10/19/2012 1423   CL 100 10/19/2012 1423   CO2 19 10/19/2012 1423   GLUCOSE 122* 10/19/2012 1423   BUN 5* 10/19/2012 1423   CREATININE 0.60 10/19/2012 1423   CALCIUM 8.8 10/19/2012 1423   GFRNONAA >90 10/19/2012 1423   GFRAA >90 10/19/2012 1423   CMP     Component Value Date/Time   NA 133* 10/19/2012 1423   K 3.8 10/19/2012 1423   CL 100 10/19/2012 1423   CO2 19 10/19/2012 1423   GLUCOSE 122* 10/19/2012 1423   BUN 5* 10/19/2012 1423   CREATININE 0.60 10/19/2012 1423   CALCIUM 8.8 10/19/2012 1423   GFRNONAA >90 10/19/2012 1423   GFRAA >90 10/19/2012 1423       Component Value Date/Time   WBC 11.4* 10/19/2012 1423   WBC 12.7* 05/12/2012 1435   HGB 15.4* 10/19/2012 1423   HGB 15.8* 05/12/2012 1435   HCT 44.4 10/19/2012 1423   HCT 46.0  05/12/2012 1435   MCV 95.3 10/19/2012 1423   MCV 94.5 05/12/2012 1435    Lipid Panel     Component Value Date/Time   CHOL 173 04/06/2013 1333   TRIG 194* 04/06/2013 1333   HDL 44 04/06/2013 1333   CHOLHDL 3.9 04/06/2013 1333   VLDL 39 04/06/2013 1333   LDLCALC 90 04/06/2013 1333    ABG No results found for: PHART, PCO2ART, PO2ART, HCO3, TCO2, ACIDBASEDEF, O2SAT   Lab Results  Component Value Date   TSH 1.061 12/12/2013   BNP (last 3 results) No results for input(s): PROBNP in the last 8760 hours. Cardiac Panel (last 3 results) No results for input(s): CKTOTAL, CKMB, TROPONINI, RELINDX in the last 72 hours.  Iron/TIBC/Ferritin/ %Sat No results found for: IRON, TIBC, FERRITIN, IRONPCTSAT   EKG Orders placed or performed in visit on 09/11/13  . EKG 12-Lead     Prior Assessment and Plan Problem List as of 02/01/2014      Cardiovascular and Mediastinum   HTN (hypertension)   Last Assessment & Plan 09/11/2013  Office Visit Written 09/11/2013  3:17 PM by Jodelle GrossKathryn M Lawrence, NP    It is difficult to ascertain if BP elevation is now related to pain from rib fx or need to increase antihypertensive medications. She has multiple somatic complaints with anxiety also playing a role. She will have her BP checked to calibrate her BP machine with ours. NO changes in her BP medications at this time until she is healed from the rib fx as she is not dangerously elevated. Still want to consider sleep study sometime in the future.         Other   Chest pain   Last Assessment & Plan 08/28/2013 Office Visit Written 08/28/2013  3:12 PM by Jodelle GrossKathryn M Lawrence, NP    Denies chest pain currently.       Hyperlipidemia   Last Assessment & Plan 09/11/2013 Office Visit Written 09/11/2013  3:17 PM by Jodelle GrossKathryn M Lawrence, NP    Labs are being monitored by her PCP>      Major depression   Last Assessment & Plan 08/28/2013 Office Visit Written 08/28/2013  3:13 PM by Jodelle GrossKathryn M Lawrence, NP    Followed by Behavioral Health          Imaging: No results found.

## 2014-02-01 NOTE — Assessment & Plan Note (Signed)
Continue risk management.  Simvastatin, and fish oil.  Followup labs in 6 months.

## 2014-02-01 NOTE — Assessment & Plan Note (Signed)
Her blood pressure remains difficult to control.  She does not tolerate the nitrates [place] do to recurrent headaches.  She is not taking and nitrates as directed as a result of this.  I advised her to take the medication with Tylenol, and follow with Tylenol every 6 hours until her body becomes use of this medication.  I have reassured her that the headache should get better.  In the interim, I will check a sleep study, to evaluate for sleep apnea contributing to difficult to control high blood pressure.  We will also change her verapamil from 80 mg twice a day to Calan XR 180  mg daily.would continue the calcium channel blocker, as this will help with angina symptoms.  We will see her in the office on followup to discuss sleep study results and to check her blood pressure.  If she is unable to tolerate the nitrates, demand to take her off of it and continue to adjust the calcium channel blocker.

## 2014-02-01 NOTE — Patient Instructions (Signed)
Your physician recommends that you schedule a follow-up appointment in: 3 weeks    Your physician has recommended that you have a sleep study. This test records several body functions during sleep, including: brain activity, eye movement, oxygen and carbon dioxide blood levels, heart rate and rhythm, breathing rate and rhythm, the flow of air through your mouth and nose, snoring, body muscle movements, and chest and belly movement.     STOP Verapamil    START Calan SR 180 mg daily      Thank you for choosing [facility] !

## 2014-02-01 NOTE — Assessment & Plan Note (Signed)
Uncertain etiology, as she has been ruled out for CAD.  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  Some of this may be related to uncontrolled hypertension, versus high lung pressures.  Sleep study is ordered.  She will continue the nitrates as discussed above.

## 2014-02-01 NOTE — Progress Notes (Signed)
HPI: Beth Sanford is a 44 year old female patient of Dr. Purvis SheffieldKoneswaran, that we follow for hypertensive heart disease, and atypical chest pain.  The patient also has significant anxiety and depression.  On last office visit.  She was started on Xanax, but has been followed up by her psychiatrist, with increased dose.  On last office visit.  Patient's blood pressure was 200/120 in the left arm, lisinopril was increased to 20 mg twice a day, and indoor was started at 30 mg.  According to notes from psychiatry, the patient had a significant headache, associated with that. In the office on 01/08/2014, with her counselor, the patient's blood pressure was 140/110. The patient was also counseled on smoking cessation. She is here on followup for ongoing assessment of her blood pressure.    She comes today complaining of a headache after taking Imdur.  She does not take it every day as directed.she felt some pressure and pain in her chest and took one this morning which illicited headache.  She states that she continues to have recurrent chest pain, but she feels her blood pressures getting better controlled.  Allergies  Allergen Reactions  . Codeine Itching and Nausea Only  . Tramadol Nausea Only    Current Outpatient Prescriptions  Medication Sig Dispense Refill  . ALPRAZolam (XANAX) 1 MG tablet Take 1 tablet (1 mg total) by mouth 4 (four) times daily. 120 tablet 2  . Ca Carbonate-Mag Hydroxide (ROLAIDS PO) Take 2 tablets by mouth daily as needed (heartburn).    . citalopram (CELEXA) 20 MG tablet Take 1 tablet (20 mg total) by mouth daily. 30 tablet 2  . hydrochlorothiazide (HYDRODIURIL) 25 MG tablet Take 1 tablet (25 mg total) by mouth daily. 30 tablet 3  . isosorbide mononitrate (IMDUR) 30 MG 24 hr tablet Take 1 tablet (30 mg total) by mouth daily. 90 tablet 3  . lisinopril (PRINIVIL,ZESTRIL) 20 MG tablet Take 1 tab 2 times daily 30 tablet 3  . mirtazapine (REMERON) 30 MG tablet Take 1 tablet (30 mg  total) by mouth at bedtime. 30 tablet 2  . Omega-3 Fatty Acids (FISH OIL PO) Take by mouth daily.    . simvastatin (ZOCOR) 20 MG tablet Take 1 tablet (20 mg total) by mouth at bedtime. 90 tablet 3  . verapamil (CALAN SR) 180 MG CR tablet Take 1 tablet (180 mg total) by mouth at bedtime. 90 tablet 3   No current facility-administered medications for this visit.    Past Medical History  Diagnosis Date  . Hypertension   . High cholesterol   . Anxiety   . Depression     Past Surgical History  Procedure Laterality Date  . Cholecystectomy    . Elbow surgery  left    ROS: Complete review of systems performed and found to be negative unless outlined above  PHYSICAL EXAM BP 145/102 mmHg  Pulse 88  Ht 5' 2.5" (1.588 m)  Wt 169 lb (76.658 kg)  BMI 30.40 kg/m2  SpO2 95%  LMP 01/22/2014 General: Well developed, well nourished, in no acute distress Head: Eyes PERRLA, No xanthomas.   Normal cephalic and atramatic  Lungs: Clear bilaterally to auscultation and percussion. Heart: HRRR S1 S2, soft S4 without MRG.  Pulses are 2+ & equal.            No carotid bruit. No JVD.  No abdominal bruits. No femoral bruits. Abdomen: Bowel sounds are positive, abdomen soft and non-tender without masses or  Hernia's noted. Msk:  Back normal, normal gait. Normal strength and tone for age. Extremities: No clubbing, cyanosis or edema.  DP +1 Neuro: Alert and oriented X 3. Psych:  Good affect, responds appropriately  ASSESSMENT AND PLAN

## 2014-02-05 ENCOUNTER — Ambulatory Visit (INDEPENDENT_AMBULATORY_CARE_PROVIDER_SITE_OTHER): Payer: Self-pay | Admitting: Psychology

## 2014-02-05 ENCOUNTER — Encounter (HOSPITAL_COMMUNITY): Payer: Self-pay | Admitting: Psychology

## 2014-02-05 DIAGNOSIS — F331 Major depressive disorder, recurrent, moderate: Secondary | ICD-10-CM

## 2014-02-05 DIAGNOSIS — F411 Generalized anxiety disorder: Secondary | ICD-10-CM

## 2014-02-05 NOTE — Progress Notes (Signed)
     PROGRESS NOTE  Patient:  Beth Sanford   DOB: 03/18/1970  MR Number: [id number]  Location: [facility] [address] [place] [address] Dept: [phone]  Start: 3 PM End: 4 PM  Provider/Observer:     Hershal CoriaJohn R Rodenbough PSYD  Chief Complaint:      Chief Complaint  Patient presents with  . Anxiety  . Depression    Reason For Service:     The patient was referred by Dr. Tenny Crawoss for psychotherapeutic interventions. The patient reports that she has had significant troubles with depression for many years. She reports that she had been seen Dr. Tiburcio PeaHarris for psychiatric services as well as a counselor and like them both by Dr. Tibur[facility]io [facility]Harris [facility]. She then tried a new psychiatrist and did not particularly related to them very well and stopped seeing him. She is now return in and started seeing Dr. Tenny Crawoss. The patient reports that she's been without her medications for depression and anxiety for some time. The patient reports that she has started back on her psychotropic medications and has been feeling better. However, she reports that she continues to be "stressed out" and has recurrent panic attacks. The patient also significant medical issues including congestive heart disease and COPD. The patient reports that while she has been feeling better she has continued to have a lot of anxiety and depression. Reports that she is in the process of quitting smoking cigarettes and has only been using E. cigarette/vapor and is also trying to stop that as well. The patient reports that there continues to be a lot of stress between her and her long-time boyfriend and she reports that he has not been time for related to work very well at all.  Interventions Strategy:  Cognitive/behavioral psychotherapeutic interventions  Participation Level:   Active  Participation Quality:  Appropriate       Behavioral Observation:  Well Groomed, Alert, and Appropriate.   Current Psychosocial Factors: The patient reports she had unexpected costs to her rent that is really stressed about this and does not know how to cope with this.  BP is really getting higher and she can not afford all of her medications.    Content of Session:   Review current symptoms and work on therapeutic interventions for issues related to recurrent depression as well as anxiety.  Current Status:   The patient reports that she continues to experience depression and fatigued and is having a lot conflects with the person over seeing her apartment.    Patient Progress:   Stable with improvement initially from psychotropic interventions.  Target Goals:   Target goals include reducing the intensity, severity, and duration of symptoms of depression including feelings of hopelessness and helplessness, social isolation, and withdrawal from others. The patient reports that she would also like to work specifically on reducing the frequency and intensity of her anxiety and panic-like symptoms  Last Reviewed:   02/05/2014  Goals Addressed Today:    Today we worked on Producer, television/film/videobuilding coping skills and strategies utilizing the cognitive approach around her interpretation of her situation in her life.  Impression/Diagnosis:   The patient has a long history of recurrent symptoms of depression without psychotic features. She also describes significant symptoms of anxiety and possible panic attack.  Diagnosis:    Axis I: Major depressive disorder, recurrent episode, moderate  Generalized anxiety disorder   RODENBOUGH,JOHN R, PsyD

## 2014-02-07 ENCOUNTER — Other Ambulatory Visit: Payer: Self-pay

## 2014-02-07 DIAGNOSIS — R400 Somnolence: Secondary | ICD-10-CM

## 2014-02-22 ENCOUNTER — Encounter: Payer: Self-pay | Admitting: Adult Health

## 2014-02-22 NOTE — Progress Notes (Signed)
Integrated Behavioral Health via Telemedicine Visit  02/02/2022 Beth Sanford [id number]  Number of Integrated Behavioral Health Clinician visits: 1- Initial Visit  Session Start time: 1416   Session End time: 1455  Total time in minutes: 39   Referring Provider: *** Patient/Family location: Home*** [facility] Provider location: [facility]  All persons participating in visit: Patient Beth Sanford and [facility] Jamie McMannes ***  Types of Service: {CHL AMB TYPE OF SERVICE:2103500047}  I connected with Beth Sanford and/or Beth Sanford's {family members:20773} via  Telephone or Video Enabled Telemedicine Application  (Video is Caregility application) and verified that I am speaking with the correct person using two identifiers. Discussed confidentiality: Yes   I discussed the limitations of telemedicine and the availability of in person appointments.  Discussed there is a possibility of technology failure and discussed alternative modes of communication if that failure occurs.  I discussed that engaging in this telemedicine visit, they consent to the provision of behavioral healthcare and the services will be billed under their insurance.  Patient and/or legal guardian expressed understanding and consented to Telemedicine visit: Yes   Presenting Concerns: Patient and/or family reports the following symptoms/concerns: *** Duration of problem: ***; Severity of problem: {Mild/Moderate/Severe:20260}  Patient and/or Family's Strengths/Protective Factors: {CHL AMB BH PROTECTIVE FACTORS:2103500051}  Goals Addressed: Patient will:  Reduce symptoms of: {IBH Symptoms:21014056}   Increase knowledge and/or ability of: {IBH Patient Tools:21014057}   Demonstrate ability to: {IBH Goals:21014053}  Progress towards Goals: {CHL AMB BH PROGRESS TOWARDS GOALS:2103500056}  Interventions: Interventions utilized:  {IBH  Interventions:21014054} Standardized Assessments completed: {IBH Screening Tools:21014051}  Patient and/or Family Response: Patient agrees with treatment plan. ***  Assessment: Patient currently experiencing ***.   Patient may benefit from continued therapeutic interventions***.  Plan: Follow up with behavioral health clinician on : *** Behavioral recommendations:  -*** -*** Referral(s): {IBH Referrals:21014055}  I discussed the assessment and treatment plan with the patient and/or parent/guardian. They were provided an opportunity to ask questions and all were answered. They agreed with the plan and demonstrated an understanding of the instructions.   They were advised to Dorsch back or seek an in-person evaluation if the symptoms worsen or if the condition fails to improve as anticipated.  Jamie C McMannes, LCSW     02/15/2022    1:38 PM 05/21/2021    9:34 AM 02/16/2021   10:20 AM 11/06/2020    4:56 PM  Depression screen PHQ 2/9  Decreased Interest 3 3 1 3  Down, Depressed, Hopeless 3 3 1 3  PHQ - 2 Score 6 6 2 6  Altered sleeping 3 3 2 3  Tired, decreased energy 3 3 1 3  Change in appetite 3 3 1 3  Feeling bad or failure about yourself  1 3 0 3  Trouble concentrating 3 3 1 2  Moving slowly or fidgety/restless 3 3 0 2  Suicidal thoughts 1 3 0 0  PHQ-9 Score 23 27 7 22  Difficult doing work/chores Extremely dIfficult         02/15/2022    2:35 PM 05/21/2021    9:35 AM 02/16/2021   10:21 AM 11/06/2020    4:56 PM  GAD 7 : Generalized Anxiety Score  Nervous, Anxious, on Edge 3 3 1 2  Control/stop worrying 3 3 1 3  Worry too much - different things 3 3 1 3  Trouble relaxing 3 3 0 2  Restless 3 3 0 2  Easily annoyed   or irritable 3 3 1 3  Afraid - awful might happen 3 3 0 0  Total GAD 7 Score 21 21 4 15  Anxiety Difficulty Very difficult        

## 2014-03-01 ENCOUNTER — Ambulatory Visit (INDEPENDENT_AMBULATORY_CARE_PROVIDER_SITE_OTHER): Payer: Self-pay | Admitting: Adult Health

## 2014-03-01 ENCOUNTER — Encounter: Payer: Self-pay | Admitting: Adult Health

## 2014-03-01 VITALS — BP 132/92 | HR 82 | Ht 62.5 in | Wt 170.0 lb

## 2014-03-01 DIAGNOSIS — R0789 Other chest pain: Secondary | ICD-10-CM

## 2014-03-01 DIAGNOSIS — I10 Essential (primary) hypertension: Secondary | ICD-10-CM

## 2014-03-01 NOTE — Progress Notes (Signed)
Cardiology Office Note   Date:  03/01/2014   ID:  Beth Sanford, DOB 08/03/1970, MRN [id number]  PCP:  No PCP Per Patient  Cardiologist: Inis SizerKoneswaran/ Kathryn Lawrence, NP   Chief Complaint  Patient presents with  . Hypertension  . Cardiomyopathy  . Chest Pain      History of Present Illness: Beth Sanford is a 44 y.o. female who presents for ongoing assessment and management of hypertensive heart disease, atypical chest pain, with history of anxiety and depression. The patient was last seen in the office in January 2016, complaining of a headache after taking indoor. She unfortunately, continues to smoke. She also continues to have recurrent chest pain, but felt her blood pressure was much better controlled. On review of her office notes, blood pressure was elevated at 145/102. She did start taking and nitrates. Due to the headache. She was advised to restart this taking Tylenol every 6 hours Intal. Her body.use to the nitrate. She was advised that a sleep study to evaluate for sleep apnea contributing to difficult control, high blood pressure. We also changed to verapamil from 80 mg twice a day to [place] XR 180 mg daily. She is here for followup.  She states she is feeling much better with medication changes, is tolerating the imdur better now taking medication like Tylenol for headache.  She has not yet had a sleep study as her phone has been not been able to receive calls.  She continues to have trouble sleeping.  She continues with behavior health.    Past Medical History  Diagnosis Date  . Hypertension   . High cholesterol   . Anxiety   . Depression     Past Surgical History  Procedure Laterality Date  . Cholecystectomy    . Elbow surgery  left     Current Outpatient Prescriptions  Medication Sig Dispense Refill  . ALPRAZolam (XANAX) 1 MG tablet Take 1 tablet (1 mg total) by mouth 4 (four) times daily. 120 tablet 2  . Ca Carbonate-Mag Hydroxide (ROLAIDS PO) Take 2 tablets  by mouth daily as needed (heartburn).    . citalopram (CELEXA) 20 MG tablet Take 1 tablet (20 mg total) by mouth daily. 30 tablet 2  . hydrochlorothiazide (HYDRODIURIL) 25 MG tablet Take 1 tablet (25 mg total) by mouth daily. 30 tablet 3  . isosorbide mononitrate (IMDUR) 30 MG 24 hr tablet Take 1 tablet (30 mg total) by mouth daily. 90 tablet 3  . lisinopril (PRINIVIL,ZESTRIL) 20 MG tablet Take 1 tab 2 times daily 30 tablet 3  . mirtazapine (REMERON) 30 MG tablet Take 1 tablet (30 mg total) by mouth at bedtime. 30 tablet 2  . Omega-3 Fatty Acids (FISH OIL PO) Take by mouth daily.    . simvastatin (ZOCOR) 20 MG tablet Take 1 tablet (20 mg total) by mouth at bedtime. 90 tablet 3  . verapamil (CALAN SR) 180 MG CR tablet Take 1 tablet (180 mg total) by mouth at bedtime. 90 tablet 3   No current facility-administered medications for this visit.    Allergies:   Codeine and Tramadol    Social History:  The patient  reports that she quit smoking about 2 years ago. She started smoking about 30 years ago. She has never used smokeless tobacco. She reports that she does not drink alcohol or use illicit drugs.   Family History:  The patient's family history includes Alcohol abuse in her father; Bipolar disorder in her brother; Hypertension in her brother.  ROS: .   All other systems are reviewed and negative.Unless otherwise mentioned in  H&P above.   PHYSICAL EXAM: VS:  LMP 01/22/2014 , BMI There is no weight on file to calculate BMI. GEN: Well nourished, well developed, in no acute distress HEENT: normal Neck: no JVD, carotid bruits, or masses Cardiac:*RRR; no murmurs, rubs, or gallops,no edema  Respiratory:  Bilateral crackles in the bases.  Occasional coughing. GI: soft, nontender, nondistended, + BS MS: no deformity or atrophy Skin: warm and dry, no rash Neuro:  Strength and sensation are intact Psych: euthymic mood, full affect  Recent Labs: 12/12/2013: TSH 1.061    Lipid Panel     Component Value Date/Time   CHOL 173 04/06/2013 1333   TRIG 194* 04/06/2013 1333   HDL 44 04/06/2013 1333   CHOLHDL 3.9 04/06/2013 1333   VLDL 39 04/06/2013 1333   LDLCALC 90 04/06/2013 1333      Wt Readings from Last 3 Encounters:  02/01/14 76.658 kg (169 lb)  01/08/14 76.658 kg (169 lb)  12/12/13 76.204 kg (168 lb)     ASSESSMENT AND PLAN:  1. Hypertension: Much better controlled on isosorbide, and long-acting verapamil. She is medically compliant.  She states she is feeling better.  She does wish to proceed with sleep study.  This will be rescheduled.  2. Chest pain: Much better controlled.She is pleased with the fact that she does not have recurrence of chest pain with medication changes.  Will continue current medication regimen.   Current medicines are reviewed at length with the patient today.  T Labs/ tests ordered today include: scheduled sleep study No orders of the defined types were placed in this encounter.     Disposition:   FU with [place] after sleep study is completed. Signed, Joni Reining, NP  03/01/2014 10:18 AM    [facility] [address] [place], [place] [address] Phone: [phone]; Fax: [phone]

## 2014-03-01 NOTE — Patient Instructions (Addendum)
Your physician recommends that you schedule a follow-up appointment in: with Ms Lyman BishopLawrence NP after sleep study  Sleep center [phone] if you need need to cancel , Maralyn SagoSarah is who you spoke to. Your appointment is scheduled for Sunday march 6th at 8 PM. Go into ER registartion     Thank you for choosing [facility] !

## 2014-03-01 NOTE — Progress Notes (Deleted)
Name: Beth Sanford    DOB: 11/20/1970  Age: 44 y.o.  MR#: [id number]       PCP:  No PCP Per Patient      Insurance: Payor: MED PAY / Plan: MED PAY ASSURANCE / Product Type: *No Product type* /   CC:    Chief Complaint  Patient presents with  . Hypertension  . Cardiomyopathy  . Chest Pain    VS Filed Vitals:   03/01/14 1438  BP: 132/92  Pulse: 82  Height: 5' 2.5" (1.588 m)  Weight: 170 lb (77.111 kg)  SpO2: 96%    Weights Current Weight  03/01/14 170 lb (77.111 kg)  02/01/14 169 lb (76.658 kg)  01/08/14 169 lb (76.658 kg)    Blood Pressure  BP Readings from Last 3 Encounters:  03/01/14 132/92  02/01/14 145/102  01/08/14 140/110     Admit date:  (Not on file) Last encounter with RMR:  02/01/2014   Allergy Codeine and Tramadol  Current Outpatient Prescriptions  Medication Sig Dispense Refill  . ALPRAZolam (XANAX) 1 MG tablet Take 1 tablet (1 mg total) by mouth 4 (four) times daily. 120 tablet 2  . Ca Carbonate-Mag Hydroxide (ROLAIDS PO) Take 2 tablets by mouth daily as needed (heartburn).    . citalopram (CELEXA) 20 MG tablet Take 1 tablet (20 mg total) by mouth daily. 30 tablet 2  . hydrochlorothiazide (HYDRODIURIL) 25 MG tablet Take 1 tablet (25 mg total) by mouth daily. 30 tablet 3  . isosorbide mononitrate (IMDUR) 30 MG 24 hr tablet Take 1 tablet (30 mg total) by mouth daily. 90 tablet 3  . lisinopril (PRINIVIL,ZESTRIL) 20 MG tablet Take 1 tab 2 times daily 30 tablet 3  . mirtazapine (REMERON) 30 MG tablet Take 1 tablet (30 mg total) by mouth at bedtime. 30 tablet 2  . Omega-3 Fatty Acids (FISH OIL PO) Take by mouth daily.    . simvastatin (ZOCOR) 20 MG tablet Take 1 tablet (20 mg total) by mouth at bedtime. 90 tablet 3  . verapamil (CALAN SR) 180 MG CR tablet Take 1 tablet (180 mg total) by mouth at bedtime. 90 tablet 3   No current facility-administered medications for this visit.    Discontinued Meds:   There are no discontinued medications.  Patient  Active Problem List   Diagnosis Date Noted  . Major depression 06/08/2013  . Chest pain 11/01/2012  . HTN (hypertension) 11/01/2012  . Hyperlipidemia 11/01/2012    LABS    Component Value Date/Time   NA 133* 10/19/2012 1423   K 3.8 10/19/2012 1423   CL 100 10/19/2012 1423   CO2 19 10/19/2012 1423   GLUCOSE 122* 10/19/2012 1423   BUN 5* 10/19/2012 1423   CREATININE 0.60 10/19/2012 1423   CALCIUM 8.8 10/19/2012 1423   GFRNONAA >90 10/19/2012 1423   GFRAA >90 10/19/2012 1423   CMP     Component Value Date/Time   NA 133* 10/19/2012 1423   K 3.8 10/19/2012 1423   CL 100 10/19/2012 1423   CO2 19 10/19/2012 1423   GLUCOSE 122* 10/19/2012 1423   BUN 5* 10/19/2012 1423   CREATININE 0.60 10/19/2012 1423   CALCIUM 8.8 10/19/2012 1423   GFRNONAA >90 10/19/2012 1423   GFRAA >90 10/19/2012 1423       Component Value Date/Time   WBC 11.4* 10/19/2012 1423   WBC 12.7* 05/12/2012 1435   HGB 15.4* 10/19/2012 1423   HGB 15.8* 05/12/2012 1435   HCT 44.4 10/19/2012 1423  HCT 46.0 05/12/2012 1435   MCV 95.3 10/19/2012 1423   MCV 94.5 05/12/2012 1435    Lipid Panel     Component Value Date/Time   CHOL 173 04/06/2013 1333   TRIG 194* 04/06/2013 1333   HDL 44 04/06/2013 1333   CHOLHDL 3.9 04/06/2013 1333   VLDL 39 04/06/2013 1333   LDLCALC 90 04/06/2013 1333    ABG No results found for: PHART, PCO2ART, PO2ART, HCO3, TCO2, ACIDBASEDEF, O2SAT   Lab Results  Component Value Date   TSH 1.061 12/12/2013   BNP (last 3 results) No results for input(s): BNP in the last 8760 hours.  ProBNP (last 3 results) No results for input(s): PROBNP in the last 8760 hours.  Cardiac Panel (last 3 results) No results for input(s): CKTOTAL, CKMB, TROPONINI, RELINDX in the last 72 hours.  Iron/TIBC/Ferritin/ %Sat No results found for: IRON, TIBC, FERRITIN, IRONPCTSAT   EKG Orders placed or performed in visit on 09/11/13  . EKG 12-Lead     Prior Assessment and Plan Problem List as of  03/01/2014      Cardiovascular and Mediastinum   HTN (hypertension)   Last Assessment & Plan 02/01/2014 Office Visit Written 02/01/2014  5:00 PM by Jodelle Gross, NP    Her blood pressure remains difficult to control.  She does not tolerate the nitrates [place] do to recurrent headaches.  She is not taking and nitrates as directed as a result of this.  I advised her to take the medication with Tylenol, and follow with Tylenol every 6 hours until her body becomes use of this medication.  I have reassured her that the headache should get better.  In the interim, I will check a sleep study, to evaluate for sleep apnea contributing to difficult to control high blood pressure.  We will also change her verapamil from 80 mg twice a day to [place] XR 180  mg daily.would continue the calcium channel blocker, as this will help with angina symptoms.  We will see her in the office on followup to discuss sleep study results and to check her blood pressure.  If she is unable to tolerate the nitrates, demand to take her off of it and continue to adjust the calcium channel blocker.        Other   Chest pain   Last Assessment & Plan 02/01/2014 Office Visit Written 02/01/2014  5:01 PM by Jodelle Gross, NP    Uncertain etiology, as she has been ruled out for CAD.  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  Some of this may be related to uncontrolled hypertension, versus high lung pressures.  Sleep study is ordered.  She will continue the nitrates as discussed above.      Hyperlipidemia   Last Assessment & Plan 02/01/2014 Office Visit Written 02/01/2014  5:02 PM by Jodelle Gross, NP    Continue risk management.  Simvastatin, and fish oil.  Followup labs in 6 months.      Major depression   Last Assessment & Plan 08/28/2013 Office Visit Written 08/28/2013  3:13 PM by Jodelle Gross, NP    Followed by Behavioral Health          Imaging: No results found.

## 2014-03-04 ENCOUNTER — Ambulatory Visit (INDEPENDENT_AMBULATORY_CARE_PROVIDER_SITE_OTHER): Payer: Self-pay | Admitting: Psychology

## 2014-03-04 ENCOUNTER — Encounter (HOSPITAL_COMMUNITY): Payer: Self-pay | Admitting: Psychology

## 2014-03-04 DIAGNOSIS — F331 Major depressive disorder, recurrent, moderate: Secondary | ICD-10-CM

## 2014-03-04 DIAGNOSIS — F411 Generalized anxiety disorder: Secondary | ICD-10-CM

## 2014-03-04 NOTE — Progress Notes (Signed)
     PROGRESS NOTE  Patient:  Griselda MinerSheree C Glennon   DOB: 01/05/1970  MR Number: [id number]  Location: [facility] [address] [place] [address] Dept: [phone]  Start: 3 PM End: 4 PM  Provider/Observer:     Hershal CoriaJohn R Elishua Radford PSYD  Chief Complaint:      Chief Complaint  Patient presents with  . Anxiety  . Depression  . Stress    Reason For Service:     The patient was referred by Dr. Tenny Crawoss for psychotherapeutic interventions. The patient reports that she has had significant troubles with depression for many years. She reports that she had been seen Dr. Tiburcio PeaHarris for psychiatric services as well as a counselor and like them both by Dr. Tiburcio Pe[place]Harris [place]. She then tried a new psychiatrist and did not particularly related to them very well and stopped seeing him. She is now return in and started seeing Dr. Tenny Crawoss. The patient reports that she's been without her medications for depression and anxiety for some time. The patient reports that she has started back on her psychotropic medications and has been feeling better. However, she reports that she continues to be "stressed out" and has recurrent panic attacks. The patient also significant medical issues including congestive heart disease and COPD. The patient reports that while she has been feeling better she has continued to have a lot of anxiety and depression. Reports that she is in the process of quitting smoking cigarettes and has only been using E. cigarette/vapor and is also trying to stop that as well. The patient reports that there continues to be a lot of stress between her and her long-time boyfriend and she reports that he has not been time for related to work very well at all.  Interventions Strategy:  Cognitive/behavioral psychotherapeutic interventions  Participation Level:   Active  Participation Quality:  Appropriate       Behavioral Observation:  Well Groomed, Alert, and Appropriate.   Current Psychosocial Factors: The patient reports that the woman she has been helping has continued to do very poorly and in and out of hospital and the patient is very stressed and upset.    Content of Session:   Review current symptoms and work on therapeutic interventions for issues related to recurrent depression as well as anxiety.  Current Status:   The patient reports that she continues to experience depression and fatigue.  She reports that depression has been strong, not sleeping at night.     Patient Progress:   Stable with improvement initially from psychotropic interventions.  Target Goals:   Target goals include reducing the intensity, severity, and duration of symptoms of depression including feelings of hopelessness and helplessness, social isolation, and withdrawal from others. The patient reports that she would also like to work specifically on reducing the frequency and intensity of her anxiety and panic-like symptoms  Last Reviewed:   03/04/2014  Goals Addressed Today:    Today we worked on Producer, television/film/videobuilding coping skills and strategies utilizing the cognitive approach around her interpretation of her situation in her life.  Impression/Diagnosis:   The patient has a long history of recurrent symptoms of depression without psychotic features. She also describes significant symptoms of anxiety and possible panic attack.  Diagnosis:    Axis I: Major depressive disorder, recurrent episode, moderate  Generalized anxiety disorder   Autum Benfer R, PsyD

## 2014-03-10 ENCOUNTER — Ambulatory Visit: Payer: Self-pay

## 2014-03-11 ENCOUNTER — Ambulatory Visit (INDEPENDENT_AMBULATORY_CARE_PROVIDER_SITE_OTHER): Payer: Self-pay | Admitting: Psychiatry

## 2014-03-11 ENCOUNTER — Encounter (HOSPITAL_COMMUNITY): Payer: Self-pay | Admitting: Psychiatry

## 2014-03-11 VITALS — BP 169/118 | HR 85 | Ht 62.5 in | Wt 171.2 lb

## 2014-03-11 DIAGNOSIS — F411 Generalized anxiety disorder: Secondary | ICD-10-CM

## 2014-03-11 DIAGNOSIS — F331 Major depressive disorder, recurrent, moderate: Secondary | ICD-10-CM

## 2014-03-11 MED ORDER — MIRTAZAPINE 30 MG PO TABS: 30.0000 mg | ORAL_TABLET | Freq: Every day | ORAL | Status: DC

## 2014-03-11 MED ORDER — ALPRAZOLAM 1 MG PO TABS: 1.0000 mg | ORAL_TABLET | Freq: Four times a day (QID) | ORAL | Status: DC

## 2014-03-11 MED ORDER — CITALOPRAM HYDROBROMIDE 20 MG PO TABS: 20.0000 mg | ORAL_TABLET | Freq: Every day | ORAL | Status: DC
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
## 2014-03-28 ENCOUNTER — Ambulatory Visit (INDEPENDENT_AMBULATORY_CARE_PROVIDER_SITE_OTHER): Payer: Self-pay | Admitting: Psychology

## 2014-03-28 ENCOUNTER — Encounter (HOSPITAL_COMMUNITY): Payer: Self-pay | Admitting: Psychology

## 2014-03-28 DIAGNOSIS — F331 Major depressive disorder, recurrent, moderate: Secondary | ICD-10-CM

## 2014-03-28 DIAGNOSIS — F411 Generalized anxiety disorder: Secondary | ICD-10-CM

## 2014-03-28 NOTE — Progress Notes (Signed)
PROGRESS NOTE  Patient:  Beth Sanford   DOB: 08/12/1970  MR Number: [id number]  Location: BEHAVIORAL [facility] [address] [place] [address] Dept: [phone]  Start: 3 PM End: 4 PM  Provider/Observer:     Hershal CoriaJohn R Lyriq Finerty PSYD  Chief Complaint:      Chief Complaint  Patient presents with  . Anxiety  . Depression    Reason For Service:     The patient was referred by Dr. Tenny Crawoss for psychotherapeutic interventions. The patient reports that she has had significant troubles with depression for many years. She reports that she had been seen Dr. Tiburcio PeaHarris for psychiatric services as well as a counselor and like them both by Dr. Tiburcio [facility]Harris [place]. She then tried a new psychiatrist and did not particularly related to them very well and stopped seeing him. She is now return in and started seeing Dr. Tenny Crawoss. The patient reports that she's been without her medications for depression and anxiety for some time. The patient reports that she has started back on her psychotropic medications and has been feeling better. However, she reports that she continues to be "stressed out" and has recurrent panic attacks. The patient also significant medical issues including congestive heart disease and COPD. The patient reports that while she has been feeling better she has continued to have a lot of anxiety and depression. Reports that she is in the process of quitting smoking cigarettes and has only been using E. cigarette/vapor and is also trying to stop that as well. The patient reports that there continues to be a lot of stress between her and her long-time boyfriend and she reports that he has not been time for related to work very well at all.  Interventions Strategy:  Cognitive/behavioral psychotherapeutic interventions  Participation Level:   Active  Participation Quality:  Appropriate       Behavioral Observation:  Well Groomed, Alert, and Appropriate.   Current Psychosocial Factors: The patient reports that the woman she has been helping has continued to do very poorly and in and out of hospital and the patient is very stressed and upset.    Content of Session:   Review current symptoms and work on therapeutic interventions for issues related to recurrent depression as well as anxiety.  Current Status:   The patient reports that she continues to experience depression and fatigue.  She has had more chest pain, but cardiologist says it is not a heart attack.  It still worries patient that she is going to die.  Patient reports that anxiety worsening more than depressionl     Patient Progress:   Stable with improvement initially from psychotropic interventions.  Target Goals:   Target goals include reducing the intensity, severity, and duration of symptoms of depression including feelings of hopelessness and helplessness, social isolation, and withdrawal from others. The patient reports that she would also like to work specifically on reducing the frequency and intensity of her anxiety and panic-like symptoms  Last Reviewed:   03/28/2014  Goals Addressed Today:    Today we worked on Producer, television/film/videobuilding coping skills and strategies utilizing the cognitive approach around her interpretation of her situation in her life.  Impression/Diagnosis:   The patient has a long history of recurrent symptoms of depression without psychotic features. She also describes significant symptoms of anxiety and possible panic attack.  Diagnosis:    Axis I: Major depressive disorder, recurrent  episode, moderate  Generalized anxiety disorder   Naresh Althaus R, PsyD

## 2014-04-01 ENCOUNTER — Encounter: Payer: Self-pay | Admitting: Adult Health

## 2014-04-01 NOTE — Progress Notes (Signed)
Cardiology Office Note   Cancelled  

## 2014-05-01 ENCOUNTER — Ambulatory Visit (HOSPITAL_COMMUNITY): Payer: Self-pay | Admitting: Psychology

## 2014-05-01 ENCOUNTER — Encounter (HOSPITAL_COMMUNITY): Payer: Self-pay | Admitting: Psychology

## 2014-05-12 IMAGING — CR DG SCAPULA*R*
2 series · 2 of 2 positions shown · non-contrast
Comparison: None.

CLINICAL DATA: Fell landing a right upper back 2 weeks ago,
persistent pain

EXAM:
RIGHT SCAPULA - 2+ VIEWS

[w scapula ap/pa right]
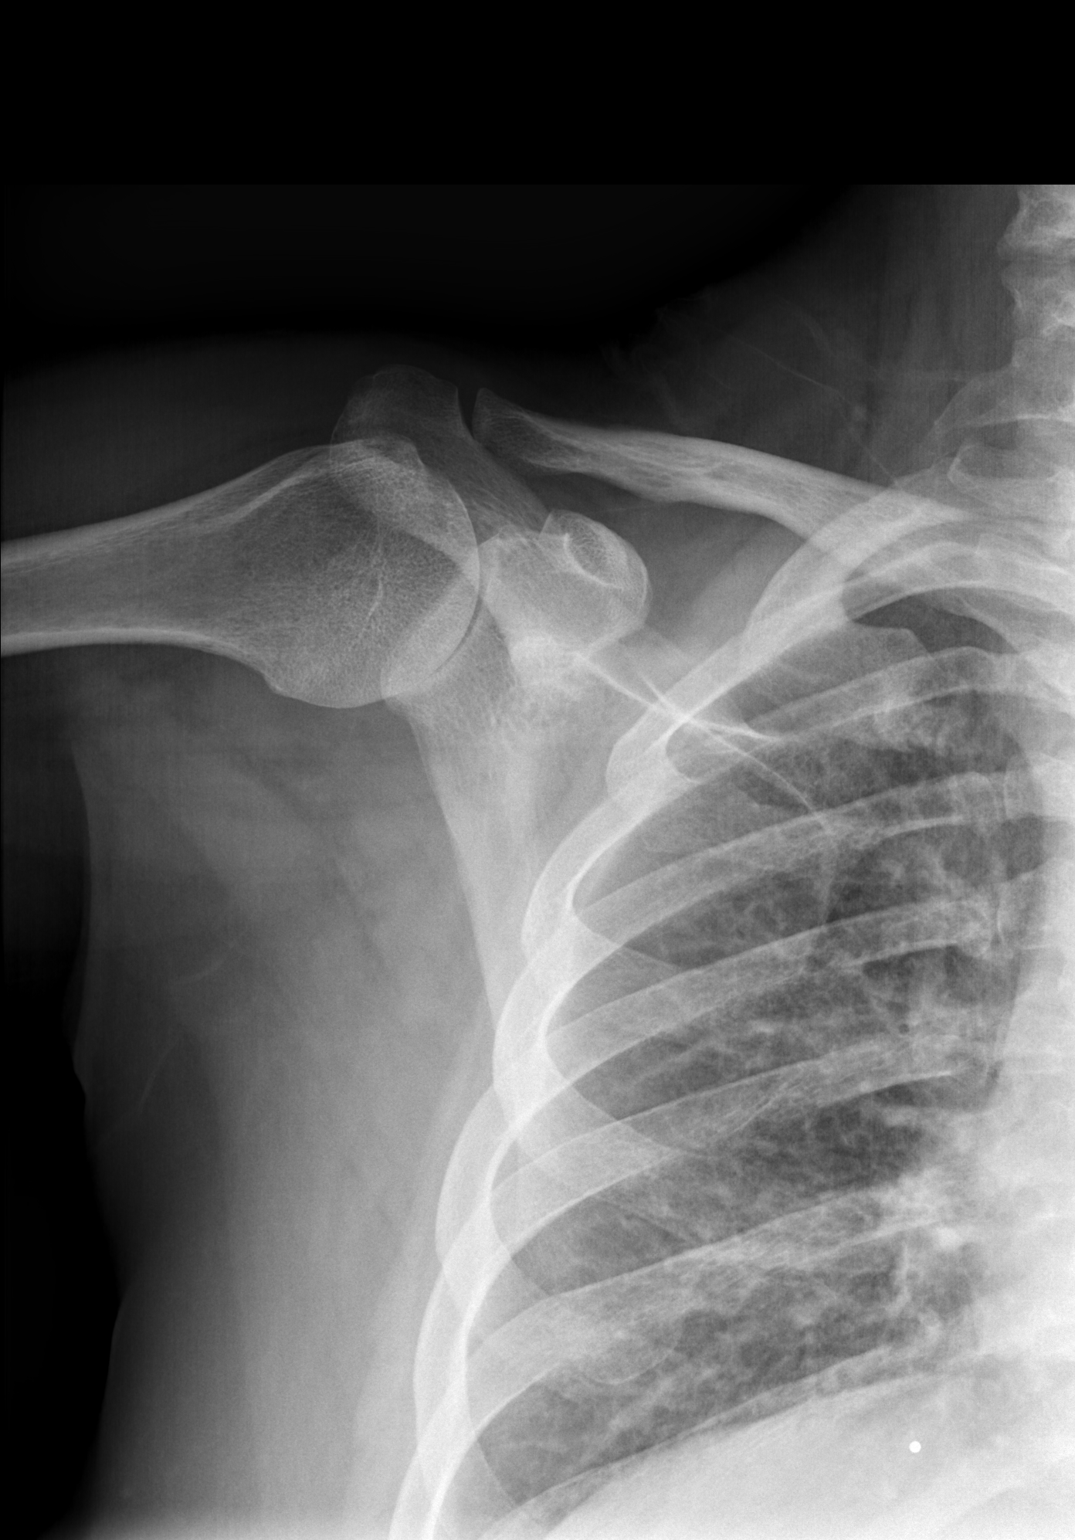

[w scapula lat right]
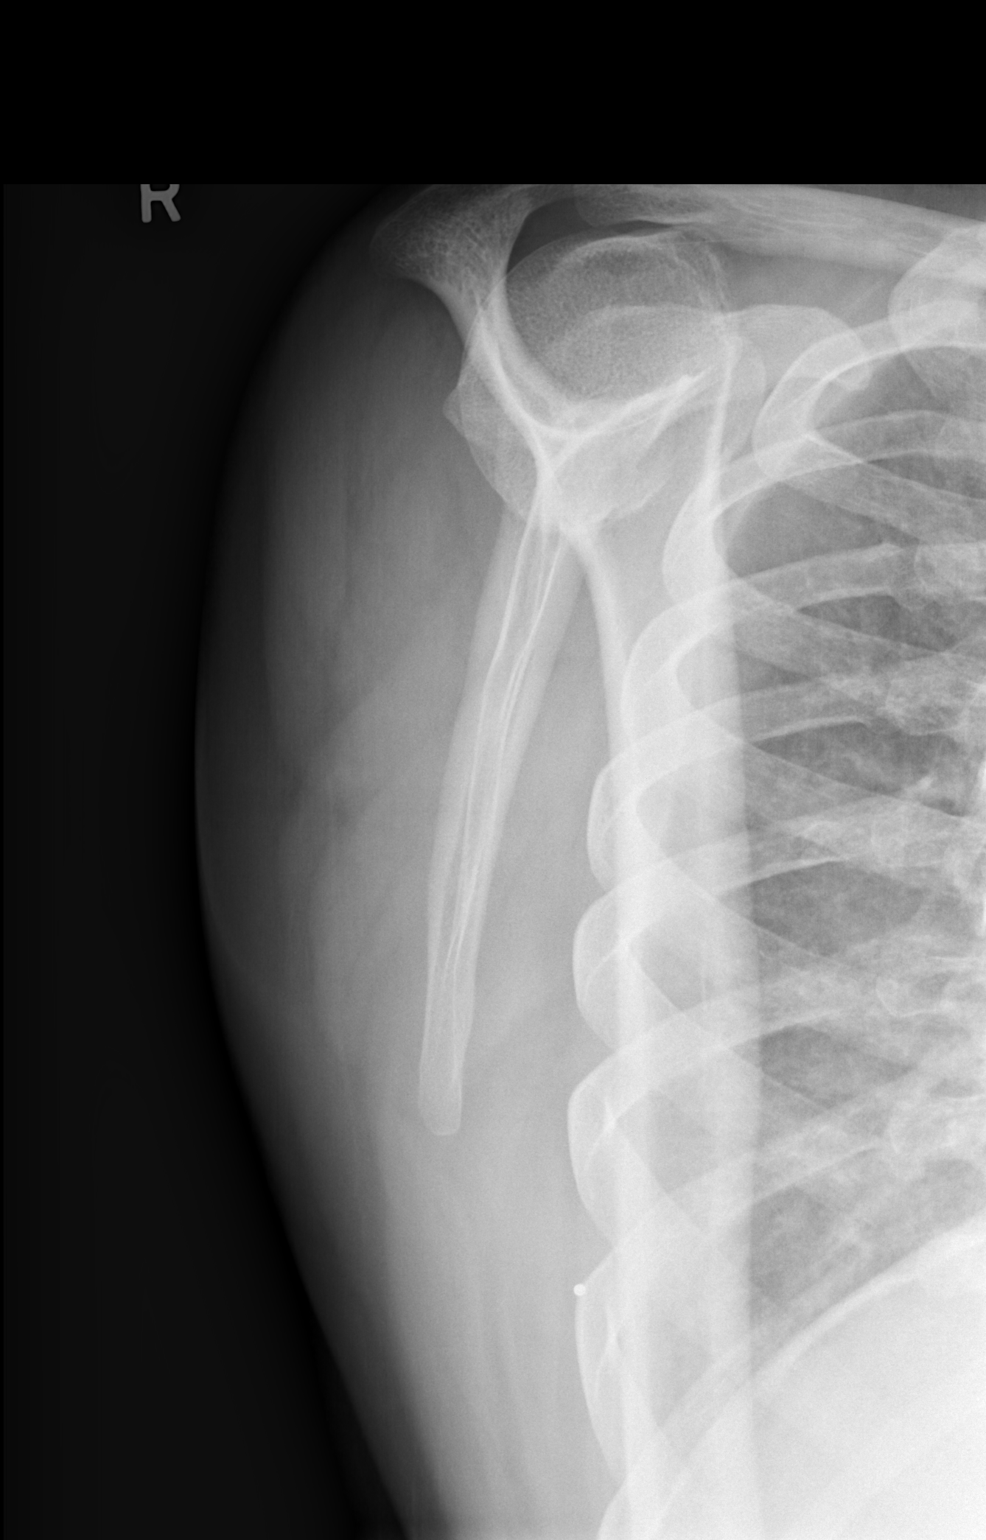

[2 of 2 positions shown; findings below may reference images not displayed]

FINDINGS: Osseous mineralization grossly normal.

AC joint alignment normal.

Mild glenohumeral joint space narrowing.

Question tiny cervical rib.

No acute fracture, dislocation, or bone destruction.

Visualized right ribs grossly intact.
IMPRESSION: No acute abnormalities.

## 2014-05-12 IMAGING — CR DG RIBS W/ CHEST 3+V*R*
5 series · 5 of 5 positions shown · non-contrast
Comparison: 10/19/2012

CLINICAL DATA: Shoulder injury.

EXAM:
RIGHT RIBS AND CHEST - 3+ VIEW

[w chest pa]
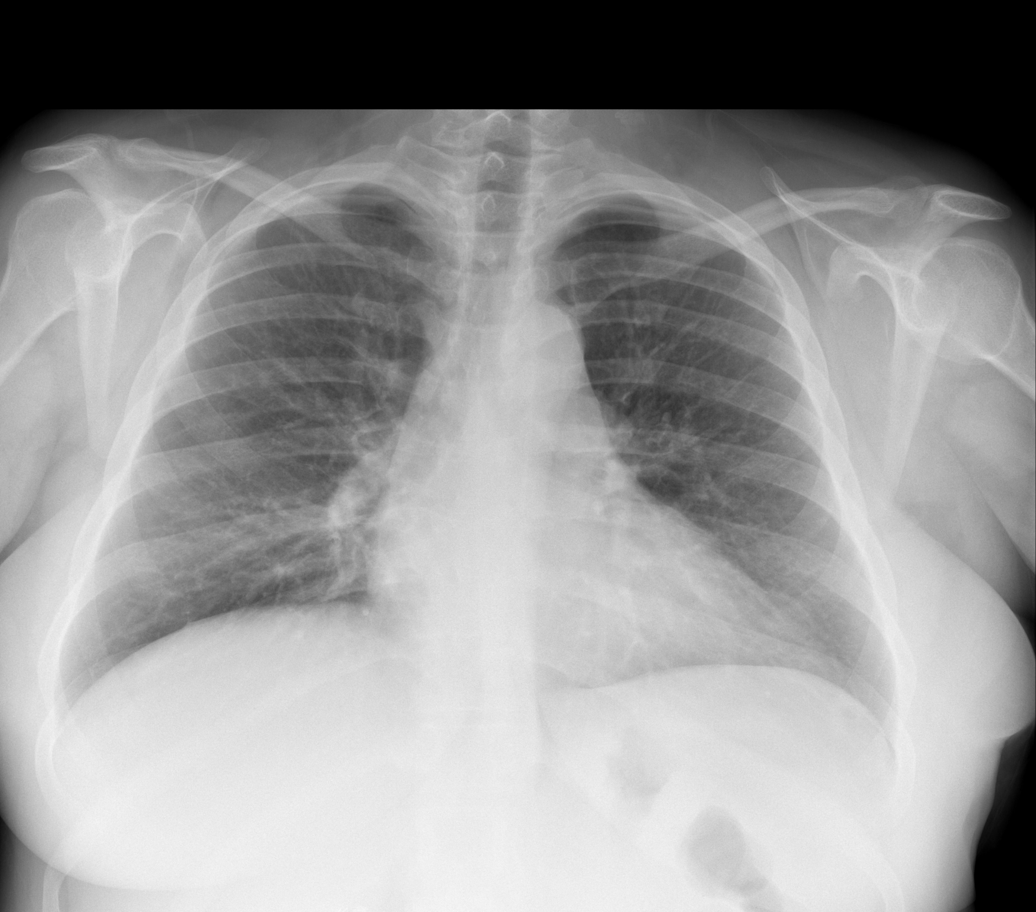

[w ribs ap/pa upper right]
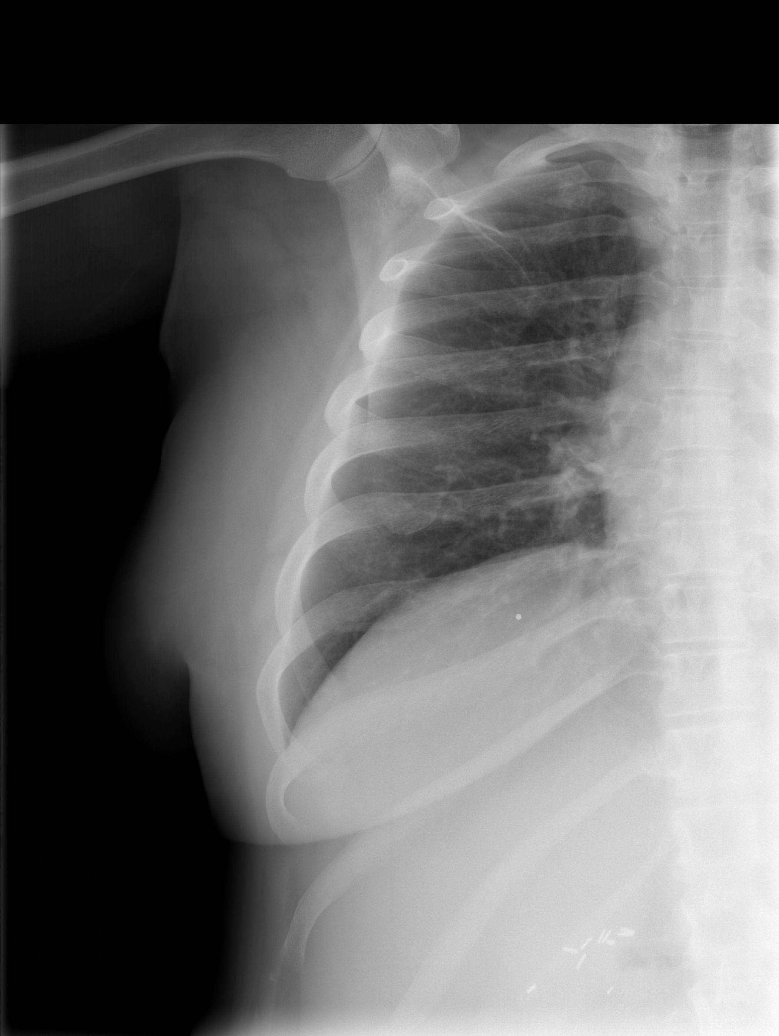

[w ribs ap/pa lower right]
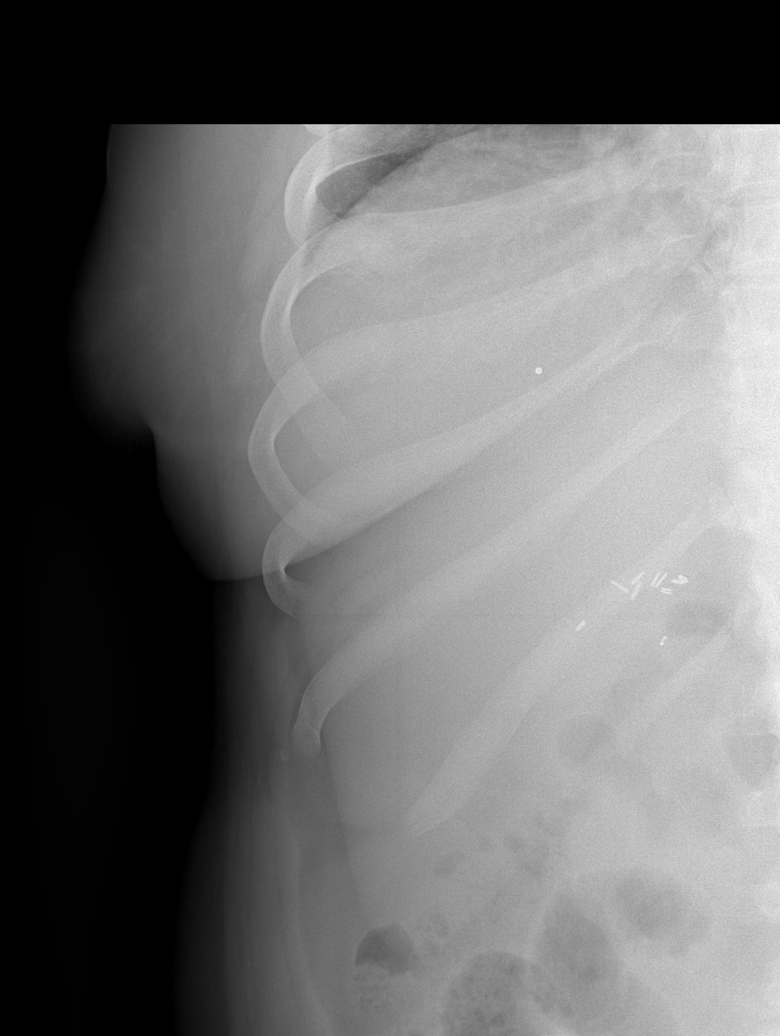

[w ribs oblique right *]
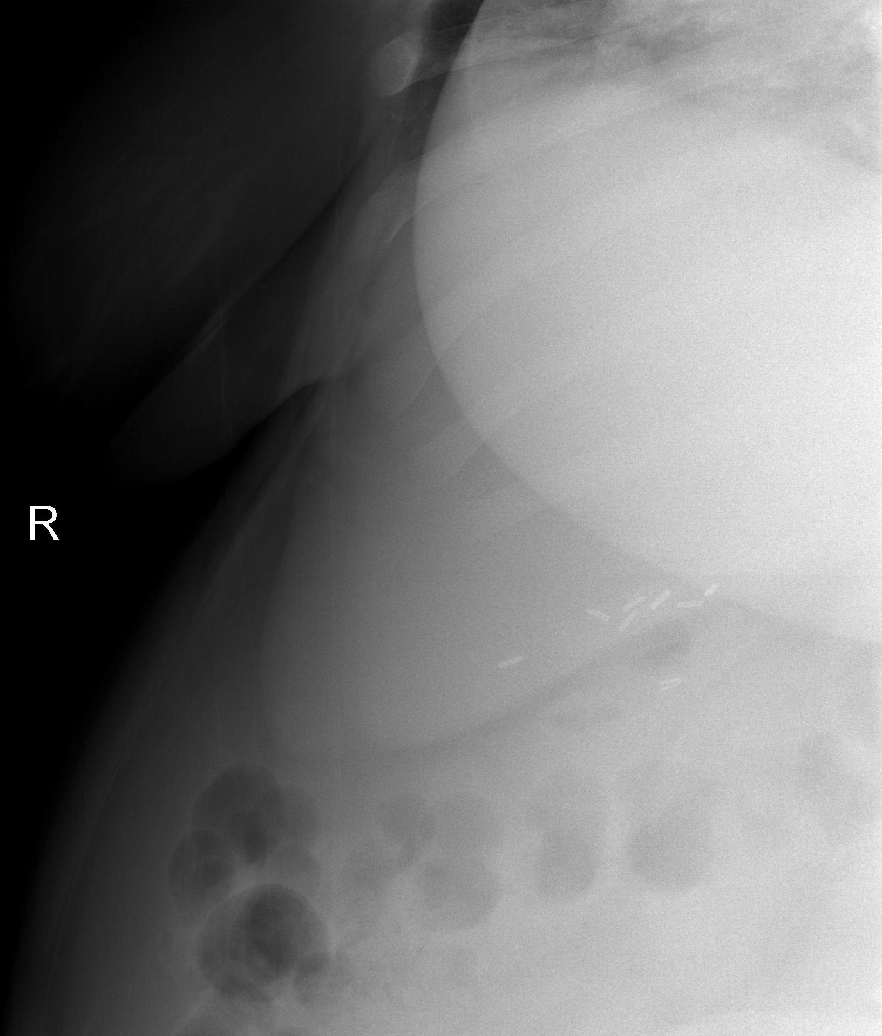

[w ribs oblique right]
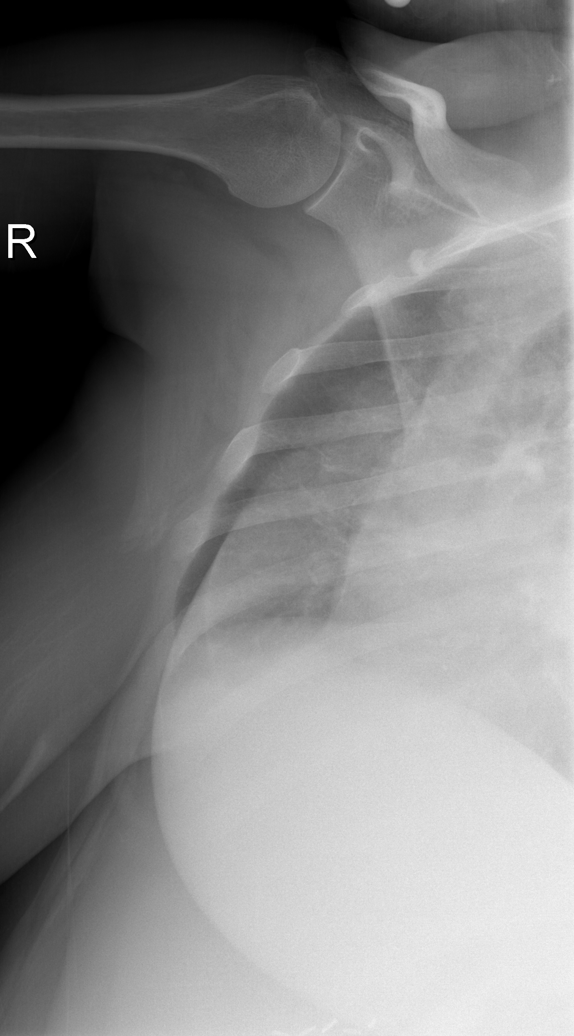

[5 of 5 positions shown; findings below may reference images not displayed]

FINDINGS: No fracture or other bone lesions are seen involving the ribs. There
is no evidence of pneumothorax or pleural effusion. Both lungs are
clear. Heart size and mediastinal contours are within normal limits.
IMPRESSION: Negative.

## 2014-05-27 ENCOUNTER — Encounter: Payer: Self-pay | Admitting: Adult Health

## 2014-05-27 ENCOUNTER — Ambulatory Visit: Payer: Self-pay | Admitting: Adult Health

## 2014-05-27 NOTE — Progress Notes (Signed)
Cardiology Office Note  Error No Show

## 2014-06-06 ENCOUNTER — Other Ambulatory Visit (HOSPITAL_COMMUNITY): Payer: Self-pay | Admitting: Psychiatry

## 2014-06-06 ENCOUNTER — Telehealth (HOSPITAL_COMMUNITY): Payer: Self-pay | Admitting: *Deleted

## 2014-06-06 DIAGNOSIS — F331 Major depressive disorder, recurrent, moderate: Secondary | ICD-10-CM

## 2014-06-06 NOTE — Telephone Encounter (Signed)
Pt called stating she would like to get refills for her Xanax, Remeron and Celexa. All three medication was filled 03-11-2014 with 3 refills. Pt next appt is scheduled for 06-11-14 and per pt she will be out of her medication 06-07-14.

## 2014-06-06 NOTE — Telephone Encounter (Signed)
She should have refills, please check with pharmacy

## 2014-06-07 ENCOUNTER — Telehealth (HOSPITAL_COMMUNITY): Payer: Self-pay | Admitting: *Deleted
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
## 2014-06-07 NOTE — Telephone Encounter (Signed)
Spoke with pt and informed her of what Dr. Tenny Crawoss stated and she agreed  And stated she will [place] her pharmacy. Per pt it might a mix up

## 2014-06-10 NOTE — Telephone Encounter (Signed)
noted 

## 2014-06-10 NOTE — Telephone Encounter (Signed)
Patient last given a prescription for Xanax on 03/11/14 plus 2 refills.  Returns to see Dr. Tenny Crawoss on 06/11/14 so should have enough until appointment.

## 2014-06-10 NOTE — Telephone Encounter (Signed)
Telephone Beth Sanford with Beth Sanford, CMA at the [facility] about patient's request for a refill of her prescribed Xanax.  Discussed patient's status with last order given on 03/11/14 with 2 refills.  Patient stating to patient she is out of her Xanax and cannot go until 06/11/14 when she is scheduled to see Dr. Tenny Crawoss.  Called [facility] pharmacy [place] who reported hey last filled Xanax 1mg , qid , #120 on 03/06/14 from a January.  Called [facility] who stated they filled patient's 03/11/14 prescription on 03/11/14, 04/08/14 and 05/05/14 as Beth Sanford acknowledged they were filling approximately 3 days early each time.  Discussed with Beth Sanford as patient had over 480 Xanax 1mg  pills filled since 03/06/14.  Beth Sanford authorized 4 pills to be called into [facility] until patient sees Dr. Tenny Crawoss on 06/11/14.  Called patient to question why filling medications early each month and about the other prescription filled [facility] [place].  Patient at first denied but then only stated she changed pharmacies due to cost of the medication.  After more discussion patient stated she was going through a lot currently with her grandmother in hospice and not expected to live and stated she may have taken more than 4 pills per day on occasions.  Informed patient 4 pills would be called into [facility] pharmacy today and then patient would need to discuss with Dr. Tenny Crawoss at appointment on 06/11/14 due to concerns of misuse and potential danger for overuse of Benzodiazepines.  Patient agreed with plan and called in 4, 1mg  Xanax pills with qid instructions from Dr. Nelly RoutArchana Kumar, MD.  Patient to see Dr. Tenny Crawoss on 06/11/14 to discuss any further refills.

## 2014-06-11 ENCOUNTER — Encounter (HOSPITAL_COMMUNITY): Payer: Self-pay | Admitting: Psychiatry

## 2014-06-11 ENCOUNTER — Ambulatory Visit (INDEPENDENT_AMBULATORY_CARE_PROVIDER_SITE_OTHER): Payer: Self-pay | Admitting: Psychiatry

## 2014-06-11 VITALS — BP 150/100 | Ht 62.0 in | Wt 168.0 lb

## 2014-06-11 DIAGNOSIS — F332 Major depressive disorder, recurrent severe without psychotic features: Secondary | ICD-10-CM

## 2014-06-11 DIAGNOSIS — F331 Major depressive disorder, recurrent, moderate: Secondary | ICD-10-CM

## 2014-06-11 DIAGNOSIS — F411 Generalized anxiety disorder: Secondary | ICD-10-CM

## 2014-06-11 MED ORDER — ALPRAZOLAM 1 MG PO TABS: 1.0000 mg | ORAL_TABLET | Freq: Four times a day (QID) | ORAL | Status: DC

## 2014-06-11 MED ORDER — CITALOPRAM HYDROBROMIDE 20 MG PO TABS: 20.0000 mg | ORAL_TABLET | Freq: Every day | ORAL | Status: DC

## 2014-06-11 MED ORDER — MIRTAZAPINE 30 MG PO TABS: 30.0000 mg | ORAL_TABLET | Freq: Every day | ORAL | Status: DC
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
## 2014-06-12 ENCOUNTER — Other Ambulatory Visit: Payer: Self-pay | Admitting: Cardiovascular Disease

## 2014-06-12 MED ORDER — HYDROCHLOROTHIAZIDE 25 MG PO TABS: 25.0000 mg | ORAL_TABLET | Freq: Every day | ORAL | Status: DC

## 2014-06-12 NOTE — Telephone Encounter (Signed)
Patient wants refill on HCTZ  Sent to [facility] / [facility]

## 2014-06-14 ENCOUNTER — Encounter (HOSPITAL_COMMUNITY): Payer: Self-pay | Admitting: Psychology

## 2014-06-14 ENCOUNTER — Ambulatory Visit (INDEPENDENT_AMBULATORY_CARE_PROVIDER_SITE_OTHER): Payer: Self-pay | Admitting: Psychology

## 2014-06-14 ENCOUNTER — Ambulatory Visit: Payer: Self-pay | Attending: Adult Health | Admitting: Sleep Medicine

## 2014-06-14 DIAGNOSIS — F411 Generalized anxiety disorder: Secondary | ICD-10-CM

## 2014-06-14 DIAGNOSIS — R400 Somnolence: Secondary | ICD-10-CM

## 2014-06-14 DIAGNOSIS — F331 Major depressive disorder, recurrent, moderate: Secondary | ICD-10-CM

## 2014-06-14 DIAGNOSIS — G4710 Hypersomnia, unspecified: Secondary | ICD-10-CM | POA: Insufficient documentation

## 2014-06-14 NOTE — Progress Notes (Signed)
PROGRESS NOTE  Patient:  Beth Sanford   DOB: 06-15-1970  MR Number: [id number]  Location: [facility] [address] [place] [address] Dept: [phone]  Start: 2 PM End: 3 PM  Provider/Observer:     Hershal Coria PSYD  Chief Complaint:      Chief Complaint  Patient presents with  . Anxiety  . Depression    Reason For Service:     The patient was referred by Dr. Tenny Craw for psychotherapeutic interventions. The patient reports that she has had significant troubles with depression for many years. She reports that she had been seen Dr. Tiburcio Pea for psychiatric services as well as a counselor and like them both by Dr. Tiburcio Pea moved her office to [facility]. She then tried a new psychiatrist and did not particularly related to them very well and stopped seeing him. She is now return in and started seeing Dr. Tenny Craw. The patient reports that she's been without her medications for depression and anxiety for some time. The patient reports that she has started back on her psychotropic medications and has been feeling better. However, she reports that she continues to be "stressed out" and has recurrent panic attacks. The patient also significant medical issues including congestive heart disease and COPD. The patient reports that while she has been feeling better she has continued to have a lot of anxiety and depression. Reports that she is in the process of quitting smoking cigarettes and has only been using E. cigarette/vapor and is also trying to stop that as well. The patient reports that there continues to be a lot of stress between her and her long-time boyfriend and she reports that he has not been time for related to work very well at all.  Interventions Strategy:  Cognitive/behavioral psychotherapeutic interventions  Participation Level:   Active  Participation Quality:  Appropriate       Behavioral Observation:  Well Groomed, Alert, and Appropriate.   Current Psychosocial Factors: The patient reports that she has have SSI hearing and will be having a review about medicaid.  She has been stressed by mother etc.    Content of Session:   Review current symptoms and work on therapeutic interventions for issues related to recurrent depression as well as anxiety.  Current Status:   The patient reports that she continues to experience depression and fatigue.  She has had more chest pain, but cardiologist says it is not a heart attack.  It still worries patient that she is going to die.  Patient reports that anxiety worsening more than depressionl     Patient Progress:   Stable with improvement initially from psychotropic interventions.  Target Goals:   Target goals include reducing the intensity, severity, and duration of symptoms of depression including feelings of hopelessness and helplessness, social isolation, and withdrawal from others. The patient reports that she would also like to work specifically on reducing the frequency and intensity of her anxiety and panic-like symptoms  Last Reviewed:   06/14/2014  Goals Addressed Today:    Today we worked on Producer, television/film/video and strategies utilizing the cognitive approach around her interpretation of her situation in her life.  Impression/Diagnosis:   The patient has a long history of recurrent symptoms of depression without psychotic features. She also describes significant symptoms of anxiety and possible panic attack.  Diagnosis:    Axis I: Major depressive disorder, recurrent episode, moderate  Generalized anxiety  disorder   Luqman Perrelli R, PsyD

## 2014-06-15 NOTE — Sleep Study (Signed)
  [facility] Beth Sanford A. Gerilyn Pilgrim, MD     [email or web address]        NOCTURNAL POLYSOMNOGRAM    LOCATION: SLEEP LAB FACILITY:    PHYSICIAN: Crickett Abbett A. Gerilyn Pilgrim, M.D.   DATE OF STUDY: 06/14/2014.   REFERRING PHYSICIAN: Joni Reining, NP.   INDICATIONS: The patient is a 44 year old presents with witnessed apnea, snoring and hypersomnia/fatigue.  MEDICATIONS:  Prior to Admission medications   Medication Sig Start Date End Date Taking? Authorizing Provider  ALPRAZolam Prudy Feeler) 1 MG tablet Take 1 tablet (1 mg total) by mouth 4 (four) times daily. 06/11/14   Myrlene Broker, MD  Ca Carbonate-Mag Hydroxide (ROLAIDS PO) Take 2 tablets by mouth daily as needed (heartburn).    Historical Provider, MD  citalopram (CELEXA) 20 MG tablet Take 1 tablet (20 mg total) by mouth daily. 06/11/14 06/11/15  Myrlene Broker, MD  hydrochlorothiazide (HYDRODIURIL) 25 MG tablet Take 1 tablet (25 mg total) by mouth daily. 06/12/14   Jodelle Gross, NP  isosorbide mononitrate (IMDUR) 30 MG 24 hr tablet Take 1 tablet (30 mg total) by mouth daily. 12/12/13   Laqueta Linden, MD  lisinopril (PRINIVIL,ZESTRIL) 20 MG tablet Take 1 tab 2 times daily 12/12/13   Laqueta Linden, MD  mirtazapine (REMERON) 30 MG tablet Take 1 tablet (30 mg total) by mouth at bedtime. 06/11/14 06/11/15  Myrlene Broker, MD  Omega-3 Fatty Acids (FISH OIL PO) Take by mouth daily.    Historical Provider, MD  simvastatin (ZOCOR) 20 MG tablet Take 1 tablet (20 mg total) by mouth at bedtime. 12/27/12   Laqueta Linden, MD  verapamil (CALAN SR) 180 MG CR tablet Take 1 tablet (180 mg total) by mouth at bedtime. 02/01/14   Jodelle Gross, NP      EPWORTH SLEEPINESS SCALE: 3.   BMI: 34.   ARCHITECTURAL SUMMARY: Total recording time was 409 minutes. Sleep efficiency 87 %. Sleep latency 21 minutes. REM latency 86 minutes. Stage NI 5 %, N2 77 % and N3 2 % and REM sleep 16 %.    RESPIRATORY DATA:  Baseline oxygen saturation  is 95 %. The lowest saturation is 80 %. The diagnostic AHI is 17. The RDI is 17. The REM AHI is 58.  LIMB MOVEMENT SUMMARY: PLM index 0.   ELECTROCARDIOGRAM SUMMARY: Average heart rate is 70 with no significant dysrhythmias observed.   IMPRESSION:  1. Moderate obstructive sleep apnea syndrome worse during REM sleep. Formal CPAP titration is recommended.  Thanks for this referral.  Beth Sanford A. Gerilyn Pilgrim, M.D. Diplomat, Biomedical engineer of Sleep Medicine.

## 2014-06-21 ENCOUNTER — Ambulatory Visit (INDEPENDENT_AMBULATORY_CARE_PROVIDER_SITE_OTHER): Payer: Self-pay | Admitting: Adult Health

## 2014-06-21 ENCOUNTER — Encounter: Payer: Self-pay | Admitting: Adult Health

## 2014-06-21 VITALS — BP 116/78 | HR 81 | Ht 62.5 in | Wt 167.3 lb

## 2014-06-21 DIAGNOSIS — I10 Essential (primary) hypertension: Secondary | ICD-10-CM

## 2014-06-21 DIAGNOSIS — G4733 Obstructive sleep apnea (adult) (pediatric): Secondary | ICD-10-CM

## 2014-06-21 NOTE — Patient Instructions (Signed)
Your physician wants you to follow-up in: 6 months with Harriet Pho NP You will receive a reminder letter in the mail two months in advance. If you don't receive a letter, please Mandile our office to schedule the follow-up appointment.    Your physician recommends that you continue on your current medications as directed. Please refer to the Current Medication list given to you today.     You have been referred to Dr Billy Fischer     Thank you for choosing [facility] !

## 2014-06-21 NOTE — Progress Notes (Signed)
Cardiology Office Note   Date:  06/21/2014   ID:  Beth Sanford, DOB [birth year], MRN [id number]  PCP:  No PCP Per Patient  Cardiologist:  Inis Sizer, NP   Chief Complaint  Patient presents with  . Chest Pain  . Hypertension      History of Present Illness: Beth Sanford is a 44 y.o. female who presents for assessment and management of hypertensive heart disease, with complaints of chest pain.the patient had difficult to control, hypertension.  Was last seen in the office on 03/01/2014.  Medications were adjusted with improvement in blood pressure on that visit the patient was scheduled for a sleep study to evaluate for obstructive sleep apnea contributing to difficult to control hypertension.  The patient did have a sleep study on 06/14/2014.  This was read by Dr.[place], and found to be positive for moderate obstructive sleep apnea syndrome, worse during REM sleep.  Oral, CPAP titration was recommended.  The patient comes today.  Continue symptomatic with fatigue and sluggishness.  Her blood pressure is controlled on current medication regimen.  She remains compliant.  Past Medical History  Diagnosis Date  . Hypertension   . High cholesterol   . Anxiety   . Depression     Past Surgical History  Procedure Laterality Date  . Cholecystectomy    . Elbow surgery  left     Current Outpatient Prescriptions  Medication Sig Dispense Refill  . ALPRAZolam (XANAX) 1 MG tablet Take 1 tablet (1 mg total) by mouth 4 (four) times daily. 120 tablet 2  . Ca Carbonate-Mag Hydroxide (ROLAIDS PO) Take 2 tablets by mouth daily as needed (heartburn).    . citalopram (CELEXA) 20 MG tablet Take 1 tablet (20 mg total) by mouth daily. 30 tablet 2  . hydrochlorothiazide (HYDRODIURIL) 25 MG tablet Take 1 tablet (25 mg total) by mouth daily. 30 tablet 3  . isosorbide mononitrate (IMDUR) 30 MG 24 hr tablet Take 1 tablet (30 mg total) by mouth daily. 90 tablet 3  . lisinopril  (PRINIVIL,ZESTRIL) 20 MG tablet Take 1 tab 2 times daily 30 tablet 3  . mirtazapine (REMERON) 30 MG tablet Take 1 tablet (30 mg total) by mouth at bedtime. 30 tablet 2  . Omega-3 Fatty Acids (FISH OIL PO) Take by mouth daily.    . simvastatin (ZOCOR) 20 MG tablet Take 1 tablet (20 mg total) by mouth at bedtime. 90 tablet 3  . verapamil (CALAN SR) 180 MG CR tablet Take 1 tablet (180 mg total) by mouth at bedtime. 90 tablet 3   No current facility-administered medications for this visit.    Allergies:   Codeine and Tramadol    Social History:  The patient  reports that she quit smoking about 2 years ago. She started smoking about 30 years ago. She has never used smokeless tobacco. She reports that she does not drink alcohol or use illicit drugs.   Family History:  The patient's family history includes Alcohol abuse in her father; Bipolar disorder in her brother; Hypertension in her brother.    ROS: .   All other systems are reviewed and negative.Unless otherwise mentioned in H&P above.   PHYSICAL EXAM: VS:  There were no vitals taken for this visit. , BMI There is no weight on file to calculate BMI. GEN: Well nourished, well developed, in no acute distress HEENT: normal Neck: no JVD, carotid bruits, or masses Cardiac: RRR; no murmurs, rubs, or gallops,no edema  Respiratory:  clear to  auscultation bilaterally, normal work of breathing GI: soft, nontender, nondistended, + BS MS: no deformity or atrophy Skin: warm and dry, no rash Neuro:  Strength and sensation are intact Psych: euthymic mood, full affect Recent Labs: 12/12/2013: TSH 1.061    Lipid Panel    Component Value Date/Time   CHOL 173 04/06/2013 1333   TRIG 194* 04/06/2013 1333   HDL 44 04/06/2013 1333   CHOLHDL 3.9 04/06/2013 1333   VLDL 39 04/06/2013 1333   LDLCALC 90 04/06/2013 1333      Wt Readings from Last 3 Encounters:  06/14/14 168 lb (76.204 kg)  06/11/14 168 lb (76.204 kg)  03/11/14 171 lb 3.2 oz  (77.656 kg)      Other studies Reviewed: Additional studies/ records that were reviewed today include: sleep study Review of the above records demonstrates: moderate sleep apnea   ASSESSMENT AND PLAN:  1.  Hypertension: She is well-controlled currently on medication regimen.  Suspect obstructive sleep apnea is contributing to her blood pressure elevation.  Will not make any changes in medication at this time I feel she is followed by pulmonary medicine and institution of CPAP based upon abnormal sleep study.  She remains on multiple antihypertensives to include nitrates, lisinopril, verapamil, hydrochlorothiazide.  We will need to titrate these down once she becomes compliant with CPAP. She will followup with [place] in 3 months after institution of CPAP.  She is referred to Dr. Billy Fischer with [facility] as she is covered by patient assistance via [facility].   2. OSA: Beth Sanford diagnosis based upon abnormal sleep study.  Referral to pulmonology as discussed  Current medicines are reviewed at length with the patient today.    Labs/ tests ordered today include: None No orders of the defined types were placed in this encounter.     Disposition:   FU with 3-4 month  Signed, Joni Reining, NP  06/21/2014 7:34 AM    [facility] [address] [place], [place] [address] Phone: [phone]; Fax: [phone]

## 2014-06-21 NOTE — Progress Notes (Deleted)
Name: Beth Sanford    DOB: 06-Dec-1970  Age: 44 y.o.  MR#: [id number]       PCP:  No PCP Per Patient      Insurance: Payor: MED PAY / Plan: MED PAY ASSURANCE / Product Type: *No Product type* /   CC:    Chief Complaint  Patient presents with  . Chest Pain  . Hypertension    VS Filed Vitals:   06/21/14 1531  BP: 116/78  Pulse: 81  Height: 5' 2.5" (1.588 m)  Weight: 167 lb 4.8 oz (75.887 kg)  SpO2: 98%    Weights Current Weight  06/21/14 167 lb 4.8 oz (75.887 kg)  06/14/14 168 lb (76.204 kg)  06/11/14 168 lb (76.204 kg)    Blood Pressure  BP Readings from Last 3 Encounters:  06/21/14 116/78  06/11/14 150/100  03/11/14 169/118     Admit date:  (Not on file) Last encounter with RMR:  03/01/2014   Allergy Codeine and Tramadol  Current Outpatient Prescriptions  Medication Sig Dispense Refill  . ALPRAZolam (XANAX) 1 MG tablet Take 1 tablet (1 mg total) by mouth 4 (four) times daily. 120 tablet 2  . Ca Carbonate-Mag Hydroxide (ROLAIDS PO) Take 2 tablets by mouth daily as needed (heartburn).    . citalopram (CELEXA) 20 MG tablet Take 1 tablet (20 mg total) by mouth daily. 30 tablet 2  . hydrochlorothiazide (HYDRODIURIL) 25 MG tablet Take 1 tablet (25 mg total) by mouth daily. 30 tablet 3  . isosorbide mononitrate (IMDUR) 30 MG 24 hr tablet Take 1 tablet (30 mg total) by mouth daily. 90 tablet 3  . lisinopril (PRINIVIL,ZESTRIL) 20 MG tablet Take 1 tab 2 times daily 30 tablet 3  . mirtazapine (REMERON) 30 MG tablet Take 1 tablet (30 mg total) by mouth at bedtime. 30 tablet 2  . Omega-3 Fatty Acids (FISH OIL PO) Take by mouth daily.    . simvastatin (ZOCOR) 20 MG tablet Take 1 tablet (20 mg total) by mouth at bedtime. 90 tablet 3  . verapamil (CALAN SR) 180 MG CR tablet Take 1 tablet (180 mg total) by mouth at bedtime. 90 tablet 3   No current facility-administered medications for this visit.    Discontinued Meds:   There are no discontinued medications.  Patient Active  Problem List   Diagnosis Date Noted  . Major depression 06/08/2013  . Chest pain 11/01/2012  . HTN (hypertension) 11/01/2012  . Hyperlipidemia 11/01/2012    LABS    Component Value Date/Time   NA 133* 10/19/2012 1423   K 3.8 10/19/2012 1423   CL 100 10/19/2012 1423   CO2 19 10/19/2012 1423   GLUCOSE 122* 10/19/2012 1423   BUN 5* 10/19/2012 1423   CREATININE 0.60 10/19/2012 1423   CALCIUM 8.8 10/19/2012 1423   GFRNONAA >90 10/19/2012 1423   GFRAA >90 10/19/2012 1423   CMP     Component Value Date/Time   NA 133* 10/19/2012 1423   K 3.8 10/19/2012 1423   CL 100 10/19/2012 1423   CO2 19 10/19/2012 1423   GLUCOSE 122* 10/19/2012 1423   BUN 5* 10/19/2012 1423   CREATININE 0.60 10/19/2012 1423   CALCIUM 8.8 10/19/2012 1423   GFRNONAA >90 10/19/2012 1423   GFRAA >90 10/19/2012 1423       Component Value Date/Time   WBC 11.4* 10/19/2012 1423   WBC 12.7* 05/12/2012 1435   HGB 15.4* 10/19/2012 1423   HGB 15.8* 05/12/2012 1435   HCT 44.4 10/19/2012  1423   HCT 46.0 05/12/2012 1435   MCV 95.3 10/19/2012 1423   MCV 94.5 05/12/2012 1435    Lipid Panel     Component Value Date/Time   CHOL 173 04/06/2013 1333   TRIG 194* 04/06/2013 1333   HDL 44 04/06/2013 1333   CHOLHDL 3.9 04/06/2013 1333   VLDL 39 04/06/2013 1333   LDLCALC 90 04/06/2013 1333    ABG No results found for: PHART, PCO2ART, PO2ART, HCO3, TCO2, ACIDBASEDEF, O2SAT   Lab Results  Component Value Date   TSH 1.061 12/12/2013   BNP (last 3 results) No results for input(s): BNP in the last 8760 hours.  ProBNP (last 3 results) No results for input(s): PROBNP in the last 8760 hours.  Cardiac Panel (last 3 results) No results for input(s): CKTOTAL, CKMB, TROPONINI, RELINDX in the last 72 hours.  Iron/TIBC/Ferritin/ %Sat No results found for: IRON, TIBC, FERRITIN, IRONPCTSAT   EKG Orders placed or performed in visit on 09/11/13  . EKG 12-Lead     Prior Assessment and Plan Problem List as of  06/21/2014    Chest pain   Last Assessment & Plan 02/01/2014 Office Visit Written 02/01/2014  5:01 PM by Jodelle Gross, NP    Uncertain etiology, as she has been ruled out for CAD.  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  Some of this may be related to uncontrolled hypertension, versus high lung pressures.  Sleep study is ordered.  She will continue the nitrates as discussed above.      HTN (hypertension)   Last Assessment & Plan 02/01/2014 Office Visit Written 02/01/2014  5:00 PM by Jodelle Gross, NP    Her blood pressure remains difficult to control.  She does not tolerate the nitrates [place] do to recurrent headaches.  She is not taking and nitrates as directed as a result of this.  I advised her to take the medication with Tylenol, and follow with Tylenol every 6 hours until her body becomes use of this medication.  I have reassured her that the headache should get better.  In the interim, I will check a sleep study, to evaluate for sleep apnea contributing to difficult to control high blood pressure.  We will also change her verapamil from 80 mg twice a day to Calan XR 180  mg daily.would continue the calcium channel blocker, as this will help with angina symptoms.  We will see her in the office on followup to discuss sleep study results and to check her blood pressure.  If she is unable to tolerate the nitrates, demand to take her off of it and continue to adjust the calcium channel blocker.      Hyperlipidemia   Last Assessment & Plan 02/01/2014 Office Visit Written 02/01/2014  5:02 PM by Jodelle Gross, NP    Continue risk management.  Simvastatin, and fish oil.  Followup labs in 6 months.      Major depression   Last Assessment & Plan 08/28/2013 Office Visit Written 08/28/2013  3:13 PM by Jodelle Gross, NP    Followed by Behavioral Health          Imaging: No results found.

## 2014-07-04 ENCOUNTER — Ambulatory Visit (HOSPITAL_COMMUNITY): Payer: Self-pay | Admitting: Psychology

## 2014-07-05 ENCOUNTER — Telehealth (HOSPITAL_COMMUNITY): Payer: Self-pay | Admitting: *Deleted

## 2014-07-19 ENCOUNTER — Encounter (HOSPITAL_COMMUNITY): Payer: Self-pay | Admitting: Physical Medicine and Rehabilitation

## 2014-07-19 ENCOUNTER — Emergency Department (HOSPITAL_COMMUNITY): Payer: Self-pay

## 2014-07-19 ENCOUNTER — Emergency Department (HOSPITAL_COMMUNITY)
Admission: EM | Admit: 2014-07-19 | Discharge: 2014-07-19 | Disposition: A | Discharge status: Home / Self Care | Payer: Self-pay | Attending: Emergency Medicine | Admitting: Emergency Medicine

## 2014-07-19 DIAGNOSIS — I10 Essential (primary) hypertension: Secondary | ICD-10-CM | POA: Insufficient documentation

## 2014-07-19 DIAGNOSIS — E782 Mixed hyperlipidemia: Secondary | ICD-10-CM | POA: Insufficient documentation

## 2014-07-19 DIAGNOSIS — Y9389 Activity, other specified: Secondary | ICD-10-CM | POA: Insufficient documentation

## 2014-07-19 DIAGNOSIS — Z79899 Other long term (current) drug therapy: Secondary | ICD-10-CM | POA: Insufficient documentation

## 2014-07-19 DIAGNOSIS — Y9289 Other specified places as the place of occurrence of the external cause: Secondary | ICD-10-CM | POA: Insufficient documentation

## 2014-07-19 DIAGNOSIS — Z87891 Personal history of nicotine dependence: Secondary | ICD-10-CM | POA: Insufficient documentation

## 2014-07-19 DIAGNOSIS — F329 Major depressive disorder, single episode, unspecified: Secondary | ICD-10-CM | POA: Insufficient documentation

## 2014-07-19 DIAGNOSIS — X58XXXA Exposure to other specified factors, initial encounter: Secondary | ICD-10-CM | POA: Insufficient documentation

## 2014-07-19 DIAGNOSIS — Z8781 Personal history of (healed) traumatic fracture: Secondary | ICD-10-CM | POA: Insufficient documentation

## 2014-07-19 DIAGNOSIS — Y998 Other external cause status: Secondary | ICD-10-CM | POA: Insufficient documentation

## 2014-07-19 DIAGNOSIS — F419 Anxiety disorder, unspecified: Secondary | ICD-10-CM | POA: Insufficient documentation

## 2014-07-19 DIAGNOSIS — S20211A Contusion of right front wall of thorax, initial encounter: Secondary | ICD-10-CM | POA: Insufficient documentation

## 2014-07-19 LAB — CBC WITH DIFFERENTIAL/PLATELET
BASOS PCT: 0 % (ref 0–1)
Basophils Absolute: 0 10*3/uL (ref 0.0–0.1)
Eosinophils Absolute: 0.2 10*3/uL (ref 0.0–0.7)
Eosinophils Relative: 1 % (ref 0–5)
HCT: 46.4 % — ABNORMAL HIGH (ref 36.0–46.0)
Hemoglobin: 16 g/dL — ABNORMAL HIGH (ref 12.0–15.0)
Lymphocytes Relative: 25 % (ref 12–46)
Lymphs Abs: 3.5 10*3/uL (ref 0.7–4.0)
MCH: 32.9 pg (ref 26.0–34.0)
MCHC: 34.5 g/dL (ref 30.0–36.0)
MCV: 95.3 fL (ref 78.0–100.0)
Monocytes Absolute: 0.8 10*3/uL (ref 0.1–1.0)
Monocytes Relative: 6 % (ref 3–12)
NEUTROS ABS: 9.6 10*3/uL — AB (ref 1.7–7.7)
Neutrophils Relative %: 68 % (ref 43–77)
PLATELETS: 321 10*3/uL (ref 150–400)
RBC: 4.87 MIL/uL (ref 3.87–5.11)
RDW: 12.6 % (ref 11.5–15.5)
WBC: 14 10*3/uL — ABNORMAL HIGH (ref 4.0–10.5)

## 2014-07-19 LAB — I-STAT TROPONIN, ED: TROPONIN I, POC: 0 ng/mL (ref 0.00–0.08)

## 2014-07-19 LAB — COMPREHENSIVE METABOLIC PANEL
ALK PHOS: 74 U/L (ref 38–126)
ALT: 39 U/L (ref 14–54)
ANION GAP: 12 (ref 5–15)
AST: 29 U/L (ref 15–41)
Albumin: 3.9 g/dL (ref 3.5–5.0)
BUN: 11 mg/dL (ref 6–20)
CO2: 22 mmol/L (ref 22–32)
Calcium: 9.4 mg/dL (ref 8.9–10.3)
Chloride: 99 mmol/L — ABNORMAL LOW (ref 101–111)
Creatinine, Ser: 0.86 mg/dL (ref 0.44–1.00)
GFR calc Af Amer: >60 mL/min (ref >60)
GFR calc non Af Amer: >60 mL/min (ref >60)
Glucose, Bld: 132 mg/dL — ABNORMAL HIGH (ref 65–99)
Potassium: 4.6 mmol/L (ref 3.5–5.1)
SODIUM: 133 mmol/L — AB (ref 135–145)
Total Bilirubin: 0.5 mg/dL (ref 0.3–1.2)
Total Protein: 7.4 g/dL (ref 6.5–8.1)

## 2014-07-19 MED ORDER — ONDANSETRON 4 MG PO TBDP: ORAL_TABLET | ORAL | Status: DC

## 2014-07-19 MED ORDER — NAPROXEN 375 MG PO TABS: 375.0000 mg | ORAL_TABLET | Freq: Two times a day (BID) | ORAL | Status: DC

## 2014-07-19 MED ORDER — HYDROMORPHONE HCL 1 MG/ML IJ SOLN
1.0000 mg | Freq: Once | INTRAMUSCULAR | Status: AC
  Administered 2014-07-19: 1 mg via INTRAMUSCULAR
  Filled 2014-07-19: qty 1

## 2014-07-19 MED ORDER — HYDROCODONE-ACETAMINOPHEN 5-325 MG PO TABS: 1.0000 | ORAL_TABLET | ORAL | Status: DC | PRN

## 2014-07-19 MED ORDER — ONDANSETRON 4 MG PO TBDP
8.0000 mg | ORAL_TABLET | Freq: Once | ORAL | Status: AC
  Administered 2014-07-19: 8 mg via ORAL
  Filled 2014-07-19: qty 2

## 2014-07-19 NOTE — ED Notes (Signed)
Pt presents to department for evaluation of R sided rib pain. Onset after doing yardwork. 4/10 pain, increases with movement. Cardiac history. Pt is alert and oriented x4.

## 2014-07-19 NOTE — ED Provider Notes (Signed)
CSN: [id number]     Arrival date & time 07/19/14  1404 History   First MD Initiated Contact with Patient 07/19/14 1628     Chief Complaint  Patient presents with  . Chest Pain     (Consider location/radiation/quality/duration/timing/severity/associated sxs/prior Treatment) HPI Comments: Patient presents with right rib pain. She states she has a previous history of a rib fracture several months ago to this area. She states the week ago she was doing some yard work and felt some increased pain to her right ribs. She states it's been hurting on slice since that time. It hurts to move and hurts to breathe. She feels short of breath at times. She denies any leg pain or swelling. She denies any cough or chest congestion. She denies any fevers or chills. She's been taking Goody powders without relief in symptoms.  Patient is a 44 y.o. female presenting with chest pain.  Chest Pain Associated symptoms: no abdominal pain, no back pain, no cough, no diaphoresis, no dizziness, no fatigue, no fever, no headache, no nausea, no numbness, not vomiting and no weakness     Past Medical History  Diagnosis Date  . Hypertension   . High cholesterol   . Anxiety   . Depression    Past Surgical History  Procedure Laterality Date  . Cholecystectomy    . Elbow surgery  left   Family History  Problem Relation Age of Onset  . Hypertension Brother   . Bipolar disorder Brother   . Alcohol abuse Father    History  Substance Use Topics  . Smoking status: Former Smoker -- 0.50 packs/day for 35 years    Start date: 01/05/1984    Quit date: 01/05/2012  . Smokeless tobacco: Never Used  . Alcohol Use: No   OB History    Gravida Para Term Preterm AB TAB SAB Ectopic Multiple Living   3 1   2           Review of Systems  Constitutional: Negative for fever, chills, diaphoresis and fatigue.  HENT: Negative for congestion, rhinorrhea and sneezing.   Eyes: Negative.   Respiratory: Negative for cough and  chest tightness.   Cardiovascular: Positive for chest pain. Negative for leg swelling.  Gastrointestinal: Negative for nausea, vomiting, abdominal pain, diarrhea and blood in stool.  Genitourinary: Negative for frequency, hematuria, flank pain and difficulty urinating.  Musculoskeletal: Negative for back pain and arthralgias.  Skin: Negative for rash.  Neurological: Negative for dizziness, speech difficulty, weakness, numbness and headaches.      Allergies  Codeine and Tramadol  Home Medications   Prior to Admission medications   Medication Sig Start Date End Date Taking? Authorizing Provider  ALPRAZolam Prudy Feeler(XANAX) 1 MG tablet Take 1 tablet (1 mg total) by mouth 4 (four) times daily. 06/11/14   Myrlene Brokereborah R Ross, MD  Ca Carbonate-Mag Hydroxide (ROLAIDS PO) Take 2 tablets by mouth daily as needed (heartburn).    Historical Provider, MD  citalopram (CELEXA) 20 MG tablet Take 1 tablet (20 mg total) by mouth daily. 06/11/14 06/11/15  Myrlene Brokereborah R Ross, MD  hydrochlorothiazide (HYDRODIURIL) 25 MG tablet Take 1 tablet (25 mg total) by mouth daily. 06/12/14   Jodelle GrossKathryn M Lawrence, NP  HYDROcodone-acetaminophen (NORCO/VICODIN) 5-325 MG per tablet Take 1-2 tablets by mouth every 4 (four) hours as needed. 07/19/14   Rolan BuccoMelanie Ned Kakar, MD  isosorbide mononitrate (IMDUR) 30 MG 24 hr tablet Take 1 tablet (30 mg total) by mouth daily. 12/12/13   Laqueta LindenSuresh A Koneswaran, MD  lisinopril (PRINIVIL,ZESTRIL) 20 MG tablet Take 1 tab 2 times daily 12/12/13   Laqueta Linden, MD  mirtazapine (REMERON) 30 MG tablet Take 1 tablet (30 mg total) by mouth at bedtime. 06/11/14 06/11/15  Myrlene Broker, MD  naproxen (NAPROSYN) 375 MG tablet Take 1 tablet (375 mg total) by mouth 2 (two) times daily. 07/19/14   Rolan Bucco, MD  Omega-3 Fatty Acids (FISH OIL PO) Take by mouth daily.    Historical Provider, MD  simvastatin (ZOCOR) 20 MG tablet Take 1 tablet (20 mg total) by mouth at bedtime. 12/27/12   Laqueta Linden, MD  verapamil (CALAN SR)  180 MG CR tablet Take 1 tablet (180 mg total) by mouth at bedtime. 02/01/14   Jodelle Gross, NP   BP 102/85 mmHg  Pulse 90  Temp(Src) 97.8 F (36.6 C) (Oral)  Resp 16  Ht  (1.575 m)  Wt 168 lb (76.204 kg)  BMI 30.72 kg/m2  SpO2 96% Physical Exam  Constitutional: She is oriented to person, place, and time. She appears well-developed and well-nourished.  HENT:  Head: Normocephalic and atraumatic.  Eyes: Pupils are equal, round, and reactive to light.  Neck: Normal range of motion. Neck supple.  Cardiovascular: Normal rate, regular rhythm and normal heart sounds.   Pulmonary/Chest: Effort normal and breath sounds normal. No respiratory distress. She has no wheezes. She has no rales. She exhibits tenderness (Positive tenderness to the right anterior and lateral chest wall. No crepitus or deformity, no signs of external trauma).  Abdominal: Soft. Bowel sounds are normal. There is no tenderness. There is no rebound and no guarding.  Musculoskeletal: Normal range of motion. She exhibits no edema.  No edema or calf tenderness  Lymphadenopathy:    She has no cervical adenopathy.  Neurological: She is alert and oriented to person, place, and time.  Skin: Skin is warm and dry. No rash noted.  Psychiatric: She has a normal mood and affect.    ED Course  Procedures (including critical care time) Labs Review Labs Reviewed  CBC WITH DIFFERENTIAL/PLATELET - Abnormal; Notable for the following:    WBC 14.0 (*)    Hemoglobin 16.0 (*)    HCT 46.4 (*)    Neutro Abs 9.6 (*)    All other components within normal limits  COMPREHENSIVE METABOLIC PANEL - Abnormal; Notable for the following:    Sodium 133 (*)    Chloride 99 (*)    Glucose, Bld 132 (*)    All other components within normal limits  I-STAT TROPOININ, ED    Imaging Review Dg Chest 2 View  07/19/2014   CLINICAL DATA:  Right lower rib and chest pain for 1 week. Recent heavy lifting.  EXAM: CHEST  2 VIEW  COMPARISON:   09/16/2013  FINDINGS: Borderline enlargement of the cardiopericardial silhouette. Old right lateral fifth rib fracture, healed. No edema. No pleural effusion.  IMPRESSION: 1. No acute thoracic findings. 2. Mild enlargement of the cardiopericardial silhouette. 3. Please note that nondisplaced rib fractures can be occult on conventional radiography.   Electronically Signed   By: Gaylyn Rong M.D.   On: 07/19/2014 14:43     EKG Interpretation   Date/Time:  Friday July 19 2014 14:11:57 EDT Ventricular Rate:  89 PR Interval:  134 QRS Duration: 76 QT Interval:  364 QTC Calculation: 442 R Axis:   9 Text Interpretation:  Normal sinus rhythm Cannot rule out Anterior infarct  , age undetermined Abnormal ECG since last tracing no  significant change  Confirmed by Glennon Kopko  MD, Letisia Schwalb ([id number]) on 07/19/2014 4:56:57 PM      MDM   Final diagnoses:  Rib contusion, right, initial encounter    There is no evidence of definite rib fracture or pneumothorax. She was dispensed an incentive spirometer. She was given prescription for Naprosyn and Vicodin 15 tablets. She was encouraged to follow-up with her primary care physician. She was given a referral to the [facility] wellness [facility]. She was advised to return for any worsening symptoms.    Rolan Bucco, MD 07/19/14 [phone]

## 2014-07-19 NOTE — ED Notes (Signed)
Pt able to ambulate.  GCS 15.

## 2014-07-19 NOTE — Discharge Instructions (Signed)

## 2014-08-29 ENCOUNTER — Ambulatory Visit (INDEPENDENT_AMBULATORY_CARE_PROVIDER_SITE_OTHER): Payer: Self-pay | Admitting: Psychology

## 2014-08-29 ENCOUNTER — Encounter (HOSPITAL_COMMUNITY): Payer: Self-pay | Admitting: Psychology

## 2014-08-29 DIAGNOSIS — F411 Generalized anxiety disorder: Secondary | ICD-10-CM

## 2014-08-29 DIAGNOSIS — F331 Major depressive disorder, recurrent, moderate: Secondary | ICD-10-CM

## 2014-08-29 NOTE — Progress Notes (Signed)
PROGRESS NOTE  Patient:  Beth Sanford   DOB: 03-23-1970  MR Number: [id number]  Location: [facility] [address] [place] [address] Dept: [phone]  Start: 2 PM End: 3 PM  Provider/Observer:     Hershal Coria PSYD  Chief Complaint:      Chief Complaint  Patient presents with  . Anxiety  . Depression  . Stress    Reason For Service:     The patient was referred by Dr. Tenny Craw for psychotherapeutic interventions. The patient reports that she has had significant troubles with depression for many years. She reports that she had been seen Dr. Tiburcio Pea for psychiatric services as well as a counselor and like them both by Dr. Tiburcio Pea moved her office to [facility]. She then tried a new psychiatrist and did not particularly related to them very well and stopped seeing him. She is now return in and started seeing Dr. Tenny Craw. The patient reports that she's been without her medications for depression and anxiety for some time. The patient reports that she has started back on her psychotropic medications and has been feeling better. However, she reports that she continues to be "stressed out" and has recurrent panic attacks. The patient also significant medical issues including congestive heart disease and COPD. The patient reports that while she has been feeling better she has continued to have a lot of anxiety and depression. Reports that she is in the process of quitting smoking cigarettes and has only been using E. cigarette/vapor and is also trying to stop that as well. The patient reports that there continues to be a lot of stress between her and her long-time boyfriend and she reports that he has not been time for related to work very well at all.  Interventions Strategy:  Cognitive/behavioral psychotherapeutic interventions  Participation Level:   Active  Participation Quality:  Appropriate       Behavioral Observation:  Well Groomed, Alert, and Appropriate.   Current Psychosocial Factors: The patient reports that she has been diagnosed with sleep apnia.  However, she keeps getting denied for medicad and can not afford to pay for CPAP.  The patient reports that her mother will not help, even though she helps her brother out all the time.    Content of Session:   Review current symptoms and work on therapeutic interventions for issues related to recurrent depression as well as anxiety.  Current Status:   The patient reports that she continues to have angina and high BP.       Patient Progress:   Stable with improvement initially from psychotropic interventions.  Target Goals:   Target goals include reducing the intensity, severity, and duration of symptoms of depression including feelings of hopelessness and helplessness, social isolation, and withdrawal from others. The patient reports that she would also like to work specifically on reducing the frequency and intensity of her anxiety and panic-like symptoms  Last Reviewed:   08/29/2014  Goals Addressed Today:    Today we worked on Producer, television/film/video and strategies utilizing the cognitive approach around her interpretation of her situation in her life.  Impression/Diagnosis:   The patient has a long history of recurrent symptoms of depression without psychotic features. She also describes significant symptoms of anxiety and possible panic attack.  Diagnosis:    Axis I: Major depressive disorder, recurrent episode, moderate  Generalized anxiety disorder   Davy Faught R, PsyD

## 2014-09-10 ENCOUNTER — Institutional Professional Consult (permissible substitution): Payer: Self-pay | Admitting: Pulmonary Disease

## 2014-09-11 ENCOUNTER — Ambulatory Visit (INDEPENDENT_AMBULATORY_CARE_PROVIDER_SITE_OTHER): Payer: Self-pay | Admitting: Psychiatry

## 2014-09-11 ENCOUNTER — Encounter (HOSPITAL_COMMUNITY): Payer: Self-pay | Admitting: Psychiatry

## 2014-09-11 VITALS — BP 173/123 | HR 70 | Ht 62.0 in | Wt 168.4 lb

## 2014-09-11 DIAGNOSIS — F331 Major depressive disorder, recurrent, moderate: Secondary | ICD-10-CM

## 2014-09-11 DIAGNOSIS — F411 Generalized anxiety disorder: Secondary | ICD-10-CM

## 2014-09-11 DIAGNOSIS — F332 Major depressive disorder, recurrent severe without psychotic features: Secondary | ICD-10-CM

## 2014-09-11 MED ORDER — MIRTAZAPINE 30 MG PO TABS: 30.0000 mg | ORAL_TABLET | Freq: Every day | ORAL | Status: DC

## 2014-09-11 MED ORDER — CITALOPRAM HYDROBROMIDE 20 MG PO TABS: 20.0000 mg | ORAL_TABLET | Freq: Every day | ORAL | Status: DC

## 2014-09-11 MED ORDER — ALPRAZOLAM 1 MG PO TABS: 1.0000 mg | ORAL_TABLET | Freq: Four times a day (QID) | ORAL | Status: DC
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
## 2014-09-13 ENCOUNTER — Encounter: Payer: Self-pay | Admitting: Pulmonary Disease

## 2014-09-13 ENCOUNTER — Ambulatory Visit (INDEPENDENT_AMBULATORY_CARE_PROVIDER_SITE_OTHER): Payer: Self-pay | Admitting: Pulmonary Disease

## 2014-09-13 VITALS — BP 114/78 | HR 80 | Temp 98.7°F | Ht 62.0 in | Wt 173.2 lb

## 2014-09-13 DIAGNOSIS — Z9989 Dependence on other enabling machines and devices: Principal | ICD-10-CM

## 2014-09-13 DIAGNOSIS — G4733 Obstructive sleep apnea (adult) (pediatric): Secondary | ICD-10-CM

## 2014-09-13 HISTORY — DX: Obstructive sleep apnea (adult) (pediatric): G47.33

## 2014-09-13 NOTE — Progress Notes (Deleted)
   Subjective:    Patient ID: [name] Salay, female    DOB: 11/09/1970, 44 y.o.   MRN: [id number]  HPI    Review of Systems  Constitutional: Positive for fatigue and unexpected weight change. Negative for fever.  HENT: Negative for congestion, dental problem, ear pain, nosebleeds, postnasal drip, rhinorrhea, sinus pressure, sneezing, sore throat and trouble swallowing.   Eyes: Positive for itching. Negative for redness.  Respiratory: Positive for cough, chest tightness and shortness of breath. Negative for wheezing.   Cardiovascular: Positive for chest pain and palpitations. Negative for leg swelling.  Gastrointestinal: Negative for nausea and vomiting.  Genitourinary: Negative for dysuria.  Musculoskeletal: Negative for joint swelling.  Skin: Negative for rash.  Neurological: Positive for headaches.  Hematological: Does not bruise/bleed easily.  Psychiatric/Behavioral: Positive for dysphoric mood. The patient is nervous/anxious.        Objective:   Physical Exam        Assessment & Plan:

## 2014-09-13 NOTE — Patient Instructions (Signed)
Will arrange for CPAP set up Follow up in 2 months 

## 2014-09-13 NOTE — Progress Notes (Signed)
Chief Complaint  Patient presents with  . Sleep Consult    referred by Joni Reining, NP for OSA.  Pt had sleep study June 2016 at [facility].  C/o fatigue, daytime sleepiness, has trouble falling to sleep, waking up several times during the night.  Epworth Score 4.    History of Present Illness: Beth Sanford is a 44 y.o. female for evaluation of sleep problems.  She has a history of manic depression and hypertensive heart disease.  She has difficult with her sleep.  She has trouble falling asleep, staying asleep, and feeling sleepy during the day.  Her psychiatrist and cardiologist were concerned about her snoring and sleep issues.  She also reports waking up feeling like she can't breath.  She had a sleep study in June 2016 which showed moderate sleep apnea.  Her sleep pattern is random.  She goes to sleep at different times.  She has trouble falling asleep.  Once she is asleep she will wake up every few hours to use the bathroom.  She can less as long as she wants and still feels tired.  She sometimes doesn't get out of bed until 2 pm.  She is not using anything to help stay awake.  Sleep aide medications she has used before didn't help.  She denies sleep walking, sleep talking, bruxism, or nightmares.  There is no history of restless legs.  She denies sleep hallucinations, sleep paralysis, or cataplexy.  The Epworth score is 4 out of 24.  Tests: Echo 11/16/12 >> mod LVH, EF 60 to 65%, grade 1 diastolic dysfx PSG 06/14/14 >> AHI 17, SpO2 low 80%  Shemeca C Soy  has a past medical history of Hypertension; High cholesterol; Anxiety; and Depression.  Raevin C Kiss  has past surgical history that includes Cholecystectomy and Elbow surgery (left).  Prior to Admission medications   Medication Sig Start Date End Date Taking? Authorizing Provider  ALPRAZolam Prudy Feeler) 1 MG tablet Take 1 tablet (1 mg total) by mouth 4 (four) times daily. 09/11/14  Yes Myrlene Broker, MD  Ca Carbonate-Mag Hydroxide  (ROLAIDS PO) Take 2 tablets by mouth daily as needed (heartburn).   Yes Historical Provider, MD  citalopram (CELEXA) 20 MG tablet Take 1 tablet (20 mg total) by mouth daily. 09/11/14 09/11/15 Yes Myrlene Broker, MD  hydrochlorothiazide (HYDRODIURIL) 25 MG tablet Take 1 tablet (25 mg total) by mouth daily. 06/12/14  Yes Jodelle Gross, NP  isosorbide mononitrate (IMDUR) 30 MG 24 hr tablet Take 1 tablet (30 mg total) by mouth daily. Patient taking differently: Take 30 mg by mouth daily as needed.  12/12/13  Yes Laqueta Linden, MD  lisinopril (PRINIVIL,ZESTRIL) 20 MG tablet Take 1 tab 2 times daily 12/12/13  Yes Laqueta Linden, MD  mirtazapine (REMERON) 30 MG tablet Take 1 tablet (30 mg total) by mouth at bedtime. 09/11/14 09/11/15 Yes Myrlene Broker, MD  Omega-3 Fatty Acids (FISH OIL PO) Take 1 tablet by mouth daily.    Yes Historical Provider, MD  simvastatin (ZOCOR) 20 MG tablet Take 1 tablet (20 mg total) by mouth at bedtime. 12/27/12  Yes Laqueta Linden, MD  verapamil (CALAN SR) 180 MG CR tablet Take 1 tablet (180 mg total) by mouth at bedtime. 02/01/14  Yes Jodelle Gross, NP    Allergies  Allergen Reactions  . Codeine Itching and Nausea Only  . Tramadol Nausea Only    Her family history includes Alcohol abuse in her father; Bipolar disorder  in her brother; Emphysema in her maternal grandfather; Heart failure in her father; Hypertension in her brother.  She  reports that she quit smoking about 2 years ago. Her smoking use included Cigarettes. She started smoking about 30 years ago. She has a 17.5 pack-year smoking history. She has never used smokeless tobacco. She reports that she does not drink alcohol or use illicit drugs.  Review of Systems  Constitutional: Positive for fatigue and unexpected weight change. Negative for fever.  HENT: Negative for congestion, dental problem, ear pain, nosebleeds, postnasal drip, rhinorrhea, sinus pressure, sneezing, sore throat and trouble  swallowing.   Eyes: Positive for itching. Negative for redness.  Respiratory: Positive for cough, chest tightness and shortness of breath. Negative for wheezing.   Cardiovascular: Positive for chest pain and palpitations. Negative for leg swelling.  Gastrointestinal: Negative for nausea and vomiting.  Genitourinary: Negative for dysuria.  Musculoskeletal: Negative for joint swelling.  Skin: Negative for rash.  Neurological: Positive for headaches.  Hematological: Does not bruise/bleed easily.  Psychiatric/Behavioral: Positive for dysphoric mood. The patient is nervous/anxious.    Physical Exam: BP 114/78 mmHg  Pulse 80  Temp(Src) 98.7 F (37.1 C) (Oral)  Ht 5\' 2"  (1.575 m)  Wt 173 lb 3.2 oz (78.563 kg)  BMI 31.67 kg/m2  SpO2 96%  General - No distress ENT - No sinus tenderness, no oral exudate, no LAN, no thyromegaly, TM clear, pupils equal/reactive Cardiac - s1s2 regular, no murmur, pulses symmetric Chest - No wheeze/rales/dullness, good air entry, normal respiratory excursion Back - No focal tenderness Abd - Soft, non-tender, no organomegaly, + bowel sounds Ext - No edema Neuro - Normal strength, cranial nerves intact Skin - No rashes Psych - Normal mood, and behavior  Discussion: Her sleep study shows moderate obstructive sleep apnea.  I have reviewed the recent sleep study results with the patient.  We discussed how sleep apnea can affect various health problems including risks for hypertension, cardiovascular disease, and diabetes.  We also discussed how sleep disruption can increase risks for accident, such as while driving.  Weight loss as a means of improving sleep apnea was also reviewed.  Additional treatment options discussed were CPAP therapy, oral appliance, and surgical intervention.  Assessment/plan:  Obstructive sleep apnea. Plan: - will arrange for auto CPAP set up assuming she can get financial assistance for her supplies - she is applying for  medicaid  Obesity. Plan: - reviewed options to assist with weight loss  Memorial problems. Could be related to chronic sleep deprivation in setting of OSA. Plan: - re-assess after she is established on therapy for her sleep apnea   Coralyn Helling, M.D. Pager [phone]

## 2014-09-20 ENCOUNTER — Encounter (HOSPITAL_COMMUNITY): Payer: Self-pay | Admitting: Psychology

## 2014-09-20 ENCOUNTER — Ambulatory Visit (INDEPENDENT_AMBULATORY_CARE_PROVIDER_SITE_OTHER): Payer: Self-pay | Admitting: Psychology

## 2014-09-20 DIAGNOSIS — F411 Generalized anxiety disorder: Secondary | ICD-10-CM

## 2014-09-20 DIAGNOSIS — F331 Major depressive disorder, recurrent, moderate: Secondary | ICD-10-CM

## 2014-09-20 NOTE — Progress Notes (Signed)
PROGRESS NOTE  Patient:  Beth Sanford   DOB: 02/23/1970  MR Number: [id number]  Location: [facility] [address] [place] [address] Dept: [phone]  Start: 1 PM End: 2 PM  Daysean Tinkham/Observer:     Hershal Coria PSYD  Chief Complaint:      Chief Complaint  Patient presents with  . Depression  . Anxiety    Reason For Service:     The patient was referred by Dr. Tenny Craw for psychotherapeutic interventions. The patient reports that she has had significant troubles with depression for many years. She reports that she had been seen Dr. Tiburcio Pea for psychiatric services as well as a counselor and like them both by Dr. Tiburcio Pea moved her office to [facility]. She then tried a new psychiatrist and did not particularly related to them very well and stopped seeing him. She is now return in and started seeing Dr. Tenny Craw. The patient reports that she's been without her medications for depression and anxiety for some time. The patient reports that she has started back on her psychotropic medications and has been feeling better. However, she reports that she continues to be "stressed out" and has recurrent panic attacks. The patient also significant medical issues including congestive heart disease and COPD. The patient reports that while she has been feeling better she has continued to have a lot of anxiety and depression. Reports that she is in the process of quitting smoking cigarettes and has only been using E. cigarette/vapor and is also trying to stop that as well. The patient reports that there continues to be a lot of stress between her and her long-time boyfriend and she reports that he has not been time for related to work very well at all.  Interventions Strategy:  Cognitive/behavioral psychotherapeutic interventions  Participation Level:   Active  Participation Quality:  Appropriate       Behavioral Observation:  Well Groomed, Alert, and Appropriate.   Current Psychosocial Factors: The patient reports that her mother has agreed to help with the CPAP and [facility] is going to pick up everything bu 100 $.  The patient reports that her mood has been better with more visits from her daughter, getting help with CPAP and improving with mother.    Content of Session:   Review current symptoms and work on therapeutic interventions for issues related to recurrent depression as well as anxiety.  Current Status:   The patient reports that she continues to have angina and high BP but will be getting CPAP soon.  Her mood has improved, but sleep is still poor.       Patient Progress:   Stable with improvement initially from psychotropic interventions.  Target Goals:   Target goals include reducing the intensity, severity, and duration of symptoms of depression including feelings of hopelessness and helplessness, social isolation, and withdrawal from others. The patient reports that she would also like to work specifically on reducing the frequency and intensity of her anxiety and panic-like symptoms  Last Reviewed:   09/20/2014  Goals Addressed Today:    Today we worked on Producer, television/film/video and strategies utilizing the cognitive approach around her interpretation of her situation in her life.  Impression/Diagnosis:   The patient has a long history of recurrent symptoms of depression without psychotic features. She also describes significant symptoms of anxiety and possible panic attack.  Diagnosis:    Axis I: Major depressive disorder,  recurrent episode, moderate  Generalized anxiety disorder   RODENBOUGH,JOHN R, PsyD

## 2014-10-02 ENCOUNTER — Telehealth (HOSPITAL_COMMUNITY): Payer: Self-pay

## 2014-10-18 ENCOUNTER — Ambulatory Visit (HOSPITAL_COMMUNITY): Payer: Self-pay | Admitting: Psychology

## 2014-10-24 ENCOUNTER — Ambulatory Visit (HOSPITAL_COMMUNITY): Payer: Self-pay | Admitting: Psychology

## 2014-11-05 ENCOUNTER — Ambulatory Visit (INDEPENDENT_AMBULATORY_CARE_PROVIDER_SITE_OTHER): Payer: Self-pay | Admitting: Psychology

## 2014-11-05 ENCOUNTER — Encounter (HOSPITAL_COMMUNITY): Payer: Self-pay | Admitting: Psychology

## 2014-11-05 DIAGNOSIS — F331 Major depressive disorder, recurrent, moderate: Secondary | ICD-10-CM

## 2014-11-05 DIAGNOSIS — F411 Generalized anxiety disorder: Secondary | ICD-10-CM

## 2014-11-05 NOTE — Progress Notes (Signed)
PROGRESS NOTE  Patient:  Beth Sanford   DOB: 12/03/1970  MR Number: [id number]  Location: BEHAVIORAL [facility] [address] [place] [id number] Dept: [phone]  Start: 3 PM End: 4 PM  Provider/Observer:     Hershal CoriaJohn R Tayia Stonesifer PSYD  Chief Complaint:      Chief Complaint  Patient presents with  . Anxiety  . Depression    Reason For Service:     The patient was referred by Dr. Tenny Crawoss for psychotherapeutic interventions. The patient reports that she has had significant troubles with depression for many years. She reports that she had been seen Dr. Tiburcio PeaHarris for psychiatric services as well as a counselor and like them both by Dr. Tiburc[place] [facility]Harris [place]. She then tried a new psychiatrist and did not particularly related to them very well and stopped seeing him. She is now return in and started seeing Dr. Tenny Crawoss. The patient reports that she's been without her medications for depression and anxiety for some time. The patient reports that she has started back on her psychotropic medications and has been feeling better. However, she reports that she continues to be "stressed out" and has recurrent panic attacks. The patient also significant medical issues including congestive heart disease and COPD. The patient reports that while she has been feeling better she has continued to have a lot of anxiety and depression. Reports that she is in the process of quitting smoking cigarettes and has only been using E. cigarette/vapor and is also trying to stop that as well. The patient reports that there continues to be a lot of stress between her and her long-time boyfriend and she reports that he has not been time for related to work very well at all.  Interventions Strategy:  Cognitive/behavioral psychotherapeutic interventions  Participation Level:   Active  Participation Quality:  Appropriate       Behavioral Observation:  Well Groomed, Alert, and Appropriate.   Current Psychosocial Factors: The patient reports that she has her CPAP device and is already helping her feel better.  She is getting Sales executiveood Stamps again.  Doing much better.    Content of Session:   Review current symptoms and work on therapeutic interventions for issues related to recurrent depression as well as anxiety.  Current Status:   The patient reports that she continues to have angina and high BP and while it has not fallen down yet the hope is that it is going to fall down again.       Patient Progress:   Stable with improvement initially from psychotropic interventions.  Target Goals:   Target goals include reducing the intensity, severity, and duration of symptoms of depression including feelings of hopelessness and helplessness, social isolation, and withdrawal from others. The patient reports that she would also like to work specifically on reducing the frequency and intensity of her anxiety and panic-like symptoms  Last Reviewed:   11/05/2014  Goals Addressed Today:    Today we worked on Producer, television/film/videobuilding coping skills and strategies utilizing the cognitive approach around her interpretation of her situation in her life.  Impression/Diagnosis:   The patient has a long history of recurrent symptoms of depression without psychotic features. She also describes significant symptoms of anxiety and possible panic attack.  Diagnosis:    Axis I: Major depressive disorder, recurrent episode, moderate (HCC)  Generalized anxiety disorder   Niyam Bisping R, PsyD

## 2014-11-11 ENCOUNTER — Ambulatory Visit: Payer: Self-pay | Admitting: Pulmonary Disease

## 2014-12-04 ENCOUNTER — Emergency Department (HOSPITAL_COMMUNITY)
Admission: EM | Admit: 2014-12-04 | Discharge: 2014-12-04 | Disposition: A | Discharge status: Home / Self Care | Payer: Self-pay | Attending: Emergency Medicine | Admitting: Emergency Medicine

## 2014-12-04 DIAGNOSIS — M25521 Pain in right elbow: Secondary | ICD-10-CM | POA: Insufficient documentation

## 2014-12-04 DIAGNOSIS — I10 Essential (primary) hypertension: Secondary | ICD-10-CM | POA: Insufficient documentation

## 2014-12-04 DIAGNOSIS — N390 Urinary tract infection, site not specified: Secondary | ICD-10-CM | POA: Insufficient documentation

## 2014-12-04 DIAGNOSIS — F329 Major depressive disorder, single episode, unspecified: Secondary | ICD-10-CM | POA: Insufficient documentation

## 2014-12-04 DIAGNOSIS — E7800 Pure hypercholesterolemia, unspecified: Secondary | ICD-10-CM | POA: Insufficient documentation

## 2014-12-04 DIAGNOSIS — Z8669 Personal history of other diseases of the nervous system and sense organs: Secondary | ICD-10-CM | POA: Insufficient documentation

## 2014-12-04 DIAGNOSIS — Z79899 Other long term (current) drug therapy: Secondary | ICD-10-CM | POA: Insufficient documentation

## 2014-12-04 DIAGNOSIS — Z87891 Personal history of nicotine dependence: Secondary | ICD-10-CM | POA: Insufficient documentation

## 2014-12-04 DIAGNOSIS — F419 Anxiety disorder, unspecified: Secondary | ICD-10-CM | POA: Insufficient documentation

## 2014-12-04 LAB — URINALYSIS, ROUTINE W REFLEX MICROSCOPIC
Glucose, UA: NEGATIVE mg/dL
NITRITE: NEGATIVE
Specific Gravity, Urine: >1.03 — ABNORMAL HIGH (ref 1.005–1.030)
pH: 5 (ref 5.0–8.0)

## 2014-12-04 LAB — URINE MICROSCOPIC-ADD ON

## 2014-12-04 MED ORDER — ONDANSETRON HCL 4 MG PO TABS
4.0000 mg | ORAL_TABLET | Freq: Once | ORAL | Status: AC
  Administered 2014-12-04: 4 mg via ORAL
  Filled 2014-12-04: qty 1

## 2014-12-04 MED ORDER — PREDNISONE 50 MG PO TABS
60.0000 mg | ORAL_TABLET | Freq: Once | ORAL | Status: AC
  Administered 2014-12-04: 60 mg via ORAL
  Filled 2014-12-04: qty 1

## 2014-12-04 MED ORDER — KETOROLAC TROMETHAMINE 10 MG PO TABS
10.0000 mg | ORAL_TABLET | Freq: Once | ORAL | Status: AC
  Administered 2014-12-04: 10 mg via ORAL
  Filled 2014-12-04: qty 1

## 2014-12-04 MED ORDER — CEPHALEXIN 500 MG PO CAPS
500.0000 mg | ORAL_CAPSULE | Freq: Once | ORAL | Status: AC
  Administered 2014-12-04: 500 mg via ORAL
  Filled 2014-12-04: qty 1

## 2014-12-04 MED ORDER — DICLOFENAC SODIUM 75 MG PO TBEC: 75.0000 mg | DELAYED_RELEASE_TABLET | Freq: Two times a day (BID) | ORAL | Status: DC

## 2014-12-04 MED ORDER — DEXAMETHASONE 4 MG PO TABS
4.0000 mg | ORAL_TABLET | Freq: Two times a day (BID) | ORAL | Status: DC
Start: 2014-12-04 — End: 2014-12-11

## 2014-12-04 MED ORDER — CEPHALEXIN 500 MG PO CAPS: 500.0000 mg | ORAL_CAPSULE | Freq: Four times a day (QID) | ORAL | Status: DC

## 2014-12-04 NOTE — ED Provider Notes (Signed)
CSN: [id number]     Arrival date & time 12/04/14  1508 History   First MD Initiated Contact with Patient 12/04/14 1606     Chief Complaint  Patient presents with  . Elbow Pain  . Urinary Tract Infection     (Consider location/radiation/quality/duration/timing/severity/associated sxs/prior Treatment) HPI Comments: Pt reports pain of the right elbow for 3 days. It started getting sore during prep for the Thanksgiving holiday. She now has pain with bending and extending the elbow, and occasionally feels a pop in the elbow. No hot joint reported. No recent injury.  Patient is a 44 y.o. female presenting with frequency. The history is provided by the patient.  Urinary Frequency This is a new problem. The current episode started yesterday. The problem occurs intermittently. The problem has been gradually worsening. Associated symptoms include abdominal pain, arthralgias and nausea. Pertinent negatives include no chills or fever. Nothing aggravates the symptoms. She has tried nothing for the symptoms. The treatment provided no relief.    Past Medical History  Diagnosis Date  . Hypertension   . High cholesterol   . Anxiety   . Depression   . OSA (obstructive sleep apnea) 09/13/2014   Past Surgical History  Procedure Laterality Date  . Cholecystectomy    . Elbow surgery  left   Family History  Problem Relation Age of Onset  . Hypertension Brother   . Bipolar disorder Brother   . Alcohol abuse Father   . Emphysema Maternal Grandfather   . Heart failure Father     deceased at age 45   Social History  Substance Use Topics  . Smoking status: Former Smoker -- 0.50 packs/day for 35 years    Types: Cigarettes    Start date: 01/05/1984    Quit date: 01/05/2012  . Smokeless tobacco: Never Used  . Alcohol Use: No   OB History    Gravida Para Term Preterm AB TAB SAB Ectopic Multiple Living   3 1   2           Review of Systems  Constitutional: Negative for fever and chills.   Gastrointestinal: Positive for nausea and abdominal pain.  Genitourinary: Positive for frequency.  Musculoskeletal: Positive for arthralgias.  Psychiatric/Behavioral: The patient is nervous/anxious.   All other systems reviewed and are negative.     Allergies  Codeine and Tramadol  Home Medications   Prior to Admission medications   Medication Sig Start Date End Date Taking? Authorizing Provider  ALPRAZolam Prudy Feeler(XANAX) 1 MG tablet Take 1 tablet (1 mg total) by mouth 4 (four) times daily. 09/11/14   Myrlene Brokereborah R Ross, MD  Ca Carbonate-Mag Hydroxide (ROLAIDS PO) Take 2 tablets by mouth daily as needed (heartburn).    Historical Provider, MD  citalopram (CELEXA) 20 MG tablet Take 1 tablet (20 mg total) by mouth daily. 09/11/14 09/11/15  Myrlene Brokereborah R Ross, MD  hydrochlorothiazide (HYDRODIURIL) 25 MG tablet Take 1 tablet (25 mg total) by mouth daily. 06/12/14   Jodelle GrossKathryn M Lawrence, NP  isosorbide mononitrate (IMDUR) 30 MG 24 hr tablet Take 1 tablet (30 mg total) by mouth daily. Patient taking differently: Take 30 mg by mouth daily as needed.  12/12/13   Laqueta LindenSuresh A Koneswaran, MD  lisinopril (PRINIVIL,ZESTRIL) 20 MG tablet Take 1 tab 2 times daily 12/12/13   Laqueta LindenSuresh A Koneswaran, MD  mirtazapine (REMERON) 30 MG tablet Take 1 tablet (30 mg total) by mouth at bedtime. 09/11/14 09/11/15  Myrlene Brokereborah R Ross, MD  Omega-3 Fatty Acids (FISH OIL PO) Take 1  tablet by mouth daily.     Historical Provider, MD  simvastatin (ZOCOR) 20 MG tablet Take 1 tablet (20 mg total) by mouth at bedtime. 12/27/12   Laqueta Linden, MD  verapamil (CALAN SR) 180 MG CR tablet Take 1 tablet (180 mg total) by mouth at bedtime. 02/01/14   Jodelle Gross, NP   BP 164/116 mmHg  Pulse 102  Temp(Src) 97.8 F (36.6 C) (Oral)  Resp 18  Ht 5' 2.5" (1.588 m)  Wt 78.472 kg  BMI 31.12 kg/m2  SpO2 99%  LMP 11/07/2014 Physical Exam  Constitutional: She is oriented to person, place, and time. She appears well-developed and well-nourished.  Non-toxic  appearance.  HENT:  Head: Normocephalic.  Right Ear: Tympanic membrane and external ear normal.  Left Ear: Tympanic membrane and external ear normal.  Eyes: EOM and lids are normal. Pupils are equal, round, and reactive to light.  Neck: Normal range of motion. Neck supple. Carotid bruit is not present.  Cardiovascular: Normal rate, regular rhythm, normal heart sounds, intact distal pulses and normal pulses.   Pulmonary/Chest: Breath sounds normal. No respiratory distress.  Abdominal: Soft. Bowel sounds are normal. There is no tenderness. There is no guarding.  Lower abd pain at the suprapubic Area.   Musculoskeletal: Normal range of motion. She exhibits tenderness.  Mild to mod crepitus of the right elbow. No hot areas. No effusion. FROM of the right shoulder, wrist, and fingers.  Lymphadenopathy:       Head (right side): No submandibular adenopathy present.       Head (left side): No submandibular adenopathy present.    She has no cervical adenopathy.  Neurological: She is alert and oriented to person, place, and time. She has normal strength. No cranial nerve deficit or sensory deficit.  Skin: Skin is warm and dry.  Psychiatric: She has a normal mood and affect. Her speech is normal.  Nursing note and vitals reviewed.   ED Course  Procedures (including critical care time) Labs Review Labs Reviewed  URINALYSIS, ROUTINE W REFLEX MICROSCOPIC (NOT AT [facility]) - Abnormal; Notable for the following:    APPearance HAZY (*)    Specific Gravity, Urine >1.030 (*)    Hgb urine dipstick SMALL (*)    Bilirubin Urine SMALL (*)    Ketones, ur TRACE (*)    Protein, ur TRACE (*)    Leukocytes, UA SMALL (*)    All other components within normal limits  URINE MICROSCOPIC-ADD ON - Abnormal; Notable for the following:    Squamous Epithelial / LPF TOO NUMEROUS TO COUNT (*)    Bacteria, UA MANY (*)    Casts HYALINE CASTS (*)    All other components within normal limits    Imaging Review No  results found. I have personally reviewed and evaluated these images and lab results as part of my medical decision-making.   EKG Interpretation None      MDM Labs show UTI. Culture sent to the lab. Vital signs stable. No sign of Pyelonephritis. 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 No hot joint. Good ROM noted. Pt treated with keflex, decadron, and diclofenac.   Final diagnoses:  None    **I have reviewed nursing notes, vital signs, and all appropriate lab and imaging results for this patient.Beth Quale, Beth Sanford 12/04/14 1645  Beth Mulders, MD 12/05/14 [phone]

## 2014-12-04 NOTE — Discharge Instructions (Signed)
Please increase fluids and cranberry juice. Use keflex four times daily with meals and bedtime. Use decadron and diclofenac with food. Use the ACE bandage to the elbow until pain improves. Urinary Tract Infection A urinary tract infection (UTI) can occur any place along the urinary tract. The tract includes the kidneys, ureters, bladder, and urethra. A type of germ called bacteria often causes a UTI. UTIs are often helped with antibiotic medicine.  HOME CARE   If given, take antibiotics as told by your doctor. Finish them even if you start to feel better.  Drink enough fluids to keep your pee (urine) clear or pale yellow.  Avoid tea, drinks with caffeine, and bubbly (carbonated) drinks.  Pee often. Avoid holding your pee in for a long time.  Pee before and after having sex (intercourse).  Wipe from front to back after you poop (bowel movement) if you are a woman. Use each tissue only once. GET HELP RIGHT AWAY IF:   You have back pain.  You have lower belly (abdominal) pain.  You have chills.  You feel sick to your stomach (nauseous).  You throw up (vomit).  Your burning or discomfort with peeing does not go away.  You have a fever.  Your symptoms are not better in 3 days. MAKE SURE YOU:   Understand these instructions.  Will watch your condition.  Will get help right away if you are not doing well or get worse.   This information is not intended to replace advice given to you by your health care provider. Make sure you discuss any questions you have with your health care provider.   Document Released: 06/09/2007 Document Revised: 01/11/2014 Document Reviewed: 07/22/2011 Elsevier Interactive Patient Education 2016 Elsevier Inc.  Joint Pain Joint pain, which is also called arthralgia, can be caused by many things. Joint pain often goes away when you follow your health care provider's instructions for relieving pain at home. However, joint pain can also be caused by  conditions that require further treatment. Common causes of joint pain include:  Bruising in the area of the joint.  Overuse of the joint.  Wear and tear on the joints that occur with aging (osteoarthritis).  Various other forms of arthritis.  A buildup of a crystal form of uric acid in the joint (gout).  Infections of the joint (septic arthritis) or of the bone (osteomyelitis). Your health care provider may recommend medicine to help with the pain. If your joint pain continues, additional tests may be needed to diagnose your condition. HOME CARE INSTRUCTIONS Watch your condition for any changes. Follow these instructions as directed to lessen the pain that you are feeling.  Take medicines only as directed by your health care provider.  Rest the affected area for as long as your health care provider says that you should. If directed to do so, raise the painful joint above the level of your heart while you are sitting or lying down.  Do not do things that cause or worsen pain.  If directed, apply ice to the painful area:  Put ice in a plastic bag.  Place a towel between your skin and the bag.  Leave the ice on for 20 minutes, 2-3 times per day.  Wear an elastic bandage, splint, or sling as directed by your health care provider. Loosen the elastic bandage or splint if your fingers or toes become numb and tingle, or if they turn cold and blue.  Begin exercising or stretching the affected area as  directed by your health care provider. Ask your health care provider what types of exercise are safe for you.  Keep all follow-up visits as directed by your health care provider. This is important. SEEK MEDICAL CARE IF:  Your pain increases, and medicine does not help.  Your joint pain does not improve within 3 days.  You have increased bruising or swelling.  You have a fever.  You lose 10 lb (4.5 kg) or more without trying. SEEK IMMEDIATE MEDICAL CARE IF:  You are not able to  move the joint.  Your fingers or toes become numb or they turn cold and blue.   This information is not intended to replace advice given to you by your health care provider. Make sure you discuss any questions you have with your health care provider.   Document Released: 12/21/2004 Document Revised: 01/11/2014 Document Reviewed: 10/02/2013 Elsevier Interactive Patient Education [employer] Elsevier Inc.

## 2014-12-04 NOTE — ED Notes (Signed)
Pt states that she started having pain in her rt Elbow about 3 days ago - pain worstening and also making it difficult for her to lift anything up- Pt also believes she has a UTI  - Took BP meds this am

## 2014-12-07 LAB — URINE CULTURE: Culture: >=100000

## 2014-12-09 ENCOUNTER — Ambulatory Visit (INDEPENDENT_AMBULATORY_CARE_PROVIDER_SITE_OTHER): Payer: Self-pay | Admitting: Psychology

## 2014-12-09 ENCOUNTER — Telehealth: Payer: Self-pay | Admitting: *Deleted

## 2014-12-09 ENCOUNTER — Encounter (HOSPITAL_COMMUNITY): Payer: Self-pay | Admitting: Psychology

## 2014-12-09 DIAGNOSIS — F411 Generalized anxiety disorder: Secondary | ICD-10-CM

## 2014-12-09 DIAGNOSIS — F331 Major depressive disorder, recurrent, moderate: Secondary | ICD-10-CM

## 2014-12-09 NOTE — Progress Notes (Signed)
PROGRESS NOTE  Patient:  Beth MinerSheree C Platas   DOB: 11/10/1970  MR Number: [id number]  [facility] [address] [place] [address] Dept: [phone]  Start: 1 PM End: 2 PM  Provider/Observer:     Hershal CoriaJohn R Shanta Hartner PSYD  Chief Complaint:      Chief Complaint  Patient presents with  . Anxiety  . Depression    Reason For Service:     The patient was referred by Dr. Tenny Crawoss for psychotherapeutic interventions. The patient reports that she has had significant troubles with depression for many years. She reports that she had been seen Dr. Tiburcio PeaHarris for psychiatric services as well as a counselor and like them both by Dr. Tiburcio [facility]Harris [place] [facility] [place]. She then tried a new psychiatrist and did not particularly related to them very well and stopped seeing him. She is now return in and started seeing Dr. Tenny Crawoss. The patient reports that she's been without her medications for depression and anxiety for some time. The patient reports that she has started back on her psychotropic medications and has been feeling better. However, she reports that she continues to be "stressed out" and has recurrent panic attacks. The patient also significant medical issues including congestive heart disease and COPD. The patient reports that while she has been feeling better she has continued to have a lot of anxiety and depression. Reports that she is in the process of quitting smoking cigarettes and has only been using E. cigarette/vapor and is also trying to stop that as well. The patient reports that there continues to be a lot of stress between her and her long-time boyfriend and she reports that he has not been time for related to work very well at all.  Interventions Strategy:  Cognitive/behavioral psychotherapeutic interventions  Participation Level:   Active  Participation Quality:  Appropriate       Behavioral Observation:  Well Groomed, Alert, and Appropriate.   Current Psychosocial Factors: The patient reports that she has had a serious virial infection (cold/flu) then she got a sever UTI.  She has had significant reduction in relationship with mother.    Content of Session:   Review current symptoms and work on therapeutic interventions for issues related to recurrent depression as well as anxiety.  Current Status:   The patient reports that she continues to have angina and high BP and while it has not fallen down yet the hope is that it is going to fall down again.  She reports that her depression has improved.      Patient Progress:   Stable with improvement initially from psychotropic interventions.  Target Goals:   Target goals include reducing the intensity, severity, and duration of symptoms of depression including feelings of hopelessness and helplessness, social isolation, and withdrawal from others. The patient reports that she would also like to work specifically on reducing the frequency and intensity of her anxiety and panic-like symptoms  Last Reviewed:   12/09/2014  Goals Addressed Today:    Today we worked on Producer, television/film/videobuilding coping skills and strategies utilizing the cognitive approach around her interpretation of her situation in her life.  Impression/Diagnosis:   The patient has a long history of recurrent symptoms of depression without psychotic features. She also describes significant symptoms of anxiety and possible panic attack.  Diagnosis:    Axis I: Major depressive disorder, recurrent episode, moderate (HCC)  Generalized anxiety disorder  Hershal Coria, PsyD

## 2014-12-09 NOTE — ED Notes (Signed)
(+)  urine culture, treated with Cephalexin, OK per Felicita GageJ. Geiple, PA

## 2014-12-11 ENCOUNTER — Encounter (HOSPITAL_COMMUNITY): Payer: Self-pay | Admitting: Psychiatry

## 2014-12-11 ENCOUNTER — Ambulatory Visit (INDEPENDENT_AMBULATORY_CARE_PROVIDER_SITE_OTHER): Payer: Self-pay | Admitting: Psychiatry

## 2014-12-11 VITALS — BP 167/128 | HR 92 | Ht 62.5 in | Wt 168.0 lb

## 2014-12-11 DIAGNOSIS — F411 Generalized anxiety disorder: Secondary | ICD-10-CM

## 2014-12-11 DIAGNOSIS — F331 Major depressive disorder, recurrent, moderate: Secondary | ICD-10-CM

## 2014-12-11 MED ORDER — ALPRAZOLAM 1 MG PO TABS: 1.0000 mg | ORAL_TABLET | Freq: Four times a day (QID) | ORAL | Status: DC

## 2014-12-11 MED ORDER — CITALOPRAM HYDROBROMIDE 20 MG PO TABS: 20.0000 mg | ORAL_TABLET | Freq: Every day | ORAL | Status: DC

## 2014-12-11 MED ORDER — MIRTAZAPINE 30 MG PO TABS: 30.0000 mg | ORAL_TABLET | Freq: Every day | ORAL | Status: DC
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
## 2014-12-18 ENCOUNTER — Encounter: Payer: Self-pay | Admitting: Physician Assistant

## 2014-12-18 ENCOUNTER — Ambulatory Visit (INDEPENDENT_AMBULATORY_CARE_PROVIDER_SITE_OTHER): Payer: Self-pay | Admitting: Physician Assistant

## 2014-12-18 VITALS — BP 142/84 | HR 89 | Ht 62.5 in | Wt 167.8 lb

## 2014-12-18 DIAGNOSIS — Z720 Tobacco use: Secondary | ICD-10-CM | POA: Insufficient documentation

## 2014-12-18 MED ORDER — HYDRALAZINE HCL 25 MG PO TABS: 25.0000 mg | ORAL_TABLET | Freq: Three times a day (TID) | ORAL | Status: DC

## 2014-12-18 MED ORDER — VERAPAMIL HCL ER 240 MG PO TBCR: 240.0000 mg | EXTENDED_RELEASE_TABLET | Freq: Every day | ORAL | Status: DC

## 2014-12-18 NOTE — Addendum Note (Signed)
Addended by: Kerney ElbePINNIX, [place] G on: 12/18/2014 02:46 PM   Modules accepted: Orders, Medications

## 2014-12-18 NOTE — Assessment & Plan Note (Addendum)
Patient's blood pressure is stable today but it has been elevated over the past month. Will increase diltiazem to 240 mg daily, stop HCTZ, begin hydralazine 25 mg 3 times a day. She is to come back for blood pressure check in a couple weeks and follow-up with Joni ReiningKathryn Lawrence, NP in 2 months. I advised her that she should get a primary care doctor.

## 2014-12-18 NOTE — Progress Notes (Signed)
Cardiology Office Note   Date:  12/18/2014   ID:  Beth MinerSheree C Marcil, DOB 05/04/1970, MRN [id number]  PCP:  No PCP Per Patient  Cardiologist:  Dr. Purvis SheffieldKoneswaran  Chief Complaint: High blood pressure    History of Present Illness: Beth Sanford is a 44 y.o. female who presents for routine follow-up. She's had difficult to control hypertension and OSA on CPap. 2-D echo in 2014 moderate LVH EF 60-65% with grade 1 diastolic dysfunction. She also has history of atypical chest pain with normal stress echo 11/2012 and anxiety depression, hyperlipidemia and tobacco abuse.  Patient comes in today stating that her blood pressures been elevated recently. Retired she goes to and psychiatrists it's quite high. She was also in the emergency room for a UTI and bursitis and it was elevated then. Today it is fine. She thinks it's mostly due to stress. She has cut back on her sodium intake. She continues to smoke cigarettes. She is trying to get used to her new C Pap machine. She says it does help her.    Past Medical History  Diagnosis Date  . Hypertension   . High cholesterol   . Anxiety   . Depression   . OSA (obstructive sleep apnea) 09/13/2014    Past Surgical History  Procedure Laterality Date  . Cholecystectomy    . Elbow surgery  left     Current Outpatient Prescriptions  Medication Sig Dispense Refill  . ALPRAZolam (XANAX) 1 MG tablet Take 1 tablet (1 mg total) by mouth 4 (four) times daily. 120 tablet 2  . Ca Carbonate-Mag Hydroxide (ROLAIDS PO) Take 2 tablets by mouth daily as needed (heartburn).    . cephALEXin (KEFLEX) 500 MG capsule Take 1 capsule (500 mg total) by mouth 4 (four) times daily. 28 capsule 0  . citalopram (CELEXA) 20 MG tablet Take 1 tablet (20 mg total) by mouth daily. 30 tablet 2  . diclofenac (VOLTAREN) 75 MG EC tablet Take 1 tablet (75 mg total) by mouth 2 (two) times daily. 14 tablet 0  . hydrochlorothiazide (HYDRODIURIL) 25 MG tablet Take 1 tablet (25 mg total) by mouth  daily. 30 tablet 3  . isosorbide mononitrate (IMDUR) 30 MG 24 hr tablet Take 1 tablet (30 mg total) by mouth daily. (Patient taking differently: Take 30 mg by mouth daily as needed. ) 90 tablet 3  . lisinopril (PRINIVIL,ZESTRIL) 20 MG tablet Take 1 tab 2 times daily 30 tablet 3  . mirtazapine (REMERON) 30 MG tablet Take 1 tablet (30 mg total) by mouth at bedtime. 30 tablet 2  . Omega-3 Fatty Acids (FISH OIL PO) Take 1 tablet by mouth daily.     . simvastatin (ZOCOR) 20 MG tablet Take 1 tablet (20 mg total) by mouth at bedtime. 90 tablet 3  . verapamil (CALAN SR) 180 MG CR tablet Take 1 tablet (180 mg total) by mouth at bedtime. 90 tablet 3   No current facility-administered medications for this visit.    Allergies:   Codeine and Tramadol    Social History:  The patient  reports that she quit smoking about 2 years ago. Her smoking use included Cigarettes. She started smoking about 30 years ago. She has a 17.5 pack-year smoking history. She has never used smokeless tobacco. She reports that she does not drink alcohol or use illicit drugs.   Family History:  The patient's    family history includes Alcohol abuse in her father; Bipolar disorder in her brother; Emphysema in her maternal  grandfather; Heart failure in her father; Hypertension in her brother.    ROS:  Please see the history of present illness.   Otherwise, review of systems are positive for none.   All other systems are reviewed and negative.    PHYSICAL EXAM: VS:  LMP 11/07/2014 , BMI There is no weight on file to calculate BMI. GEN: Well nourished, well developed, in no acute distress Neck: no JVD, HJR, carotid bruits, or masses Cardiac: RRR; positive S4, no murmurs, rubs, thrill or heave,  Respiratory:  clear to auscultation bilaterally, normal work of breathing GI: soft, nontender, nondistended, + BS MS: no deformity or atrophy Extremities: without cyanosis, clubbing, edema, good distal pulses bilaterally.  Skin: warm and  dry, no rash Neuro:  Strength and sensation are intact    EKG:  EKG is not ordered today.    Recent Labs: 07/19/2014: ALT 39; BUN 11; Creatinine, Ser 0.86; Hemoglobin 16.0*; Platelets 321; Potassium 4.6; Sodium 133*    Lipid Panel    Component Value Date/Time   CHOL 173 04/06/2013 1333   TRIG 194* 04/06/2013 1333   HDL 44 04/06/2013 1333   CHOLHDL 3.9 04/06/2013 1333   VLDL 39 04/06/2013 1333   LDLCALC 90 04/06/2013 1333      Wt Readings from Last 3 Encounters:  12/11/14 168 lb (76.204 kg)  12/04/14 173 lb (78.472 kg)  09/13/14 173 lb 3.2 oz (78.563 kg)      Other studies Reviewed: Additional studies/ records that were reviewed today include and review of the records demonstrates:   Echo stress test 11/16/12 Impressions:  - No evidence of ischemia at workload achieved. Sensitivity   of study to detect ischemia is decreased, patient did not   reach her target heart rate. Correlate findings   clinically, if indicated consider pharmacological  Study Conclusions  - Study data: Technically adequate study. - Left ventricle: The cavity size was normal. Wall thickness   was increased in a pattern of moderate LVH. Systolic   function was normal. The estimated ejection fraction was   in the range of 60% to 65%. Wall motion was normal; there   were no regional wall motion abnormalities. Doppler   parameters are consistent with abnormal left ventricular   relaxation (grade 1 diastolic dysfunction). - Aortic valve: Moderately calcified annulus. Normal   thickness leaflets. Valve area: 2.36cm^2(VTI). Valve area:   2.04cm^2 (Vmax). - Aortic root: The aortic root was normal in size. - Mitral valve: Structurally normal valve. Transvalvular   velocity was within the normal range. There was no   evidence for stenosis. No regurgitation. - Left atrium: The atrium was normal in size. - Right ventricle: The cavity size was normal. Systolic   function was normal. RV TAPSE is 2.3  cm. - Tricuspid valve: Structurally normal valve. Transvalvular   velocity was within the normal range. No regurgitation. - Pulmonic valve: There was no evidence for stenosis. No   significant regurgitation. - Pulmonary arteries: Systolic pressure could not be   accurately estimated. Transthoracic echocardiography   ASSESSMENT AND PLAN:  Essential hypertension, malignant Patient's blood pressure is stable today but it has been elevated over the past month. Will increase diltiazem to 240 mg daily, stop HCTZ, begin hydralazine 25 mg 3 times a day. She is to come back for blood pressure check in a couple weeks and follow-up with Joni Reining, NP in 2 months. I advised her that she should get a primary care doctor.  Tobacco abuse Smoking cessation recommended.  Signed, Jacolyn Reedy, PA-C  12/18/2014 2:16 PM    [facility] [address] [place], [place], [place]  [address] Phone: [phone]; Fax: [phone]

## 2014-12-18 NOTE — Assessment & Plan Note (Signed)
Smoking cessation recommended 

## 2014-12-18 NOTE — Patient Instructions (Signed)
Your physician recommends that you schedule a follow-up appointment in: 2 Months with Joni ReiningKathryn Lawrence, NP  Your physician recommends that you schedule a follow-up appointment in: 2 weeks for Blood pressure check  Your physician has recommended you make the following change in your medication:   Stop Taking Hydrochlorothiazide  Increase Verapamil to 240 mg Daily  Start Taking Hydralazine 25 mg Three Times Daily  If you need a refill on your cardiac medications before your next appointment, please [facility] your pharmacy.  Thank you for choosing [facility]!

## 2014-12-23 ENCOUNTER — Telehealth: Payer: Self-pay | Admitting: Physician Assistant

## 2014-12-23 NOTE — Telephone Encounter (Signed)
Mother states pt is out trying to find a cell phone with minutes on it so she can [place] us back

## 2014-12-23 NOTE — Telephone Encounter (Signed)
Pt is confused on which of her BP medications she needs to be taking

## 2014-12-25 NOTE — Telephone Encounter (Signed)
I tried to reach pt on home & cell phones with no luck. I sent her a letter letting her know I will be happy to help her with any questions/problems she has once she contacts me.

## 2015-01-01 ENCOUNTER — Ambulatory Visit (INDEPENDENT_AMBULATORY_CARE_PROVIDER_SITE_OTHER): Payer: Self-pay | Admitting: *Deleted

## 2015-01-01 VITALS — BP 170/118 | HR 89

## 2015-01-01 DIAGNOSIS — I10 Essential (primary) hypertension: Secondary | ICD-10-CM

## 2015-01-01 NOTE — Progress Notes (Signed)
Patient states that she feels "fine". Denies feeling dizzy, or having a headache. Will forward to Dr. Wyline MoodBranch (DOD)

## 2015-01-01 NOTE — Patient Instructions (Addendum)
Your physician recommends that you schedule a follow-up appointment as directed.   Your physician has recommended you make the following change in your medication:   Restart Lisinopril 20 mg Two Times Daily  If you need a refill on your cardiac medications before your next appointment, please [facility] your pharmacy.  Thank you for choosing [facility]!

## 2015-01-09 ENCOUNTER — Encounter (HOSPITAL_COMMUNITY): Payer: Self-pay | Admitting: Psychology

## 2015-01-09 ENCOUNTER — Ambulatory Visit (INDEPENDENT_AMBULATORY_CARE_PROVIDER_SITE_OTHER): Payer: Self-pay | Admitting: Psychology

## 2015-01-09 DIAGNOSIS — F411 Generalized anxiety disorder: Secondary | ICD-10-CM

## 2015-01-09 DIAGNOSIS — F331 Major depressive disorder, recurrent, moderate: Secondary | ICD-10-CM

## 2015-01-09 NOTE — Progress Notes (Signed)
PROGRESS NOTE  Patient:  Beth MinerSheree C Caicedo   DOB: 06/12/1970  MR Number: [id number]  [facility]: [facility]- [address] [place] [address] Dept: [phone]  Start: 2 PM End: 3 PM  Provider/Observer:     Hershal CoriaJohn R Rodenbough PSYD  Chief Complaint:      Chief Complaint  Patient presents with  . Anxiety  . Depression    Reason For Service:     The patient was referred by Dr. Tenny Crawoss for psychotherapeutic interventions. The patient reports that she has had significant troubles with depression for many years. She reports that she had been seen Dr. Tiburcio PeaHarris for psychiatric services as well as a counselor and like them both by Dr. Tiburcio [facility]Harris [facility]. She then tried a new psychiatrist and did not particularly related to them very well and stopped seeing him. She is now return in and started seeing Dr. Tenny Crawoss. The patient reports that she's been without her medications for depression and anxiety for some time. The patient reports that she has started back on her psychotropic medications and has been feeling better. However, she reports that she continues to be "stressed out" and has recurrent panic attacks. The patient also significant medical issues including congestive heart disease and COPD. The patient reports that while she has been feeling better she has continued to have a lot of anxiety and depression. Reports that she is in the process of quitting smoking cigarettes and has only been using E. cigarette/vapor and is also trying to stop that as well. The patient reports that there continues to be a lot of stress between her and her long-time boyfriend and she reports that he has not been time for related to work very well at all.  Interventions Strategy:  Cognitive/behavioral psychotherapeutic interventions  Participation Level:   Active  Participation Quality:  Appropriate       Behavioral Observation:  Well Groomed, Alert, and Appropriate.   Current Psychosocial Factors: The patient reports that she has been doing better lately until her brother was convicted to shoplifting in [place].  He may be going away as long as 10 years.  .    Content of Session:   Review current symptoms and work on therapeutic interventions for issues related to recurrent depression as well as anxiety.  Current Status:   The patient reports that she is doing better with depression and anxeity, but her BP is very high and she is having trouble with CAP mask.  Working of new mask.      Patient Progress:   Stable with improvement initially from psychotropic interventions.  Target Goals:   Target goals include reducing the intensity, severity, and duration of symptoms of depression including feelings of hopelessness and helplessness, social isolation, and withdrawal from others. The patient reports that she would also like to work specifically on reducing the frequency and intensity of her anxiety and panic-like symptoms  Last Reviewed:   01/09/2015  Goals Addressed Today:    Today we worked on Producer, television/film/videobuilding coping skills and strategies utilizing the cognitive approach around her interpretation of her situation in her life.  Impression/Diagnosis:   The patient has a long history of recurrent symptoms of depression without psychotic features. She also describes significant symptoms of anxiety and possible panic attack.  Diagnosis:    Axis I: Major depressive disorder, recurrent episode, moderate (HCC)  Generalized anxiety disorder   RODENBOUGH,JOHN R, PsyD

## 2015-01-16 ENCOUNTER — Telehealth (HOSPITAL_COMMUNITY): Payer: Self-pay | Admitting: *Deleted

## 2015-01-16 NOTE — Telephone Encounter (Signed)
Pt called stating she received a a letter stating that she need to go to [employer] and she don't think she is mentally stable for it. Pt number is [phone]

## 2015-01-16 NOTE — Telephone Encounter (Signed)
She will need to bring the letter in so we can respond

## 2015-01-16 NOTE — Telephone Encounter (Signed)
Pt called stating she received a a letter stating that she need to go to Jury duty and she don't think she is mentally stable for it. Pt number is [phone] 

## 2015-01-16 NOTE — Telephone Encounter (Signed)
noted 

## 2015-01-20 ENCOUNTER — Telehealth: Payer: Self-pay | Admitting: Cardiovascular Disease

## 2015-01-20 MED ORDER — LISINOPRIL 20 MG PO TABS: ORAL_TABLET | ORAL | Status: DC

## 2015-01-20 NOTE — Telephone Encounter (Signed)
Patient has questions regarding medications and refills / tg

## 2015-01-20 NOTE — Telephone Encounter (Signed)
Pt requested refill for lisinopril to [facility] ,e-scribed there

## 2015-02-06 ENCOUNTER — Ambulatory Visit (INDEPENDENT_AMBULATORY_CARE_PROVIDER_SITE_OTHER): Payer: Self-pay | Admitting: Psychology

## 2015-02-06 DIAGNOSIS — F331 Major depressive disorder, recurrent, moderate: Secondary | ICD-10-CM

## 2015-02-06 DIAGNOSIS — F411 Generalized anxiety disorder: Secondary | ICD-10-CM

## 2015-02-10 ENCOUNTER — Ambulatory Visit (HOSPITAL_COMMUNITY): Payer: Self-pay | Admitting: Psychology

## 2015-02-18 ENCOUNTER — Ambulatory Visit (INDEPENDENT_AMBULATORY_CARE_PROVIDER_SITE_OTHER): Payer: Self-pay | Admitting: Adult Health

## 2015-02-18 ENCOUNTER — Encounter: Payer: Self-pay | Admitting: Adult Health

## 2015-02-18 VITALS — BP 180/104 | HR 87 | Ht 65.0 in | Wt 167.0 lb

## 2015-02-18 DIAGNOSIS — R079 Chest pain, unspecified: Secondary | ICD-10-CM

## 2015-02-18 DIAGNOSIS — I10 Essential (primary) hypertension: Secondary | ICD-10-CM

## 2015-02-18 MED ORDER — HYDROCHLOROTHIAZIDE 12.5 MG PO CAPS: ORAL_CAPSULE | ORAL | Status: DC

## 2015-02-18 MED ORDER — RANOLAZINE ER 500 MG PO TB12: 500.0000 mg | ORAL_TABLET | Freq: Two times a day (BID) | ORAL | Status: DC

## 2015-02-18 NOTE — Progress Notes (Signed)
Cardiology Office Note   Date:  02/18/2015   ID:  Beth Sanford, DOB [birth year], MRN [id number]  PCP:  No PCP Per Patient  Cardiologist: Inis Sizer, NP   Chief Complaint  Patient presents with  . Hypertension      History of Present Illness: Beth Sanford is a 45 y.o. female who presents for ongoing assessment and management of difficult to control hypertension, with other history to include anxiety, depression, and other psychological issues.  She was last seen in the office by Herma Carson who added, hydralazine 25 mg twice a day, and increase verapamil to 240 mg daily.the patient comes today speaking of still being under a lot of stress.  A friend of hers has died and she is getting ready to get to the funeral.  She also complains of headache with Imdur.  She states she is taking her medications as directed.  She also states she is now smoking x 3 years.  Past Medical History  Diagnosis Date  . Hypertension   . High cholesterol   . Anxiety   . Depression   . OSA (obstructive sleep apnea) 09/13/2014    Past Surgical History  Procedure Laterality Date  . Cholecystectomy    . Elbow surgery  left     Current Outpatient Prescriptions  Medication Sig Dispense Refill  . ALPRAZolam (XANAX) 1 MG tablet Take 1 tablet (1 mg total) by mouth 4 (four) times daily. 120 tablet 2  . Ca Carbonate-Mag Hydroxide (ROLAIDS PO) Take 2 tablets by mouth daily as needed (heartburn).    . citalopram (CELEXA) 20 MG tablet Take 1 tablet (20 mg total) by mouth daily. 30 tablet 2  . diclofenac (VOLTAREN) 75 MG EC tablet Take 1 tablet (75 mg total) by mouth 2 (two) times daily. 14 tablet 0  . hydrALAZINE (APRESOLINE) 25 MG tablet Take 1 tablet (25 mg total) by mouth 3 (three) times daily. 90 tablet 6  . lisinopril (PRINIVIL,ZESTRIL) 20 MG tablet Take 1 tab 2 times daily 90 tablet 3  . mirtazapine (REMERON) 30 MG tablet Take 1 tablet (30 mg total) by mouth at bedtime. 30 tablet 2  .  Omega-3 Fatty Acids (FISH OIL PO) Take 1 tablet by mouth daily.     . simvastatin (ZOCOR) 20 MG tablet Take 1 tablet (20 mg total) by mouth at bedtime. 90 tablet 3  . verapamil (CALAN-SR) 240 MG CR tablet Take 1 tablet (240 mg total) by mouth daily. 30 tablet 6  . hydrochlorothiazide (MICROZIDE) 12.5 MG capsule Take 12.5 mg daily AS NEEDED 90 capsule 3  . ranolazine (RANEXA) 500 MG 12 hr tablet Take 1 tablet (500 mg total) by mouth 2 (two) times daily. 60 tablet 6   No current facility-administered medications for this visit.    Allergies:   Codeine and Tramadol    Social History:  The patient  reports that she quit smoking about 3 years ago. Her smoking use included Cigarettes. She started smoking about 31 years ago. She has a 17.5 pack-year smoking history. She has never used smokeless tobacco. She reports that she does not drink alcohol or use illicit drugs.   Family History:  The patient's family history includes Alcohol abuse in her father; Bipolar disorder in her brother; Emphysema in her maternal grandfather; Heart failure in her father; Hypertension in her brother.    ROS: All other systems are reviewed and negative. Unless otherwise mentioned in H&P    PHYSICAL EXAM: VS:  BP  180/104 mmHg  Pulse 87  Ht  (1.651 m)  Wt 167 lb (75.751 kg)  BMI 27.79 kg/m2  SpO2 98% , BMI Body mass index is 27.79 kg/(m^2). GEN: Well nourished, well developed, in no acute distress HEENT: normal Neck: no JVD, carotid bruits, or masses Cardiac: RRR; no murmurs, rubs, or gallops,no edema  Respiratory:  Clear to auscultation bilaterally, normal work of breathing, raspy breathing in the upper throat area. GI: soft, nontender, nondistended, + BS MS: no deformity or atrophy Skin: warm and dry, no rash Neuro:  Strength and sensation are intact Psych: euthymic mood, full affect   Recent Labs: 07/19/2014: ALT 39; BUN 11; Creatinine, Ser 0.86; Hemoglobin 16.0*; Platelets 321; Potassium 4.6; Sodium  133*    Lipid Panel    Component Value Date/Time   CHOL 173 04/06/2013 1333   TRIG 194* 04/06/2013 1333   HDL 44 04/06/2013 1333   CHOLHDL 3.9 04/06/2013 1333   VLDL 39 04/06/2013 1333   LDLCALC 90 04/06/2013 1333      Wt Readings from Last 3 Encounters:  02/18/15 167 lb (75.751 kg)  12/18/14 167 lb 12.8 oz (76.114 kg)  12/11/14 168 lb (76.204 kg)     ASSESSMENT AND PLAN:  1. Difficult to control hypertension: blood pressure is elevated today despite going up on verapamil and the addition of hydralazine 25 mg daily.  I am uncertain that she is actually take the medications as directed.  She also complains a lot of stress and anxiety.  This may contributing to her current blood pressure.  I have advised her to take her medications as directed, although she states she is taking it.  I would expect her blood pressure to be much lower.  We will continue to monitor her response.  She is requesting that she have her diuretic back and she continues to have some evidence of fluid retention, which is probably related to the increased dose of calcium channel blocker.  I have given her a prescription for HCTZ 12.5 mg to use when necessary  .She has not had an echocardiogram since 2014.  Blood pressure being elevated consistently I would like to repeat her echocardiogram for changes in LV function, diastolic function, and pulmonary artery pressure in the setting of obstructive sleep apnea.  2. Chronic chest pain:she no longer wishes to take the Imdur as she states it gives her significant headache.  She thinks that she was only supposed to take it as needed.  I have explained to her that this is a daily medication.  She does not wish to continue taking it.  I changed her to her next set 500 mg twice a day, and have given her samples.  A new Rx is provided.  3. Ongoing anxiety and depression:she is to continue with behavioral therapy.   Current medicines are reviewed at length with the patient  today.      Orders Placed This Encounter  Procedures  . Echocardiogram     Disposition:   FU with 6 months with Dr. Purvis Sheffield (has not been seen by cardiologist since December of 2015)  Signed, Joni Reining, NP  02/18/2015 2:29 PM    [facility] [address] [place], [place] [address] Phone: [phone]; Fax: [phone]

## 2015-02-18 NOTE — Progress Notes (Deleted)
Name: Beth Sanford    DOB: January 02, 1971  Age: 45 y.o.  MR#: [id number]       PCP:  No PCP Per Patient      Insurance: Payor: MED PAY / Plan: MED PAY ASSURANCE / Product Type: *No Product type* /   CC:   No chief complaint on file.   VS Filed Vitals:   02/18/15 1401  BP: 180/104  Pulse: 87  Height: 5\' 5"  (1.651 m)  Weight: 167 lb (75.751 kg)  SpO2: 98%    Weights Current Weight  02/18/15 167 lb (75.751 kg)  12/18/14 167 lb 12.8 oz (76.114 kg)  12/11/14 168 lb (76.204 kg)    Blood Pressure  BP Readings from Last 3 Encounters:  02/18/15 180/104  01/01/15 170/118  12/18/14 142/84     Admit date:  (Not on file) Last encounter with RMR:  Visit date not found   Allergy Codeine and Tramadol  Current Outpatient Prescriptions  Medication Sig Dispense Refill  . ALPRAZolam (XANAX) 1 MG tablet Take 1 tablet (1 mg total) by mouth 4 (four) times daily. 120 tablet 2  . Ca Carbonate-Mag Hydroxide (ROLAIDS PO) Take 2 tablets by mouth daily as needed (heartburn).    . citalopram (CELEXA) 20 MG tablet Take 1 tablet (20 mg total) by mouth daily. 30 tablet 2  . diclofenac (VOLTAREN) 75 MG EC tablet Take 1 tablet (75 mg total) by mouth 2 (two) times daily. 14 tablet 0  . hydrALAZINE (APRESOLINE) 25 MG tablet Take 1 tablet (25 mg total) by mouth 3 (three) times daily. 90 tablet 6  . isosorbide mononitrate (IMDUR) 30 MG 24 hr tablet Take 1 tablet (30 mg total) by mouth daily. (Patient taking differently: Take 30 mg by mouth daily as needed. ) 90 tablet 3  . lisinopril (PRINIVIL,ZESTRIL) 20 MG tablet Take 1 tab 2 times daily 90 tablet 3  . mirtazapine (REMERON) 30 MG tablet Take 1 tablet (30 mg total) by mouth at bedtime. 30 tablet 2  . Omega-3 Fatty Acids (FISH OIL PO) Take 1 tablet by mouth daily.     . simvastatin (ZOCOR) 20 MG tablet Take 1 tablet (20 mg total) by mouth at bedtime. 90 tablet 3  . verapamil (CALAN-SR) 240 MG CR tablet Take 1 tablet (240 mg total) by mouth daily. 30 tablet 6    No current facility-administered medications for this visit.    Discontinued Meds:    Medications Discontinued During This Encounter  Medication Reason  . cephALEXin (KEFLEX) 500 MG capsule Error    Patient Active Problem List   Diagnosis Date Noted  . Tobacco abuse 12/18/2014  . OSA (obstructive sleep apnea) 09/13/2014  . Major depression (HCC) 06/08/2013  . Chest pain 11/01/2012  . Essential hypertension, malignant 11/01/2012  . Hyperlipidemia 11/01/2012    LABS    Component Value Date/Time   NA 133* 07/19/2014 1340   NA 133* 10/19/2012 1423   K 4.6 07/19/2014 1340   K 3.8 10/19/2012 1423   CL 99* 07/19/2014 1340   CL 100 10/19/2012 1423   CO2 22 07/19/2014 1340   CO2 19 10/19/2012 1423   GLUCOSE 132* 07/19/2014 1340   GLUCOSE 122* 10/19/2012 1423   BUN 11 07/19/2014 1340   BUN 5* 10/19/2012 1423   CREATININE 0.86 07/19/2014 1340   CREATININE 0.60 10/19/2012 1423   CALCIUM 9.4 07/19/2014 1340   CALCIUM 8.8 10/19/2012 1423   GFRNONAA >60 07/19/2014 1340   GFRNONAA >90 10/19/2012 1423  GFRAA >60 07/19/2014 1340   GFRAA >90 10/19/2012 1423   CMP     Component Value Date/Time   NA 133* 07/19/2014 1340   K 4.6 07/19/2014 1340   CL 99* 07/19/2014 1340   CO2 22 07/19/2014 1340   GLUCOSE 132* 07/19/2014 1340   BUN 11 07/19/2014 1340   CREATININE 0.86 07/19/2014 1340   CALCIUM 9.4 07/19/2014 1340   PROT 7.4 07/19/2014 1340   ALBUMIN 3.9 07/19/2014 1340   AST 29 07/19/2014 1340   ALT 39 07/19/2014 1340   ALKPHOS 74 07/19/2014 1340   BILITOT 0.5 07/19/2014 1340   GFRNONAA >60 07/19/2014 1340   GFRAA >60 07/19/2014 1340       Component Value Date/Time   WBC 14.0* 07/19/2014 1340   WBC 11.4* 10/19/2012 1423   WBC 12.7* 05/12/2012 1435   HGB 16.0* 07/19/2014 1340   HGB 15.4* 10/19/2012 1423   HGB 15.8* 05/12/2012 1435   HCT 46.4* 07/19/2014 1340   HCT 44.4 10/19/2012 1423   HCT 46.0 05/12/2012 1435   MCV 95.3 07/19/2014 1340   MCV 95.3 10/19/2012  1423   MCV 94.5 05/12/2012 1435    Lipid Panel     Component Value Date/Time   CHOL 173 04/06/2013 1333   TRIG 194* 04/06/2013 1333   HDL 44 04/06/2013 1333   CHOLHDL 3.9 04/06/2013 1333   VLDL 39 04/06/2013 1333   LDLCALC 90 04/06/2013 1333    ABG No results found for: PHART, PCO2ART, PO2ART, HCO3, TCO2, ACIDBASEDEF, O2SAT   Lab Results  Component Value Date   TSH 1.061 12/12/2013   BNP (last 3 results) No results for input(s): BNP in the last 8760 hours.  ProBNP (last 3 results) No results for input(s): PROBNP in the last 8760 hours.  Cardiac Panel (last 3 results) No results for input(s): CKTOTAL, CKMB, TROPONINI, RELINDX in the last 72 hours.  Iron/TIBC/Ferritin/ %Sat No results found for: IRON, TIBC, FERRITIN, IRONPCTSAT   EKG Orders placed or performed during the hospital encounter of 07/19/14  . EKG 12-Lead  . EKG 12-Lead  . ED EKG  . ED EKG  . EKG     Prior Assessment and Plan Problem List as of 02/18/2015      Cardiovascular and Mediastinum   Essential hypertension, malignant   Last Assessment & Plan 12/18/2014 Office Visit Edited 12/18/2014  2:36 PM by Dyann Kief, PA-C    Patient's blood pressure is stable today but it has been elevated over the past month. Will increase diltiazem to 240 mg daily, stop HCTZ, begin hydralazine 25 mg 3 times a day. She is to come back for blood pressure check in a couple weeks and follow-up with Joni Reining, NP in 2 months. I advised her that she should get a primary care doctor.        Respiratory   OSA (obstructive sleep apnea)     Other   Chest pain   Last Assessment & Plan 02/01/2014 Office Visit Written 02/01/2014  5:01 PM by Jodelle Gross, NP    Uncertain etiology, as she has been ruled out for CAD.  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  Some of this may be related to uncontrolled hypertension, versus high lung pressures.  Sleep study is ordered.  She will continue the nitrates as discussed above.       Hyperlipidemia   Last Assessment & Plan 02/01/2014 Office Visit Written 02/01/2014  5:02 PM by Jodelle Gross, NP    Continue risk  management.  Simvastatin, and fish oil.  Followup labs in 6 months.      Major depression [facility])   Last Assessment & Plan 08/28/2013 Office Visit Written 08/28/2013  3:13 PM by Jodelle Gross, NP    Followed by Behavioral Health      Tobacco abuse   Last Assessment & Plan 12/18/2014 Office Visit Written 12/18/2014  2:36 PM by Dyann Kief, PA-C    Smoking cessation recommended.          Imaging: No results found.

## 2015-02-18 NOTE — Patient Instructions (Signed)
Medication Instructions:  STOP IMDUR START RANEXA 500 MG TWO TIMES DAILY ( YOU HAVE BEEN GIVEN SAMPLES TODAY) TAKE HYDRALAZINE 25 MG THREE TIMES DAILY START HCTZ 12.5 MG DAILY AS NEEDED   Labwork: NONE  Testing/Procedures: Your physician has requested that you have an echocardiogram. Echocardiography is a painless test that uses sound waves to create images of your heart. It provides your doctor with information about the size and shape of your heart and how well your heart's chambers and valves are working. This procedure takes approximately one hour. There are no restrictions for this procedure.    Follow-Up: Your physician wants you to follow-up in: 6 MONTHS WITH DR. Purvis Sheffield.  You will receive a reminder letter in the mail two months in advance. If you don't receive a letter, please [place] our office to schedule the follow-up appointment.    Any Other Special Instructions Will Be Listed Below (If Applicable).     If you need a refill on your cardiac medications before your next appointment, please [place] your pharmacy.

## 2015-02-20 ENCOUNTER — Ambulatory Visit (HOSPITAL_COMMUNITY)
Admission: RE | Admit: 2015-02-20 | Discharge: 2015-02-20 | Disposition: A | Discharge status: Home / Self Care | Payer: Self-pay | Source: Ambulatory Visit | Attending: Adult Health | Admitting: Adult Health

## 2015-02-20 DIAGNOSIS — I10 Essential (primary) hypertension: Secondary | ICD-10-CM | POA: Insufficient documentation

## 2015-03-06 ENCOUNTER — Ambulatory Visit (INDEPENDENT_AMBULATORY_CARE_PROVIDER_SITE_OTHER): Payer: Self-pay | Admitting: Psychology

## 2015-03-06 DIAGNOSIS — F411 Generalized anxiety disorder: Secondary | ICD-10-CM

## 2015-03-06 DIAGNOSIS — F331 Major depressive disorder, recurrent, moderate: Secondary | ICD-10-CM

## 2015-03-10 ENCOUNTER — Telehealth (HOSPITAL_COMMUNITY): Payer: Self-pay | Admitting: *Deleted

## 2015-03-10 NOTE — Telephone Encounter (Signed)
Patient need refill on all medications Celexa and Xanax.

## 2015-03-11 ENCOUNTER — Ambulatory Visit (HOSPITAL_COMMUNITY): Payer: Self-pay | Admitting: Psychiatry

## 2015-03-11 ENCOUNTER — Telehealth (HOSPITAL_COMMUNITY): Payer: Self-pay | Admitting: *Deleted

## 2015-03-11 NOTE — Telephone Encounter (Signed)
lmtcb number provided 

## 2015-03-11 NOTE — Telephone Encounter (Signed)
Spoke with pt and message was sent to provider 

## 2015-03-11 NOTE — Telephone Encounter (Signed)
Called pt back due to previous [place]. Per pt, she is out of refills for her Celexa and Xanax. Both medication was refilled on 12-11-14 which was the last time pt was seen. Pt f.u appt was scheduled for 03-11-15 but provider was out of office. Pt appt is scheduled for 03-26-15. Pt number is [phone].

## 2015-03-11 NOTE — Telephone Encounter (Signed)
lmtcb

## 2015-03-12 ENCOUNTER — Telehealth (HOSPITAL_COMMUNITY): Payer: Self-pay | Admitting: *Deleted

## 2015-03-12 ENCOUNTER — Other Ambulatory Visit (HOSPITAL_COMMUNITY): Payer: Self-pay | Admitting: Psychiatry

## 2015-03-12 DIAGNOSIS — F331 Major depressive disorder, recurrent, moderate: Secondary | ICD-10-CM

## 2015-03-12 MED ORDER — CITALOPRAM HYDROBROMIDE 20 MG PO TABS: 20.0000 mg | ORAL_TABLET | Freq: Every day | ORAL | Status: DC

## 2015-03-12 MED ORDER — ALPRAZOLAM 1 MG PO TABS: 1.0000 mg | ORAL_TABLET | Freq: Four times a day (QID) | ORAL | Status: DC

## 2015-03-12 NOTE — Telephone Encounter (Signed)
Called in pt medication and spoke with Lorin PicketScott

## 2015-03-12 NOTE — Telephone Encounter (Signed)
celexa sent. You may [place] in one month supply of xanax

## 2015-03-12 NOTE — Telephone Encounter (Signed)
Per Dr. Tenny Crawoss to [place] in one month supply of pt Xanax to her pharmacy. Called pt pharmacy [facility] and spoke with [place] and they stated the last time they filled pt Xanax was March of 2016. Called pt to verify pharmacy and she stated she uses [facility]. Called [facility] and spoke with Lorin PicketScott and he stated he will get that ready for pt. Pt is aware.

## 2015-03-17 ENCOUNTER — Encounter: Payer: Self-pay | Admitting: Obstetrics and Gynecology

## 2015-03-17 ENCOUNTER — Telehealth (HOSPITAL_COMMUNITY): Payer: Self-pay | Admitting: *Deleted

## 2015-03-17 NOTE — Telephone Encounter (Signed)
this is her friend's phone.  she need some forms from you to re-certify for her apartment.

## 2015-03-20 ENCOUNTER — Telehealth (HOSPITAL_COMMUNITY): Payer: Self-pay | Admitting: *Deleted

## 2015-03-26 ENCOUNTER — Encounter (HOSPITAL_COMMUNITY): Payer: Self-pay | Admitting: Psychiatry

## 2015-03-26 ENCOUNTER — Ambulatory Visit (INDEPENDENT_AMBULATORY_CARE_PROVIDER_SITE_OTHER): Payer: Self-pay | Admitting: Psychiatry

## 2015-03-26 VITALS — BP 176/124 | HR 80 | Ht 65.0 in | Wt 168.0 lb

## 2015-03-26 DIAGNOSIS — F331 Major depressive disorder, recurrent, moderate: Secondary | ICD-10-CM

## 2015-03-26 DIAGNOSIS — F411 Generalized anxiety disorder: Secondary | ICD-10-CM

## 2015-03-26 MED ORDER — MIRTAZAPINE 30 MG PO TABS: 30.0000 mg | ORAL_TABLET | Freq: Every day | ORAL | Status: DC

## 2015-03-26 MED ORDER — ALPRAZOLAM 1 MG PO TABS: 1.0000 mg | ORAL_TABLET | Freq: Four times a day (QID) | ORAL | Status: DC

## 2015-03-26 MED ORDER — CITALOPRAM HYDROBROMIDE 20 MG PO TABS: 20.0000 mg | ORAL_TABLET | Freq: Every day | ORAL | Status: DC
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
## 2015-04-07 ENCOUNTER — Encounter (HOSPITAL_COMMUNITY): Payer: Self-pay | Admitting: Psychology

## 2015-04-07 ENCOUNTER — Ambulatory Visit (INDEPENDENT_AMBULATORY_CARE_PROVIDER_SITE_OTHER): Payer: Self-pay | Admitting: Psychology

## 2015-04-07 DIAGNOSIS — F331 Major depressive disorder, recurrent, moderate: Secondary | ICD-10-CM

## 2015-04-07 DIAGNOSIS — F411 Generalized anxiety disorder: Secondary | ICD-10-CM
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
## 2015-04-28 ENCOUNTER — Encounter (HOSPITAL_COMMUNITY): Payer: Self-pay | Admitting: Psychology

## 2015-04-28 NOTE — Progress Notes (Signed)
PROGRESS NOTE  Patient:  Beth Sanford   DOB: 06-23-1970  MR Number: [id number]  Location: BEHAVIORAL [facility] [address] [place] [address] Dept: [phone]  Start: 2 PM End: 3 PM  Provider/Observer:     Hershal Coria PSYD  Chief Complaint:      Chief Complaint  Patient presents with  . Anxiety  . Depression  . Stress  . Trauma    Reason For Service:     The patient was referred by Dr. Tenny Craw for psychotherapeutic interventions. The patient reports that she has had significant troubles with depression for many years. She reports that she had been seen Dr. Tiburcio Pea for psychiatric services as well as a counselor and like them both by Dr. Tiburcio Pea moved her office to [facility]. She then tried a new psychiatrist and did not particularly related to them very well and stopped seeing him. She is now return in and started seeing Dr. Tenny Craw. The patient reports that she's been without her medications for depression and anxiety for some time. The patient reports that she has started back on her psychotropic medications and has been feeling better. However, she reports that she continues to be "stressed out" and has recurrent panic attacks. The patient also significant medical issues including congestive heart disease and COPD. The patient reports that while she has been feeling better she has continued to have a lot of anxiety and depression. Reports that she is in the process of quitting smoking cigarettes and has only been using E. cigarette/vapor and is also trying to stop that as well. The patient reports that there continues to be a lot of stress between her and her long-time boyfriend and she reports that he has not been time for related to work very well at all.  Interventions Strategy:  Cognitive/behavioral psychotherapeutic interventions  Participation Level:   Active  Participation  Quality:  Appropriate      Behavioral Observation:  Well Groomed, Alert, and Appropriate.   Current Psychosocial Factors: The patient  Continues to report significant difficulties between her and her mother. She reports that there has been some progress now that her brother is having all of his legal difficulties and the patient is essentially needed by her mother to take care of more things around the mother's house.    Content of Session:   Review current symptoms and work on therapeutic interventions for issues related to recurrent depression as well as anxiety.  Current Status:   The patient reports that she is doing better with depression and anxeity, but her BP is very high and she is having trouble with CAP mask.  Working of new mask.      Patient Progress:   Stable with improvement initially from psychotropic interventions.  Target Goals:   Target goals include reducing the intensity, severity, and duration of symptoms of depression including feelings of hopelessness and helplessness, social isolation, and withdrawal from others. The patient reports that she would also like to work specifically on reducing the frequency and intensity of her anxiety and panic-like symptoms  Last Reviewed:   02/06/2015  Goals Addressed Today:    Today we worked on Producer, television/film/video and strategies utilizing the cognitive approach around her interpretation of her situation in her life.  Impression/Diagnosis:   The patient has a long history of recurrent symptoms of depression without psychotic features. She also describes significant symptoms of anxiety and possible  panic attack.  Diagnosis:    Axis I: Major depressive disorder, recurrent episode, moderate (HCC)  Generalized anxiety disorder   RODENBOUGH,JOHN R, PsyD

## 2015-05-07 ENCOUNTER — Ambulatory Visit (INDEPENDENT_AMBULATORY_CARE_PROVIDER_SITE_OTHER): Payer: Self-pay | Admitting: Psychology

## 2015-05-07 DIAGNOSIS — F331 Major depressive disorder, recurrent, moderate: Secondary | ICD-10-CM

## 2015-05-07 DIAGNOSIS — F411 Generalized anxiety disorder: Secondary | ICD-10-CM

## 2015-06-06 ENCOUNTER — Ambulatory Visit (INDEPENDENT_AMBULATORY_CARE_PROVIDER_SITE_OTHER): Payer: Self-pay | Admitting: Psychology

## 2015-06-06 ENCOUNTER — Encounter (HOSPITAL_COMMUNITY): Payer: Self-pay | Admitting: Psychology

## 2015-06-06 DIAGNOSIS — F411 Generalized anxiety disorder: Secondary | ICD-10-CM

## 2015-06-06 DIAGNOSIS — F331 Major depressive disorder, recurrent, moderate: Secondary | ICD-10-CM
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
## 2015-06-09 ENCOUNTER — Telehealth (HOSPITAL_COMMUNITY): Payer: Self-pay | Admitting: *Deleted

## 2015-06-09 NOTE — Telephone Encounter (Signed)
left voice message, provider out of office weeks of 6/26-7/10.

## 2015-06-11 ENCOUNTER — Encounter (HOSPITAL_COMMUNITY): Payer: Self-pay | Admitting: Psychology

## 2015-06-11 NOTE — Progress Notes (Signed)
PROGRESS NOTE  Patient:  Beth Sanford   DOB: 10/19/1970  MR Number: [id number]  Location: [facility]AL [facility] [address] [place] [address] Dept: [phone]  Start: 2 PM End: 3 PM  Provider/Observer:     Hershal CoriaJohn R Jackquelyn Sundberg PSYD  Chief Complaint:      Chief Complaint  Patient presents with  . Depression  . Anxiety  . Stress    Reason For Service:     The patient was referred by Dr. Tenny Crawoss for psychotherapeutic interventions. The patient reports that she has had significant troubles with depression for many years. She reports that she had been seen Dr. Tiburcio PeaHarris for psychiatric services as well as a counselor and like them both by Dr. Tiburc[facility]Harris [facility]. She then tried a new psychiatrist and did not particularly related to them very well and stopped seeing him. She is now return in and started seeing Dr. Tenny Crawoss. The patient reports that she's been without her medications for depression and anxiety for some time. The patient reports that she has started back on her psychotropic medications and has been feeling better. However, she reports that she continues to be "stressed out" and has recurrent panic attacks. The patient also significant medical issues including congestive heart disease and COPD. The patient reports that while she has been feeling better she has continued to have a lot of anxiety and depression. Reports that she is in the process of quitting smoking cigarettes and has only been using E. cigarette/vapor and is also trying to stop that as well. The patient reports that there continues to be a lot of stress between her and her long-time boyfriend and she reports that he has not been time for related to work very well at all.  Interventions Strategy:  Cognitive/behavioral psychotherapeutic interventions  Participation Level:   Active  Participation Quality:  Appropriate       Behavioral Observation:  Well Groomed, Alert, and Appropriate.   Current Psychosocial Factors: The patient  Continues to report significant difficulties between her and her mother. She reports that there has been some progress now that her brother is having all of his legal difficulties and the patient is essentially needed by her mother to take care of more things around the mother's house.    Content of Session:   Review current symptoms and work on therapeutic interventions for issues related to recurrent depression as well as anxiety.  Current Status:   The patient reports that she is doing better with depression and anxeity, but her BP is very high and she is having trouble with CAP mask.  Working of new mask.      Patient Progress:   Stable with improvement initially from psychotropic interventions.  Target Goals:   Target goals include reducing the intensity, severity, and duration of symptoms of depression including feelings of hopelessness and helplessness, social isolation, and withdrawal from others. The patient reports that she would also like to work specifically on reducing the frequency and intensity of her anxiety and panic-like symptoms  Last Reviewed:   03/06/2015  Goals Addressed Today:    Today we worked on Producer, television/film/videobuilding coping skills and strategies utilizing the cognitive approach around her interpretation of her situation in her life.  Impression/Diagnosis:   The patient has a long history of recurrent symptoms of depression without psychotic features. She also describes significant symptoms of anxiety and possible panic attack.  Diagnosis:    Axis I: Major depressive disorder, recurrent episode, moderate (HCC)  Generalized anxiety disorder   Melba Araki R, PsyD

## 2015-06-26 ENCOUNTER — Encounter (HOSPITAL_COMMUNITY): Payer: Self-pay | Admitting: Psychiatry

## 2015-06-26 ENCOUNTER — Ambulatory Visit (INDEPENDENT_AMBULATORY_CARE_PROVIDER_SITE_OTHER): Payer: Self-pay | Admitting: Psychiatry

## 2015-06-26 VITALS — BP 177/124 | HR 85 | Ht 65.0 in | Wt 159.0 lb

## 2015-06-26 DIAGNOSIS — F331 Major depressive disorder, recurrent, moderate: Secondary | ICD-10-CM

## 2015-06-26 MED ORDER — CITALOPRAM HYDROBROMIDE 20 MG PO TABS: 20.0000 mg | ORAL_TABLET | Freq: Every day | ORAL | Status: DC

## 2015-06-26 MED ORDER — MIRTAZAPINE 30 MG PO TABS: 30.0000 mg | ORAL_TABLET | Freq: Every day | ORAL | Status: DC

## 2015-06-26 MED ORDER — ALPRAZOLAM 1 MG PO TABS: 1.0000 mg | ORAL_TABLET | Freq: Four times a day (QID) | ORAL | Status: DC
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
## 2015-07-01 ENCOUNTER — Ambulatory Visit (HOSPITAL_COMMUNITY): Payer: Self-pay | Admitting: Psychology

## 2015-07-23 ENCOUNTER — Encounter (HOSPITAL_COMMUNITY): Payer: Self-pay | Admitting: Psychology
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
## 2015-08-05 ENCOUNTER — Encounter: Payer: Self-pay | Admitting: *Deleted

## 2015-08-18 ENCOUNTER — Other Ambulatory Visit: Payer: Self-pay | Admitting: Cardiovascular Disease

## 2015-08-18 ENCOUNTER — Ambulatory Visit: Payer: Self-pay | Admitting: Cardiovascular Disease

## 2015-08-18 MED ORDER — LISINOPRIL 20 MG PO TABS: ORAL_TABLET | ORAL | 3 refills | Status: DC

## 2015-08-18 MED ORDER — LISINOPRIL 20 MG PO TABS: ORAL_TABLET | ORAL | 1 refills | Status: DC

## 2015-08-18 MED ORDER — VERAPAMIL HCL ER 240 MG PO TBCR: 240.0000 mg | EXTENDED_RELEASE_TABLET | Freq: Every day | ORAL | 3 refills | Status: DC

## 2015-08-18 MED ORDER — VERAPAMIL HCL ER 240 MG PO TBCR: 240.0000 mg | EXTENDED_RELEASE_TABLET | Freq: Every day | ORAL | 6 refills | Status: DC

## 2015-08-18 NOTE — Telephone Encounter (Signed)
Sent in refills 

## 2015-08-18 NOTE — Telephone Encounter (Signed)
°*  STAT* If patient is at the pharmacy, [facility] can be transferred to refill team.   1. Which medications need to be refilled? (please list name of each medication and dose if known) Verapamil 240mg  & Lisinopril 20mg   2. Which pharmacy/location (including street and city if local pharmacy) is medication to be sent to? [facility] [place] for [place]  3. Do they need a 30 day or 90 day supply? Enough to get her through to her next appointment since Dr. Kirtland BouchardK had an emergency and we had to reschedule her appointment from today.   Patient's scheduled to see Dr. Kirtland BouchardK on 09/26/15 @ 3:20p.

## 2015-08-21 ENCOUNTER — Encounter: Payer: Self-pay | Admitting: Gastroenterology

## 2015-08-21 ENCOUNTER — Ambulatory Visit (INDEPENDENT_AMBULATORY_CARE_PROVIDER_SITE_OTHER): Payer: Self-pay | Admitting: Psychology

## 2015-08-21 DIAGNOSIS — F331 Major depressive disorder, recurrent, moderate: Secondary | ICD-10-CM

## 2015-08-21 DIAGNOSIS — F411 Generalized anxiety disorder: Secondary | ICD-10-CM

## 2015-08-21 NOTE — Progress Notes (Signed)
PROGRESS NOTE  Patient:  Beth Sanford   DOB: 11/17/1970  MR Number: [id number]  Location: [facility] [address] [place] [address] Dept: [phone]  Start: 3 PM End: 4 PM  Provider/Observer:     Hershal CoriaJohn R Akshaj Besancon PSYD  Chief Complaint:      Chief Complaint  Patient presents with  . Anxiety  . Depression  . Stress  . Trauma    Reason For Service:     The patient was referred by Dr. Tenny Crawoss for psychotherapeutic interventions. The patient reports that she has had significant troubles with depression for many years. She reports that she had been seen Dr. Tiburcio PeaHarris for psychiatric services as well as a counselor and like them both by Dr. Tibur[facility]Harris [facility]. She then tried a new psychiatrist and did not particularly related to them very well and stopped seeing him. She is now return in and started seeing Dr. Tenny Crawoss. The patient reports that she's been without her medications for depression and anxiety for some time. The patient reports that she has started back on her psychotropic medications and has been feeling better. However, she reports that she continues to be "stressed out" and has recurrent panic attacks. The patient also significant medical issues including congestive heart disease and COPD. The patient reports that while she has been feeling better she has continued to have a lot of anxiety and depression. Reports that she is in the process of quitting smoking cigarettes and has only been using E. cigarette/vapor and is also trying to stop that as well. The patient reports that there continues to be a lot of stress between her and her long-time boyfriend and she reports that he has not been time for related to work very well at all.  Interventions Strategy:  Cognitive/behavioral psychotherapeutic interventions  Participation Level:   Active  Participation  Quality:  Appropriate      Behavioral Observation:  Well Groomed, Alert, and Appropriate.   Current Psychosocial Factors: The patient reports that she has done a little better with fears that she is Hep C positive and will have treatment at some times.  She has been focused on how she contracted this and fearful about past relationships.      Content of Session:   Review current symptoms and work on therapeutic interventions for issues related to recurrent depression as well as anxiety.  Current Status:   The patient reports that she is doing better with depression and anxeity, but her BP is very high and she is having trouble with CAP mask.  Working of new mask.      Patient Progress:   Stable with improvement initially from psychotropic interventions.  Target Goals:   Target goals include reducing the intensity, severity, and duration of symptoms of depression including feelings of hopelessness and helplessness, social isolation, and withdrawal from others. The patient reports that she would also like to work specifically on reducing the frequency and intensity of her anxiety and panic-like symptoms  Last Reviewed:   08/21/2015  Goals Addressed Today:    Today we worked on Producer, television/film/videobuilding coping skills and strategies utilizing the cognitive approach around her interpretation of her situation in her life.  Impression/Diagnosis:   The patient has a long history of recurrent symptoms of depression without psychotic features. She also describes significant symptoms of anxiety and possible panic attack.  Diagnosis:    Axis I:  No diagnosis found.      Hershal CoriaRODENBOUGH,Brittne Kawasaki R, PsyD 08/21/2015

## 2015-09-02 ENCOUNTER — Ambulatory Visit: Payer: Self-pay | Admitting: Physician Assistant

## 2015-09-02 ENCOUNTER — Encounter: Payer: Self-pay | Admitting: Physician Assistant

## 2015-09-02 VITALS — BP 130/94 | HR 82 | Temp 98.2°F | Ht 62.0 in | Wt 156.5 lb

## 2015-09-02 DIAGNOSIS — Z1239 Encounter for other screening for malignant neoplasm of breast: Secondary | ICD-10-CM

## 2015-09-02 DIAGNOSIS — Z131 Encounter for screening for diabetes mellitus: Secondary | ICD-10-CM

## 2015-09-02 DIAGNOSIS — G4733 Obstructive sleep apnea (adult) (pediatric): Secondary | ICD-10-CM

## 2015-09-02 DIAGNOSIS — E785 Hyperlipidemia, unspecified: Secondary | ICD-10-CM

## 2015-09-02 DIAGNOSIS — I10 Essential (primary) hypertension: Secondary | ICD-10-CM

## 2015-09-02 DIAGNOSIS — Z8619 Personal history of other infectious and parasitic diseases: Secondary | ICD-10-CM

## 2015-09-02 LAB — GLUCOSE, POCT (MANUAL RESULT ENTRY): POC Glucose: 125 mg/dl — AB (ref 70–99)

## 2015-09-02 NOTE — Progress Notes (Signed)
BP (!) 130/94 (BP Location: Left Arm, Patient Position: Sitting, Cuff Size: Normal)   Pulse 82   Temp 98.2 F (36.8 C)   Ht 5\' 2"  (1.575 m)   Wt 156 lb 8 oz (71 kg)   SpO2 97%   BMI 28.62 kg/m    Subjective:    Patient ID: Beth Sanford, female    DOB: 12/02/1970, 45 y.o.   MRN: [id number]  HPI: Beth MinerSheree C Mani is a 45 y.o. female presenting on 09/02/2015 for New Patient (Initial Visit) (pt was tested for Hep C and needs futher testing pt has appt with GI on Sept 1st pt is approved for [place] discount. pt sees a cardiologist and therapist)   HPI  -Pt has [facility] discount through 01/28/16  -she has [facility] on 9/12 for hepatitis. Pt says [facility] has [facility].    -she has appointment with Dr Purvis SheffieldKoneswaran on 9/22. The follow up is for htn.  Pt says she isn't taking the hctz and is only using the hydralazine prn.    -[facility]  -Pt says she has never had mammogram.  [facility]- says it was over 5 years ago.  Says it was normal  -She uses her cpap machine for OSA.  -pt insists she is no longer a smoker despite smelling strongly of it.  She says she is just around people who smoke    Relevant past medical, surgical, family and social history reviewed and updated as indicated. Interim medical history since our last visit reviewed. Allergies and medications reviewed and updated.   Current Outpatient Prescriptions:  .  ALPRAZolam (XANAX) 1 MG tablet, Take 1 tablet (1 mg total) by mouth 4 (four) times daily. (Patient taking differently: Take 1 mg by mouth 3 (three) times daily. ), Disp: 120 tablet, Rfl: 2 .  aspirin 81 MG chewable tablet, Chew 81 mg by mouth daily., Disp: , Rfl:  .  Ca Carbonate-Mag Hydroxide (ROLAIDS PO), Take 2 tablets by mouth daily as needed (heartburn)., Disp: , Rfl:  .  citalopram (CELEXA) 20 MG tablet, Take 1 tablet (20 mg total) by mouth daily., Disp: 30 tablet,  Rfl: 2 .  hydrALAZINE (APRESOLINE) 25 MG tablet, Take 1 tablet (25 mg total) by mouth 3 (three) times daily. (Patient taking differently: Take 25 mg by mouth as needed. ), Disp: 90 tablet, Rfl: 6 .  lisinopril (PRINIVIL,ZESTRIL) 20 MG tablet, Take 1 tab 2 times daily, Disp: 90 tablet, Rfl: 1 .  mirtazapine (REMERON) 30 MG tablet, Take 1 tablet (30 mg total) by mouth at bedtime., Disp: 30 tablet, Rfl: 2 .  Omega-3 Fatty Acids (FISH OIL PO), Take 1 tablet by mouth daily. , Disp: , Rfl:  .  ranolazine (RANEXA) 500 MG 12 hr tablet, Take 1 tablet (500 mg total) by mouth 2 (two) times daily., Disp: 60 tablet, Rfl: 6 .  verapamil (CALAN-SR) 240 MG CR tablet, Take 1 tablet (240 mg total) by mouth daily., Disp: 30 tablet, Rfl: 3 .  hydrochlorothiazide (MICROZIDE) 12.5 MG capsule, Take 12.5 mg daily AS NEEDED (Patient not taking: Reported on 09/02/2015), Disp: 90 capsule, Rfl: 3   Review of Systems  Constitutional: Negative for appetite change, chills, diaphoresis, fatigue, fever and unexpected weight change.  HENT: Negative for congestion, dental problem, drooling, ear pain, facial swelling, hearing loss, mouth sores, sneezing, sore throat, trouble swallowing and voice change.  Eyes: Negative for pain, discharge, redness, itching and visual disturbance.  Respiratory: Negative for cough, choking, shortness of breath and wheezing.   Cardiovascular: Negative for chest pain, palpitations and leg swelling.  Gastrointestinal: Negative for abdominal pain, blood in stool, constipation, diarrhea and vomiting.  Endocrine: Negative for cold intolerance, heat intolerance and polydipsia.  Genitourinary: Negative for decreased urine volume, dysuria and hematuria.  Musculoskeletal: Negative for arthralgias, back pain and gait problem.  Skin: Negative for rash.  Allergic/Immunologic: Negative for environmental allergies.  Neurological: Negative for seizures, syncope, light-headedness and headaches.  Hematological:  Negative for adenopathy.  Psychiatric/Behavioral: Positive for agitation and dysphoric mood. Negative for suicidal ideas. The patient is nervous/anxious.     Per HPI unless specifically indicated above     Objective:    BP (!) 130/94 (BP Location: Left Arm, Patient Position: Sitting, Cuff Size: Normal)   Pulse 82   Temp 98.2 F (36.8 C)   Ht 5\' 2"  (1.575 m)   Wt 156 lb 8 oz (71 kg)   SpO2 97%   BMI 28.62 kg/m   Wt Readings from Last 3 Encounters:  09/02/15 156 lb 8 oz (71 kg)  02/18/15 167 lb (75.8 kg)  12/18/14 167 lb 12.8 oz (76.1 kg)    Physical Exam  Constitutional: She is oriented to person, place, and time. She appears well-developed and well-nourished.  Pt smells very strongly of cigarette smoke  HENT:  Head: Normocephalic and atraumatic.  Mouth/Throat: Oropharynx is clear and moist. No oropharyngeal exudate.  Eyes: Conjunctivae and EOM are normal. Pupils are equal, round, and reactive to light.  Neck: Neck supple. No thyromegaly present.  Cardiovascular: Normal rate and regular rhythm.   Pulmonary/Chest: Effort normal and breath sounds normal.  Abdominal: Soft. Bowel sounds are normal. She exhibits no mass. There is no hepatosplenomegaly. There is no tenderness.  Musculoskeletal: She exhibits no edema.  Lymphadenopathy:    She has no cervical adenopathy.  Neurological: She is alert and oriented to person, place, and time. Gait normal.  Skin: Skin is warm and dry.  Psychiatric: She has a normal mood and affect. Her behavior is normal.  Vitals reviewed.   Results for orders placed or performed in visit on 09/02/15  POCT Glucose (CBG)  Result Value Ref Range   POC Glucose 125 (A) 70 - 99 mg/dl      Assessment & Plan:   Encounter Diagnoses  Name Primary?  . Essential hypertension Yes  . OSA (obstructive sleep apnea)   . Hyperlipidemia   . History of positive hepatitis C   . Screening for diabetes mellitus (DM)   . Screening for breast cancer      -Get  fasting labs. Will also get hepatitis labs to confirm and will help GI when she has appointment there in september -order screening mammogram -will recommend pt continue current medications today -record request sent to [facility] for last PAP -F/u 1 month. RTO sooner prn

## 2015-09-03 LAB — HEPATITIS C RNA QUANTITATIVE: HCV QUANT: NOT DETECTED [IU]/mL (ref <15)

## 2015-09-03 LAB — COMPREHENSIVE METABOLIC PANEL
ALK PHOS: 63 U/L (ref 33–115)
ALT: 31 U/L — AB (ref 6–29)
AST: 24 U/L (ref 10–35)
Albumin: 4.2 g/dL (ref 3.6–5.1)
BILIRUBIN TOTAL: 0.3 mg/dL (ref 0.2–1.2)
BUN: 10 mg/dL (ref 7–25)
CO2: 22 mmol/L (ref 20–31)
Calcium: 8.4 mg/dL — ABNORMAL LOW (ref 8.6–10.2)
Chloride: 103 mmol/L (ref 98–110)
Creat: 0.66 mg/dL (ref 0.50–1.10)
GLUCOSE: 111 mg/dL — AB (ref 65–99)
POTASSIUM: 4.3 mmol/L (ref 3.5–5.3)
Sodium: 134 mmol/L — ABNORMAL LOW (ref 135–146)
Total Protein: 6.8 g/dL (ref 6.1–8.1)

## 2015-09-03 LAB — LIPID PANEL
CHOL/HDL RATIO: 4.9 ratio (ref <=5.0)
Cholesterol: 182 mg/dL (ref 125–200)
HDL: 37 mg/dL — ABNORMAL LOW (ref >=46)
LDL CALC: 80 mg/dL (ref <130)
Triglycerides: 324 mg/dL — ABNORMAL HIGH (ref <150)
VLDL: 65 mg/dL — AB (ref <30)

## 2015-09-03 LAB — CBC WITH DIFFERENTIAL/PLATELET
BASOS ABS: 66 {cells}/uL (ref 0–200)
Basophils Relative: 1 %
EOS ABS: 66 {cells}/uL (ref 15–500)
EOS PCT: 1 %
HCT: 45.5 % — ABNORMAL HIGH (ref 35.0–45.0)
HEMOGLOBIN: 15.3 g/dL (ref 11.7–15.5)
LYMPHS ABS: 3102 {cells}/uL (ref 850–3900)
Lymphocytes Relative: 47 %
MCH: 32.1 pg (ref 27.0–33.0)
MCHC: 33.6 g/dL (ref 32.0–36.0)
MCV: 95.4 fL (ref 80.0–100.0)
MPV: 10 fL (ref 7.5–12.5)
Monocytes Absolute: 396 cells/uL (ref 200–950)
Monocytes Relative: 6 %
NEUTROS PCT: 45 %
Neutro Abs: 2970 cells/uL (ref 1500–7800)
Platelets: 319 10*3/uL (ref 140–400)
RBC: 4.77 MIL/uL (ref 3.80–5.10)
RDW: 12.6 % (ref 11.0–15.0)
WBC: 6.6 10*3/uL (ref 3.8–10.8)

## 2015-09-03 LAB — HEMOGLOBIN A1C
HEMOGLOBIN A1C: 5.6 % (ref <5.7)
MEAN PLASMA GLUCOSE: 114 mg/dL

## 2015-09-05 LAB — HEPATITIS C GENOTYPE: HCV Genotype: NOT DETECTED

## 2015-09-16 ENCOUNTER — Ambulatory Visit (INDEPENDENT_AMBULATORY_CARE_PROVIDER_SITE_OTHER): Payer: Self-pay | Admitting: Gastroenterology

## 2015-09-16 ENCOUNTER — Encounter: Payer: Self-pay | Admitting: Gastroenterology

## 2015-09-16 DIAGNOSIS — R945 Abnormal results of liver function studies: Secondary | ICD-10-CM | POA: Insufficient documentation

## 2015-09-16 DIAGNOSIS — R7989 Other specified abnormal findings of blood chemistry: Secondary | ICD-10-CM

## 2015-09-16 NOTE — Patient Instructions (Signed)
1. Abdominal ultrasound to check your liver.  2. We will recheck hep c labs in 3 months to make sure they stay negative.  3. Maintain standard precautions as discussed today. Do not share razor blades, toothbrushes, nail clippers.

## 2015-09-16 NOTE — Assessment & Plan Note (Signed)
45 year old lady who presents for positive hepatitis C antibody test. She has minimally elevated ALT. HCV RNA quantitative test was negative. She either had a false positive hepatitis C antibody test or she was exposed and cleared the virus on her own. Very unlikely that she has recent acute hep C but given the slim possibilities, we will recheck her HCV RNA quantitative test in 3 months. If negative at that point she clearly does not have chronic hepatitis C. I did discuss standard precautions, avoid sharing of nail clippers, razors, toothbrushes. She has no other risk factors for contracting hepatitis C.  Regarding minimally elevated ALT. We will get a baseline abdominal ultrasound as a suspect she does have some fatty liver from obesity.

## 2015-09-16 NOTE — Progress Notes (Signed)
cc'ed to pcp °

## 2015-09-16 NOTE — Progress Notes (Signed)
Primary Care Physician:  Jacquelin Hawking, PA-C  Primary Gastroenterologist:  Jonette Eva, MD   Chief Complaint  Patient presents with  . Hepatitis C    HPI:  Beth Sanford is a 45 y.o. female here For further evaluation of positive hepatitis C antibody test. She was referred by health department. She has since established care with the free clinic, [facility].   Overall patient has been feeling well. She states she went to the health department just to be screened for hepatitis C, HIV, syphilis. She really had no concerns about any exposures. She denies any history of IV or intranasal drug use in the past or present. Used to see a guy whose wife died from hepatitis C but he reported to her that he was negative. Patient has never received a blood transfusion. She has donated plasma about 3-4 years ago.  She's been pretty upset about the positive hepatitis C screen. She has been trying to figure out where her exposure could come from. Her brother does come and live with her at times when he is out of prison and she's not sure whether or not he has hepatitis C, he has history of drug use.    She denies abdominal pain, vomiting, anorexia, heartburn, constipation, diarrhea, melena, rectal bleeding.   Current Outpatient Prescriptions  Medication Sig Dispense Refill  . ALPRAZolam (XANAX) 1 MG tablet Take 1 tablet (1 mg total) by mouth 4 (four) times daily. (Patient taking differently: Take 1 mg by mouth 3 (three) times daily. ) 120 tablet 2  . aspirin 81 MG chewable tablet Chew 81 mg by mouth daily.    . Ca Carbonate-Mag Hydroxide (ROLAIDS PO) Take 2 tablets by mouth daily as needed (heartburn).    . citalopram (CELEXA) 20 MG tablet Take 1 tablet (20 mg total) by mouth daily. 30 tablet 2  . hydrALAZINE (APRESOLINE) 25 MG tablet Take 1 tablet (25 mg total) by mouth 3 (three) times daily. (Patient taking differently: Take 25 mg by mouth as needed. ) 90 tablet 6  . lisinopril  (PRINIVIL,ZESTRIL) 20 MG tablet Take 1 tab 2 times daily 90 tablet 1  . mirtazapine (REMERON) 30 MG tablet Take 1 tablet (30 mg total) by mouth at bedtime. 30 tablet 2  . Omega-3 Fatty Acids (FISH OIL PO) Take 1 tablet by mouth daily.     . ranolazine (RANEXA) 500 MG 12 hr tablet Take 1 tablet (500 mg total) by mouth 2 (two) times daily. 60 tablet 6  . verapamil (CALAN-SR) 240 MG CR tablet Take 1 tablet (240 mg total) by mouth daily. 30 tablet 3   No current facility-administered medications for this visit.     Allergies as of 09/16/2015 - Review Complete 09/16/2015  Allergen Reaction Noted  . Codeine Itching and Nausea Only 07/21/2010  . Tramadol Nausea Only 03/12/2013    Past Medical History:  Diagnosis Date  . Anxiety   . CHF (congestive heart failure) (HCC)   . Depression   . High cholesterol   . Hypertension   . OSA (obstructive sleep apnea) 09/13/2014    Past Surgical History:  Procedure Laterality Date  . CHOLECYSTECTOMY    . ELBOW SURGERY  left    Family History  Problem Relation Age of Onset  . Alcohol abuse Father   . Heart failure Father     deceased at age 30  . Hypertension Brother   . Bipolar disorder Brother   . Emphysema Maternal Grandfather   .  Colon cancer Neg Hx     Social History   Social History  . Marital status: Single    Spouse name: N/A  . Number of children: N/A  . Years of education: N/A   Occupational History  . Not on file.   Social History Main Topics  . Smoking status: Former Smoker    Packs/day: 0.50    Years: 27.00    Types: Cigarettes    Start date: 01/05/1984    Quit date: 01/05/2012  . Smokeless tobacco: Never Used  . Alcohol use No  . Drug use: No  . Sexual activity: Not Currently    Birth control/ protection: None   Other Topics Concern  . Not on file   Social History Narrative  . No narrative on file      ROS:  General: Negative for anorexia, weight loss, fever, chills, fatigue, weakness. Eyes: Negative for  vision changes.  ENT: Negative for hoarseness, difficulty swallowing , nasal congestion. CV: Negative for chest pain, angina, palpitations, dyspnea on exertion, peripheral edema.  Respiratory: Negative for dyspnea at rest, dyspnea on exertion, cough, sputum, wheezing.  GI: See history of present illness. GU:  Negative for dysuria, hematuria, urinary incontinence, urinary frequency, nocturnal urination.  MS: Negative for joint pain, low back pain.  Derm: Negative for rash or itching.  Neuro: Negative for weakness, abnormal sensation, seizure, frequent headaches, memory loss, confusion.  Psych: Negative for anxiety, depression, suicidal ideation, hallucinations.  Endo: Negative for unusual weight change.  Heme: Negative for bruising or bleeding. Allergy: Negative for rash or hives.    Physical Examination:  BP (!) 156/99   Pulse 85   Temp 97.6 F (36.4 C) (Oral)   Ht 5' 2.5" (1.588 m)   Wt 160 lb (72.6 kg)   LMP 09/07/2015 (Approximate)   BMI 28.80 kg/m    General: Well-nourished, well-developed in no acute distress.  Head: Normocephalic, atraumatic.   Eyes: Conjunctiva pink, no icterus. Mouth: Oropharyngeal mucosa moist and pink , no lesions erythema or exudate. Neck: Supple without thyromegaly, masses, or lymphadenopathy.  Lungs: Clear to auscultation bilaterally.  Heart: Regular rate and rhythm, no murmurs rubs or gallops.  Abdomen: Bowel sounds are normal, nontender, nondistended, no hepatosplenomegaly or masses, no abdominal bruits or    hernia , no rebound or guarding.   Rectal: Not performed  Extremities: No lower extremity edema. No clubbing or deformities.  Neuro: Alert and oriented x 4 , grossly normal neurologically.  Skin: Warm and dry, no rash or jaundice.   Psych: Alert and cooperative, normal mood and affect.  Labs: Lab Results  Component Value Date   HGBA1C 5.6 09/02/2015   Lab Results  Component Value Date   CREATININE 0.66 09/02/2015   BUN 10  09/02/2015   NA 134 (L) 09/02/2015   K 4.3 09/02/2015   CL 103 09/02/2015   CO2 22 09/02/2015   Lab Results  Component Value Date   ALT 31 (H) 09/02/2015   AST 24 09/02/2015   ALKPHOS 63 09/02/2015   BILITOT 0.3 09/02/2015   Lab Results  Component Value Date   WBC 6.6 09/02/2015   HGB 15.3 09/02/2015   HCT 45.5 (H) 09/02/2015   MCV 95.4 09/02/2015   PLT 319 09/02/2015   09/02/2015, HCV genotype not detected. HCV RNA not detected.  Imaging Studies: No results found.

## 2015-09-17 ENCOUNTER — Other Ambulatory Visit: Payer: Self-pay

## 2015-09-17 DIAGNOSIS — R7989 Other specified abnormal findings of blood chemistry: Secondary | ICD-10-CM

## 2015-09-17 DIAGNOSIS — R945 Abnormal results of liver function studies: Principal | ICD-10-CM

## 2015-09-18 ENCOUNTER — Encounter (HOSPITAL_COMMUNITY): Payer: Self-pay

## 2015-09-18 ENCOUNTER — Ambulatory Visit (HOSPITAL_COMMUNITY)
Admission: RE | Admit: 2015-09-18 | Discharge: 2015-09-18 | Disposition: A | Discharge status: Home / Self Care | Payer: Self-pay | Source: Ambulatory Visit | Attending: Physician Assistant | Admitting: Physician Assistant

## 2015-09-18 ENCOUNTER — Ambulatory Visit (INDEPENDENT_AMBULATORY_CARE_PROVIDER_SITE_OTHER): Payer: Self-pay | Admitting: Psychology

## 2015-09-18 ENCOUNTER — Other Ambulatory Visit: Payer: Self-pay | Admitting: Physician Assistant

## 2015-09-18 ENCOUNTER — Encounter (HOSPITAL_COMMUNITY): Payer: Self-pay | Admitting: Psychology

## 2015-09-18 ENCOUNTER — Other Ambulatory Visit (HOSPITAL_COMMUNITY): Payer: Self-pay | Admitting: Radiology

## 2015-09-18 ENCOUNTER — Ambulatory Visit (HOSPITAL_COMMUNITY)
Admission: RE | Admit: 2015-09-18 | Discharge: 2015-09-18 | Disposition: A | Discharge status: Home / Self Care | Payer: Self-pay | Source: Ambulatory Visit | Attending: Gastroenterology | Admitting: Gastroenterology

## 2015-09-18 ENCOUNTER — Ambulatory Visit (HOSPITAL_COMMUNITY): Admission: RE | Admit: 2015-09-18 | Payer: Self-pay | Source: Ambulatory Visit

## 2015-09-18 DIAGNOSIS — F411 Generalized anxiety disorder: Secondary | ICD-10-CM

## 2015-09-18 DIAGNOSIS — F331 Major depressive disorder, recurrent, moderate: Secondary | ICD-10-CM

## 2015-09-18 DIAGNOSIS — R7989 Other specified abnormal findings of blood chemistry: Secondary | ICD-10-CM | POA: Insufficient documentation

## 2015-09-18 DIAGNOSIS — Z1231 Encounter for screening mammogram for malignant neoplasm of breast: Secondary | ICD-10-CM

## 2015-09-18 DIAGNOSIS — Z9049 Acquired absence of other specified parts of digestive tract: Secondary | ICD-10-CM | POA: Insufficient documentation

## 2015-09-18 DIAGNOSIS — R945 Abnormal results of liver function studies: Secondary | ICD-10-CM

## 2015-09-18 NOTE — Progress Notes (Signed)
PROGRESS NOTE  Patient:  Beth Sanford   DOB: 06-01-1970  MR Number: [id number]  Location: [facility] [address] [place] [address] Dept: [phone]  Start: 3 PM End: 4 PM  Provider/Observer:     Hershal Coria PSYD  Chief Complaint:      Chief Complaint  Patient presents with  . Agitation  . Anxiety  . Depression    Reason For Service:     The patient was referred by Dr. Tenny Craw for psychotherapeutic interventions. The patient reports that she has had significant troubles with depression for many years. She reports that she had been seen Dr. Tiburcio Pea for psychiatric services as well as a counselor and like them both by Dr. Tiburcio Pea moved her office to [facility]. She then tried a new psychiatrist and did not particularly related to them very well and stopped seeing him. She is now return in and started seeing Dr. Tenny Craw. The patient reports that she's been without her medications for depression and anxiety for some time. The patient reports that she has started back on her psychotropic medications and has been feeling better. However, she reports that she continues to be "stressed out" and has recurrent panic attacks. The patient also significant medical issues including congestive heart disease and COPD. The patient reports that while she has been feeling better she has continued to have a lot of anxiety and depression. Reports that she is in the process of quitting smoking cigarettes and has only been using E. cigarette/vapor and is also trying to stop that as well. The patient reports that there continues to be a lot of stress between her and her long-time boyfriend and she reports that he has not been time for related to work very well at all.  Interventions Strategy:  Cognitive/behavioral psychotherapeutic interventions  Participation Level:   Active  Participation  Quality:  Appropriate      Behavioral Observation:  Well Groomed, Alert, and Appropriate.   Current Psychosocial Factors: The patient reports that she has now found out that she does not have Hep C and that it was a false positive.  Really helped her and she is .      Content of Session:   Review current symptoms and work on therapeutic interventions for issues related to recurrent depression as well as anxiety.  Current Status:   The patient reports that she is doing better with depression and anxeity, but her BP is very high and she is having trouble with CAP mask.  Working of new mask.      Patient Progress:   Stable with improvement initially from psychotropic interventions.  Target Goals:   Target goals include reducing the intensity, severity, and duration of symptoms of depression including feelings of hopelessness and helplessness, social isolation, and withdrawal from others. The patient reports that she would also like to work specifically on reducing the frequency and intensity of her anxiety and panic-like symptoms  Last Reviewed:   09/18/2015  Goals Addressed Today:    Today we worked on Producer, television/film/video and strategies utilizing the cognitive approach around her interpretation of her situation in her life.  Impression/Diagnosis:   The patient has a long history of recurrent symptoms of depression without psychotic features. She also describes significant symptoms of anxiety and possible panic attack.  Diagnosis:    Axis I: Major depressive disorder, recurrent episode, moderate (HCC)  Generalized anxiety disorder  Hershal CoriaRODENBOUGH,Tranika Scholler R, PsyD 09/18/2015

## 2015-09-22 ENCOUNTER — Ambulatory Visit: Payer: Self-pay | Admitting: Physician Assistant

## 2015-09-22 VITALS — BP 146/82 | HR 88 | Temp 97.7°F | Ht 62.5 in | Wt 160.5 lb

## 2015-09-22 DIAGNOSIS — H6092 Unspecified otitis externa, left ear: Secondary | ICD-10-CM

## 2015-09-22 MED ORDER — NEOMYCIN-POLYMYXIN-HC 3.5-10000-1 OT SOLN: 4.0000 [drp] | Freq: Four times a day (QID) | OTIC | 0 refills | Status: AC

## 2015-09-22 NOTE — Progress Notes (Signed)
BP (!) 146/82 (BP Location: Left Arm, Patient Position: Sitting, Cuff Size: Normal)   Pulse 88   Temp 97.7 F (36.5 C)   Ht 5' 2.5" (1.588 m)   Wt 160 lb 8 oz (72.8 kg)   LMP 09/07/2015 (Approximate)   SpO2 98%   BMI 28.89 kg/m    Subjective:    Patient ID: Beth Sanford, female    DOB: 09-30-1970, 45 y.o.   MRN: [id number]  HPI: Beth Sanford is a 45 y.o. female presenting on 09/22/2015 for Ear Pain   Otalgia   There is pain in the left ear. This is a new problem. The current episode started in the past 7 days (3 d ago). The problem occurs constantly. There has been no fever. Pain severity now: feels stopped up. Associated symptoms include headaches. Pertinent negatives include no abdominal pain, coughing, diarrhea, hearing loss, rash, sore throat or vomiting. She has tried NSAIDs for the symptoms. The treatment provided mild relief.    Relevant past medical, surgical, family and social history reviewed and updated as indicated. Interim medical history since our last visit reviewed. Allergies and medications reviewed and updated.  CURRENT MEDS: Xanax Aspirin Calcium carbonate Citalopram Hydralazine Lisinopril remeron verapamil  Review of Systems  Constitutional: Negative for appetite change, chills, diaphoresis, fatigue, fever and unexpected weight change.  HENT: Positive for ear pain. Negative for congestion, drooling, facial swelling, hearing loss, mouth sores, sneezing, sore throat, trouble swallowing and voice change.   Eyes: Negative for pain, discharge, redness, itching and visual disturbance.  Respiratory: Negative for cough, choking, shortness of breath and wheezing.   Cardiovascular: Negative for chest pain, palpitations and leg swelling.  Gastrointestinal: Negative for abdominal pain, blood in stool, constipation, diarrhea and vomiting.  Endocrine: Negative for cold intolerance, heat intolerance and polydipsia.  Genitourinary: Negative for decreased urine  volume, dysuria and hematuria.  Musculoskeletal: Negative for arthralgias, back pain and gait problem.  Skin: Negative for rash.  Allergic/Immunologic: Negative for environmental allergies.  Neurological: Positive for headaches. Negative for seizures, syncope and light-headedness.  Hematological: Negative for adenopathy.  Psychiatric/Behavioral: Negative for agitation, dysphoric mood and suicidal ideas. The patient is not nervous/anxious.     Per HPI unless specifically indicated above     Objective:    BP (!) 146/82 (BP Location: Left Arm, Patient Position: Sitting, Cuff Size: Normal)   Pulse 88   Temp 97.7 F (36.5 C)   Ht 5' 2.5" (1.588 m)   Wt 160 lb 8 oz (72.8 kg)   LMP 09/07/2015 (Approximate)   SpO2 98%   BMI 28.89 kg/m   Wt Readings from Last 3 Encounters:  09/22/15 160 lb 8 oz (72.8 kg)  09/16/15 160 lb (72.6 kg)  09/02/15 156 lb 8 oz (71 kg)    Physical Exam  Constitutional: She is oriented to person, place, and time. She appears well-developed and well-nourished.  HENT:  Head: Normocephalic and atraumatic.  Right Ear: Hearing, tympanic membrane, external ear and ear canal normal.  Left Ear: Hearing and tympanic membrane normal. There is drainage. A foreign body is present.  Nose: Nose normal.  Mouth/Throat: Uvula is midline and oropharynx is clear and moist. No oropharyngeal exudate.  Neck: Neck supple.  Cardiovascular: Normal rate and regular rhythm.   Pulmonary/Chest: Effort normal and breath sounds normal. She has no wheezes.  Abdominal: Soft. Bowel sounds are normal. She exhibits no mass. There is no hepatosplenomegaly. There is no tenderness.  Musculoskeletal: She exhibits no edema.  Lymphadenopathy:  She has no cervical adenopathy.  Neurological: She is alert and oriented to person, place, and time.  Skin: Skin is warm and dry.  Psychiatric: She has a normal mood and affect. Her behavior is normal.  Vitals reviewed.       Assessment & Plan:    Encounter Diagnosis  Name Primary?  . Left otitis externa Yes     -rx cortisporin otic drops.  otc nsaid or apap prn pain -f/u as scheduled.  RTO sooner prn worsening or new symptoms

## 2015-09-24 ENCOUNTER — Ambulatory Visit (INDEPENDENT_AMBULATORY_CARE_PROVIDER_SITE_OTHER): Payer: Self-pay | Admitting: Psychiatry

## 2015-09-24 ENCOUNTER — Encounter (HOSPITAL_COMMUNITY): Payer: Self-pay | Admitting: Psychiatry

## 2015-09-24 VITALS — BP 170/108 | HR 81 | Ht 62.5 in | Wt 161.0 lb

## 2015-09-24 DIAGNOSIS — F411 Generalized anxiety disorder: Secondary | ICD-10-CM

## 2015-09-24 DIAGNOSIS — F331 Major depressive disorder, recurrent, moderate: Secondary | ICD-10-CM

## 2015-09-24 MED ORDER — MIRTAZAPINE 30 MG PO TABS: 30.0000 mg | ORAL_TABLET | Freq: Every day | ORAL | 2 refills | Status: DC

## 2015-09-24 MED ORDER — CITALOPRAM HYDROBROMIDE 20 MG PO TABS: 20.0000 mg | ORAL_TABLET | Freq: Two times a day (BID) | ORAL | 2 refills | Status: DC

## 2015-09-24 MED ORDER — ALPRAZOLAM 1 MG PO TABS: 1.0000 mg | ORAL_TABLET | Freq: Four times a day (QID) | ORAL | 2 refills | Status: DC
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
## 2015-09-26 ENCOUNTER — Encounter: Payer: Self-pay | Admitting: Cardiovascular Disease

## 2015-09-26 ENCOUNTER — Ambulatory Visit (INDEPENDENT_AMBULATORY_CARE_PROVIDER_SITE_OTHER): Payer: Self-pay | Admitting: Cardiovascular Disease

## 2015-09-26 VITALS — BP 152/90 | HR 79 | Ht 62.0 in | Wt 160.0 lb

## 2015-09-26 DIAGNOSIS — I10 Essential (primary) hypertension: Secondary | ICD-10-CM

## 2015-09-26 DIAGNOSIS — R0789 Other chest pain: Secondary | ICD-10-CM

## 2015-09-26 NOTE — Patient Instructions (Signed)
Your physician wants you to follow-up in:  1 year with Harriet PhoK Lawrence NP You will receive a reminder letter in the mail two months in advance. If you don't receive a letter, please Mirabile our office to schedule the follow-up appointment.     Your physician recommends that you continue on your current medications as directed. Please refer to the Current Medication list given to you today.   I have referred you to [facility] for your [facility]  [phone]       Thank you for choosing [facility] !

## 2015-09-26 NOTE — Progress Notes (Signed)
SUBJECTIVE: 45 year old woman with a history of hypertension. She does not take medications routinely and she cannot afford them. She also has chronic chest pain for which she takes Ranexa (she says she's taking as needed). BP also elevated and has been taking hydralazine prn.  Has h/o anxiety as well.  Review of Systems: As per "subjective", otherwise negative.  Allergies  Allergen Reactions  . Codeine Itching and Nausea Only  . Tramadol Nausea Only    Current Outpatient Prescriptions  Medication Sig Dispense Refill  . ALPRAZolam (XANAX) 1 MG tablet Take 1 tablet (1 mg total) by mouth 4 (four) times daily. 120 tablet 2  . aspirin 81 MG chewable tablet Chew 81 mg by mouth daily.    . Ca Carbonate-Mag Hydroxide (ROLAIDS PO) Take 2 tablets by mouth daily as needed (heartburn).    . citalopram (CELEXA) 20 MG tablet Take 1 tablet (20 mg total) by mouth 2 (two) times daily. 60 tablet 2  . hydrALAZINE (APRESOLINE) 25 MG tablet Take 1 tablet (25 mg total) by mouth 3 (three) times daily. (Patient taking differently: Take 25 mg by mouth as needed. ) 90 tablet 6  . lisinopril (PRINIVIL,ZESTRIL) 20 MG tablet Take 1 tab 2 times daily 90 tablet 1  . mirtazapine (REMERON) 30 MG tablet Take 1 tablet (30 mg total) by mouth at bedtime. 30 tablet 2  . neomycin-polymyxin-hydrocortisone (CORTISPORIN) otic solution Place 4 drops into the left ear 4 (four) times daily. 10 mL 0  . Omega-3 Fatty Acids (FISH OIL PO) Take 1 tablet by mouth daily.     . ranolazine (RANEXA) 500 MG 12 hr tablet Take 1 tablet (500 mg total) by mouth 2 (two) times daily. 60 tablet 6  . verapamil (CALAN-SR) 240 MG CR tablet Take 1 tablet (240 mg total) by mouth daily. 30 tablet 3   No current facility-administered medications for this visit.     Past Medical History:  Diagnosis Date  . Anxiety   . CHF (congestive heart failure) (HCC)   . Depression   . High cholesterol   . Hypertension   . OSA (obstructive sleep  apnea) 09/13/2014    Past Surgical History:  Procedure Laterality Date  . CHOLECYSTECTOMY    . ELBOW SURGERY  left    Social History   Social History  . Marital status: Single    Spouse name: N/A  . Number of children: N/A  . Years of education: N/A   Occupational History  . Not on file.   Social History Main Topics  . Smoking status: Former Smoker    Packs/day: 0.50    Years: 27.00    Types: Cigarettes    Start date: 01/05/1984    Quit date: 01/05/2012  . Smokeless tobacco: Never Used  . Alcohol use No  . Drug use: No  . Sexual activity: Not Currently    Birth control/ protection: None   Other Topics Concern  . Not on file   Social History Narrative  . No narrative on file     Vitals:   09/26/15 1511  BP: (!) 152/90  Pulse: 79  SpO2: 99%  Weight: 160 lb (72.6 kg)  Height: 5\' 2"  (1.575 m)    PHYSICAL EXAM General: NAD HEENT: Normal. Neck: No JVD, no thyromegaly. Lungs: Clear to auscultation bilaterally with normal respiratory effort. CV: Nondisplaced PMI.  Regular rate and rhythm, normal S1/S2, no S3/S4, no murmur. No pretibial or periankle edema.  Abdomen: Soft, nontender, no  distention.  Neurologic: Alert and oriented.  Psych: Normal affect. Skin: Normal. Musculoskeletal: No gross deformities.    ECG: Most recent ECG reviewed.      ASSESSMENT AND PLAN: 1. Malignant HTN: Elevated. Doesn't take meds regularly. Will make referral to free clinic to see if she can get meds provided at no cost. Instructed to take hydralazine 25 mg TID.  2. Chest pain: Chronic. Takes Ranexa prn. Deemed secondary to anxiety and musculoskeletal chest wall pain in the past, due to rib fractures. May be due to severe hypertensive heart disease.  Dispo: fu 1 year.   Prentice DockerSuresh Liddie Chichester, M.D., F.A.C.C.

## 2015-09-28 NOTE — Progress Notes (Signed)
Please let patient know her u/s shows fatty liver.   Instructions for fatty liver: Recommend 1-2# weight loss per week until ideal body weight through exercise & diet. Low fat/cholesterol diet.   Avoid sweets, sodas, fruit juices, sweetened beverages like tea, etc. Gradually increase exercise from 15 min daily up to 1 hr per day 5 days/week. Limit alcohol use.  LET'S HAVE HER RECHECK LFTS AT TIME OF HER NEXT HCV RNA TEST IN 3 MONTHS.

## 2015-09-29 ENCOUNTER — Other Ambulatory Visit: Payer: Self-pay

## 2015-09-29 DIAGNOSIS — R945 Abnormal results of liver function studies: Secondary | ICD-10-CM

## 2015-09-29 DIAGNOSIS — R7989 Other specified abnormal findings of blood chemistry: Secondary | ICD-10-CM

## 2015-09-29 NOTE — Progress Notes (Signed)
Pt is aware of results and plan.

## 2015-09-29 NOTE — Progress Notes (Signed)
LMOM to [place]. Lab order for the LFT's on file with the Dec labs.

## 2015-10-01 ENCOUNTER — Encounter: Payer: Self-pay | Admitting: Physician Assistant

## 2015-10-01 ENCOUNTER — Ambulatory Visit: Payer: Self-pay | Admitting: Physician Assistant

## 2015-10-01 VITALS — BP 174/112 | HR 78 | Temp 97.2°F | Ht 62.0 in | Wt 163.2 lb

## 2015-10-01 DIAGNOSIS — F39 Unspecified mood [affective] disorder: Secondary | ICD-10-CM

## 2015-10-01 DIAGNOSIS — E785 Hyperlipidemia, unspecified: Secondary | ICD-10-CM

## 2015-10-01 DIAGNOSIS — I10 Essential (primary) hypertension: Secondary | ICD-10-CM

## 2015-10-01 MED ORDER — HYDRALAZINE HCL 25 MG PO TABS: 25.0000 mg | ORAL_TABLET | Freq: Three times a day (TID) | ORAL | 1 refills | Status: DC

## 2015-10-01 NOTE — Progress Notes (Signed)
BP (!) 174/112 (BP Location: Left Arm, Patient Position: Sitting, Cuff Size: Normal)   Pulse 78   Temp 97.2 F (36.2 C) (Other (Comment))   Ht _0  (1.575 m)   Wt 163 lb 3.2 oz (74 kg)   LMP 09/24/2015   SpO2 98%   BMI 29.85 kg/m    Subjective:    Patient ID: Beth Sanford, female    DOB: 12-16-1970, 45 y.o.   MRN: [id number]  HPI: Beth Sanford is a 46 y.o. female presenting on 10/01/2015 for Hypertension   HPI  Pt states her ear is better.  She has no complaints today.  Pt saw cardiologist / dr Jacinta Shoe in September.    Pt is not taking her antihypertensive medications properly.  Explained to her that this is likely why her bp is so high today.  She states understanding.  She denies HA, vision changes, CP today.   Relevant past medical, surgical, family and social history reviewed and updated as indicated. Interim medical history since our last visit reviewed. Allergies and medications reviewed and updated.   Current Outpatient Prescriptions:  .  ALPRAZolam (XANAX) 1 MG tablet, Take 1 tablet (1 mg total) by mouth 4 (four) times daily., Disp: 120 tablet, Rfl: 2 .  aspirin 81 MG chewable tablet, Chew 81 mg by mouth daily., Disp: , Rfl:  .  Ca Carbonate-Mag Hydroxide (ROLAIDS PO), Take 2 tablets by mouth daily as needed (heartburn)., Disp: , Rfl:  .  citalopram (CELEXA) 20 MG tablet, Take 1 tablet (20 mg total) by mouth 2 (two) times daily., Disp: 60 tablet, Rfl: 2 .  lisinopril (PRINIVIL,ZESTRIL) 20 MG tablet, Take 1 tab 2 times daily, Disp: 90 tablet, Rfl: 1 .  mirtazapine (REMERON) 30 MG tablet, Take 1 tablet (30 mg total) by mouth at bedtime., Disp: 30 tablet, Rfl: 2 .  Omega-3 Fatty Acids (FISH OIL PO), Take 1 tablet by mouth daily. , Disp: , Rfl:  .  ranolazine (RANEXA) 500 MG 12 hr tablet, Take 1 tablet (500 mg total) by mouth 2 (two) times daily., Disp: 60 tablet, Rfl: 6 .  verapamil (CALAN-SR) 240 MG CR tablet, Take 1 tablet (240 mg total) by mouth daily., Disp: 30  tablet, Rfl: 3 .  hydrALAZINE (APRESOLINE) 25 MG tablet, Take 1 tablet (25 mg total) by mouth 3 (three) times daily. (Patient not taking: Reported on 10/01/2015), Disp: 90 tablet, Rfl: 6  Review of Systems  Constitutional: Negative for appetite change, chills, diaphoresis, fatigue, fever and unexpected weight change.  HENT: Negative for congestion, drooling, ear pain, facial swelling, hearing loss, mouth sores, sneezing, sore throat, trouble swallowing and voice change.   Eyes: Negative for pain, discharge, redness, itching and visual disturbance.  Respiratory: Negative for cough, choking, shortness of breath and wheezing.   Cardiovascular: Positive for chest pain. Negative for palpitations and leg swelling.  Gastrointestinal: Negative for abdominal pain, blood in stool, constipation, diarrhea and vomiting.  Endocrine: Positive for heat intolerance. Negative for cold intolerance and polydipsia.  Genitourinary: Negative for decreased urine volume, dysuria and hematuria.  Musculoskeletal: Negative for arthralgias, back pain and gait problem.  Skin: Negative for rash.  Allergic/Immunologic: Negative for environmental allergies.  Neurological: Negative for seizures, syncope, light-headedness and headaches.  Hematological: Negative for adenopathy.  Psychiatric/Behavioral: Positive for agitation and dysphoric mood. Negative for suicidal ideas. The patient is nervous/anxious.     Per HPI unless specifically indicated above     Objective:    BP (!) 174/112 (BP  Location: Left Arm, Patient Position: Sitting, Cuff Size: Normal)   Pulse 78   Temp 97.2 F (36.2 C) (Other (Comment))   Ht _0  (1.575 m)   Wt 163 lb 3.2 oz (74 kg)   LMP 09/24/2015   SpO2 98%   BMI 29.85 kg/m   Wt Readings from Last 3 Encounters:  10/01/15 163 lb 3.2 oz (74 kg)  09/26/15 160 lb (72.6 kg)  09/22/15 160 lb 8 oz (72.8 kg)    Physical Exam  Constitutional: She is oriented to person, place, and time. She appears  well-developed and well-nourished.  HENT:  Head: Normocephalic and atraumatic.  Neck: Neck supple.  Cardiovascular: Normal rate and regular rhythm.   Pulmonary/Chest: Effort normal and breath sounds normal.  Abdominal: Soft. Bowel sounds are normal. She exhibits no mass. There is no hepatosplenomegaly. There is no tenderness.  Musculoskeletal: She exhibits no edema.  Lymphadenopathy:    She has no cervical adenopathy.  Neurological: She is alert and oriented to person, place, and time.  Skin: Skin is warm and dry.  Psychiatric: She has a normal mood and affect. Her behavior is normal.  Vitals reviewed.   Results for orders placed or performed in visit on 09/02/15  Lipid Profile  Result Value Ref Range   Cholesterol 182 125 - 200 mg/dL   Triglycerides 324 (H) <150 mg/dL   HDL 37 (L) >=46 mg/dL   Total CHOL/HDL Ratio 4.9 <=5.0 Ratio   VLDL 65 (H) <30 mg/dL   LDL Cholesterol 80 <130 mg/dL  Comprehensive Metabolic Panel (CMET)  Result Value Ref Range   Sodium 134 (L) 135 - 146 mmol/L   Potassium 4.3 3.5 - 5.3 mmol/L   Chloride 103 98 - 110 mmol/L   CO2 22 20 - 31 mmol/L   Glucose, Bld 111 (H) 65 - 99 mg/dL   BUN 10 7 - 25 mg/dL   Creat 0.66 0.50 - 1.10 mg/dL   Total Bilirubin 0.3 0.2 - 1.2 mg/dL   Alkaline Phosphatase 63 33 - 115 U/L   AST 24 10 - 35 U/L   ALT 31 (H) 6 - 29 U/L   Total Protein 6.8 6.1 - 8.1 g/dL   Albumin 4.2 3.6 - 5.1 g/dL   Calcium 8.4 (L) 8.6 - 10.2 mg/dL  CBC w/Diff/Platelet  Result Value Ref Range   WBC 6.6 3.8 - 10.8 K/uL   RBC 4.77 3.80 - 5.10 MIL/uL   Hemoglobin 15.3 11.7 - 15.5 g/dL   HCT 45.5 (H) 35.0 - 45.0 %   MCV 95.4 80.0 - 100.0 fL   MCH 32.1 27.0 - 33.0 pg   MCHC 33.6 32.0 - 36.0 g/dL   RDW 12.6 11.0 - 15.0 %   Platelets 319 140 - 400 K/uL   MPV 10.0 7.5 - 12.5 fL   Neutro Abs 2,970 1,500 - 7,800 cells/uL   Lymphs Abs 3,102 850 - 3,900 cells/uL   Monocytes Absolute 396 200 - 950 cells/uL   Eosinophils Absolute 66 15 - 500 cells/uL    Basophils Absolute 66 0 - 200 cells/uL   Neutrophils Relative % 45 %   Lymphocytes Relative 47 %   Monocytes Relative 6 %   Eosinophils Relative 1 %   Basophils Relative 1 %   Smear Review Criteria for review not met   Hepatitis C RNA quantitative  Result Value Ref Range   HCV Quantitative Not Detected <15 IU/mL   HCV Quantitative Log NOT CALC <1.18 log 10  Hepatitis C  Genotype  Result Value Ref Range   HCV Genotype Not Detected   HgB A1c  Result Value Ref Range   Hgb A1c MFr Bld 5.6 <5.7 %   Mean Plasma Glucose 114 mg/dL  POCT Glucose (CBG)  Result Value Ref Range   POC Glucose 125 (A) 70 - 99 mg/dl      Assessment & Plan:   Encounter Diagnoses  Name Primary?  . Essential hypertension, malignant Yes  . Hyperlipidemia   . Mood disorder ([place])     -get pt set up with [facility] to obtain her lisinopril, remeron, celexa and hydralazine.  Pt is aware that she will have to get her psychiatrist to send rx for the remeron and celexa.  Pt is given pt assistance application to apply to get the ranexa.  She is explained how to fill it out and then take it to get Dr Hildred Alamin to complete his portion.  Told her that cards can fax it for her or if she gets it completed, we would be glad to fax it for her.  She says that she would be okay to purchase the verapamil.  Gave her coupon that she can use for that.  -reviewed labs with pt -counseled pt on lowfat diet and regular exercise to help with lipids.  Encouraged her to continue with her fish oil -counseled pt to take her bp meds regularly as prescribed -f/u 1 month to recheck bp  (spent 35 minutes with pt over half with counseling and coordination of care)

## 2015-10-02 ENCOUNTER — Other Ambulatory Visit: Payer: Self-pay | Admitting: Physician Assistant

## 2015-10-02 MED ORDER — LISINOPRIL 20 MG PO TABS: ORAL_TABLET | ORAL | 1 refills | Status: DC

## 2015-10-02 MED ORDER — HYDRALAZINE HCL 25 MG PO TABS: 25.0000 mg | ORAL_TABLET | Freq: Three times a day (TID) | ORAL | 1 refills | Status: DC

## 2015-10-15 ENCOUNTER — Encounter (INDEPENDENT_AMBULATORY_CARE_PROVIDER_SITE_OTHER): Payer: Self-pay

## 2015-10-15 ENCOUNTER — Encounter (HOSPITAL_COMMUNITY): Payer: Self-pay | Admitting: Psychology

## 2015-10-15 ENCOUNTER — Ambulatory Visit (INDEPENDENT_AMBULATORY_CARE_PROVIDER_SITE_OTHER): Payer: Self-pay | Admitting: Psychology

## 2015-10-15 DIAGNOSIS — F331 Major depressive disorder, recurrent, moderate: Secondary | ICD-10-CM

## 2015-10-15 DIAGNOSIS — F411 Generalized anxiety disorder: Secondary | ICD-10-CM

## 2015-10-15 NOTE — Progress Notes (Signed)
PROGRESS NOTE  Patient:  Beth Sanford   DOB: Apr 29, 1970  MR Number: [id number]  Location: BEHAVIORAL [facility] [address] [place] [address] Dept: [phone]  Start: 3 PM End: 4 PM  Provider/Observer:     Hershal Coria PSYD  Chief Complaint:      Chief Complaint  Patient presents with  . Anxiety  . Depression  . Stress    Reason For Service:     The patient was referred by Dr. Tenny Craw for psychotherapeutic interventions. The patient reports that she has had significant troubles with depression for many years. She reports that she had been seen Dr. Tiburcio Pea for psychiatric services as well as a counselor and like them both by Dr. Tiburcio Pea moved her office to [facility]. She then tried a new psychiatrist and did not particularly related to them very well and stopped seeing him. She is now return in and started seeing Dr. Tenny Craw. The patient reports that she's been without her medications for depression and anxiety for some time. The patient reports that she has started back on her psychotropic medications and has been feeling better. However, she reports that she continues to be "stressed out" and has recurrent panic attacks. The patient also significant medical issues including congestive heart disease and COPD. The patient reports that while she has been feeling better she has continued to have a lot of anxiety and depression. Reports that she is in the process of quitting smoking cigarettes and has only been using E. cigarette/vapor and is also trying to stop that as well. The patient reports that there continues to be a lot of stress between her and her long-time boyfriend and she reports that he has not been time for related to work very well at all.  Interventions Strategy:  Cognitive/behavioral psychotherapeutic interventions  Participation Level:   Active  Participation Quality:  Appropriate       Behavioral Observation:  Well Groomed, Alert, and Appropriate.   Current Psychosocial Factors: The patient reports that she is at odds with her mother again and the mother is saying she is not going to pay rent.  The patient coping with it better than she has in past.      Content of Session:   Review current symptoms and work on therapeutic interventions for issues related to recurrent depression as well as anxiety.  Current Status:   The patient reports that she is doing better with depression and anxeity, and BP continues to be high.  She reports that the free clinic is working on getting her meds     Patient Progress:   Stable with improvement initially from psychotropic interventions.  Target Goals:   Target goals include reducing the intensity, severity, and duration of symptoms of depression including feelings of hopelessness and helplessness, social isolation, and withdrawal from others. The patient reports that she would also like to work specifically on reducing the frequency and intensity of her anxiety and panic-like symptoms  Last Reviewed:   10/15/2015  Goals Addressed Today:    Today we worked on Producer, television/film/video and strategies utilizing the cognitive approach around her interpretation of her situation in her life.  Impression/Diagnosis:   The patient has a long history of recurrent symptoms of depression without psychotic features. She also describes significant symptoms of anxiety and possible panic attack.  Diagnosis:    Axis I: Major depressive disorder, recurrent episode, moderate (HCC)  Generalized anxiety disorder      Cederick Broadnax R, PsyD 10/15/2015

## 2015-10-29 ENCOUNTER — Encounter: Payer: Self-pay | Admitting: Physician Assistant

## 2015-10-29 ENCOUNTER — Ambulatory Visit: Payer: Self-pay | Admitting: Physician Assistant

## 2015-10-29 VITALS — BP 156/96 | HR 86 | Temp 98.1°F | Ht 62.0 in | Wt 154.5 lb

## 2015-10-29 DIAGNOSIS — I10 Essential (primary) hypertension: Secondary | ICD-10-CM

## 2015-10-29 MED ORDER — HYDROCHLOROTHIAZIDE 12.5 MG PO CAPS: 12.5000 mg | ORAL_CAPSULE | Freq: Every day | ORAL | 3 refills | Status: DC

## 2015-10-29 NOTE — Progress Notes (Signed)
BP (!) 156/96 (BP Location: Left Arm, Patient Position: Sitting, Cuff Size: Normal)   Pulse 86   Temp 98.1 F (36.7 C)   Ht 5\' 2"  (1.575 m)   Wt 154 lb 8 oz (70.1 kg)   SpO2 98%   BMI 28.26 kg/m    Subjective:    Patient ID: Beth Sanford, female    DOB: [birth year], 45 y.o.   MRN: [id number]  HPI: [name] is a 45 y.o. female presenting on 10/29/2015 for Hypertension   HPI   Pt sees dr Tenny Craw for [facility].      Pt still taking hctz given by Joni Reining.  Dr Henderson Baltimore did not address this rx- appears he didn't know she was taking it.    Pt was given ranexa paperwork (to get medication at no cost) at last office visit to turn into cardiology and she didn't.    She is feeling well today  Relevant past medical, surgical, family and social history reviewed and updated as indicated. Interim medical history since our last visit reviewed. Allergies and medications reviewed and updated.   Current Outpatient Prescriptions:  .  ALPRAZolam (XANAX) 1 MG tablet, Take 1 tablet (1 mg total) by mouth 4 (four) times daily., Disp: 120 tablet, Rfl: 2 .  aspirin 81 MG chewable tablet, Chew 81 mg by mouth daily., Disp: , Rfl:  .  Ca Carbonate-Mag Hydroxide (ROLAIDS PO), Take 2 tablets by mouth daily as needed (heartburn)., Disp: , Rfl:  .  citalopram (CELEXA) 20 MG tablet, Take 1 tablet (20 mg total) by mouth 2 (two) times daily., Disp: 60 tablet, Rfl: 2 .  hydrALAZINE (APRESOLINE) 25 MG tablet, Take 1 tablet (25 mg total) by mouth 3 (three) times daily., Disp: 270 tablet, Rfl: 1 .  hydrochlorothiazide (MICROZIDE) 12.5 MG capsule, Take 12.5 mg by mouth daily as needed., Disp: , Rfl:  .  lisinopril (PRINIVIL,ZESTRIL) 20 MG tablet, Take 1 tab 2 times daily, Disp: 90 tablet, Rfl: 1 .  mirtazapine (REMERON) 30 MG tablet, Take 1 tablet (30 mg total) by mouth at bedtime., Disp: 30 tablet, Rfl: 2 .  Omega-3 Fatty Acids (FISH OIL PO), Take 1 tablet by mouth daily. , Disp: , Rfl:  .  ranolazine  (RANEXA) 500 MG 12 hr tablet, Take 1 tablet (500 mg total) by mouth 2 (two) times daily., Disp: 60 tablet, Rfl: 6 .  verapamil (CALAN-SR) 240 MG CR tablet, Take 1 tablet (240 mg total) by mouth daily., Disp: 30 tablet, Rfl: 3   Review of Systems  Constitutional: Negative for appetite change, chills, diaphoresis, fatigue, fever and unexpected weight change.  HENT: Negative for congestion, dental problem, drooling, ear pain, facial swelling, hearing loss, mouth sores, sneezing, sore throat, trouble swallowing and voice change.   Eyes: Negative for pain, discharge, redness, itching and visual disturbance.  Respiratory: Negative for cough, choking, shortness of breath and wheezing.   Cardiovascular: Negative for chest pain, palpitations and leg swelling.  Gastrointestinal: Negative for abdominal pain, blood in stool, constipation, diarrhea and vomiting.  Endocrine: Negative for cold intolerance, heat intolerance and polydipsia.  Genitourinary: Negative for decreased urine volume, dysuria and hematuria.  Musculoskeletal: Negative for arthralgias, back pain and gait problem.  Skin: Negative for rash.  Allergic/Immunologic: Negative for environmental allergies.  Neurological: Negative for seizures, syncope, light-headedness and headaches.  Hematological: Negative for adenopathy.  Psychiatric/Behavioral: Positive for agitation and dysphoric mood. Negative for suicidal ideas. The patient is nervous/anxious.     Per HPI unless  specifically indicated above     Objective:    BP (!) 156/96 (BP Location: Left Arm, Patient Position: Sitting, Cuff Size: Normal)   Pulse 86   Temp 98.1 F (36.7 C)   Ht 5\' 2"  (1.575 m)   Wt 154 lb 8 oz (70.1 kg)   SpO2 98%   BMI 28.26 kg/m   Wt Readings from Last 3 Encounters:  10/29/15 154 lb 8 oz (70.1 kg)  10/01/15 163 lb 3.2 oz (74 kg)  09/26/15 160 lb (72.6 kg)    Physical Exam  Constitutional: She is oriented to person, place, and time. She appears  well-developed and well-nourished.  HENT:  Head: Normocephalic and atraumatic.  Neck: Neck supple.  Cardiovascular: Normal rate and regular rhythm.   Pulmonary/Chest: Effort normal and breath sounds normal.  Abdominal: Soft. Bowel sounds are normal. She exhibits no mass. There is no hepatosplenomegaly. There is no tenderness.  Musculoskeletal: She exhibits no edema.  Lymphadenopathy:    She has no cervical adenopathy.  Neurological: She is alert and oriented to person, place, and time.  Skin: Skin is warm and dry.  Psychiatric: She has a normal mood and affect. Her behavior is normal.  Vitals reviewed.       Assessment & Plan:    Encounter Diagnosis  Name Primary?  . Essential hypertension Yes     -Pt is given another copy of the patient assistance form to get the Ranexa.  She is counseled to take it to her cardiologist to get him to complete the MD portion. -continue current rx -pt to take the hctz every day since her bp is still too high (instead of prn). rx sent to [facility] -follow up in one month to recheck the blood pressure.  RTO sooner prn

## 2015-11-13 ENCOUNTER — Ambulatory Visit (HOSPITAL_COMMUNITY): Payer: Self-pay | Admitting: Psychology

## 2015-11-18 ENCOUNTER — Other Ambulatory Visit: Payer: Self-pay

## 2015-11-18 DIAGNOSIS — R945 Abnormal results of liver function studies: Secondary | ICD-10-CM

## 2015-11-18 DIAGNOSIS — R7989 Other specified abnormal findings of blood chemistry: Secondary | ICD-10-CM

## 2015-11-19 ENCOUNTER — Encounter (HOSPITAL_COMMUNITY): Payer: Self-pay | Admitting: Psychology

## 2015-11-19 ENCOUNTER — Ambulatory Visit (INDEPENDENT_AMBULATORY_CARE_PROVIDER_SITE_OTHER): Payer: Self-pay | Admitting: Psychology

## 2015-11-19 DIAGNOSIS — F331 Major depressive disorder, recurrent, moderate: Secondary | ICD-10-CM

## 2015-11-19 DIAGNOSIS — F411 Generalized anxiety disorder: Secondary | ICD-10-CM

## 2015-11-19 NOTE — Progress Notes (Signed)
      PROGRESS NOTE  Patient:  Griselda MinerSheree C Landstrom   DOB: 04/05/1970  MR Number: [id number]  Location: [facility]HAVIORAL [facility] [address] [place] [id number] Dept: [phone]  Start: 4 PM End: 5 PM  Provider/Observer:     Hershal CoriaJohn R Taviana Westergren PSYD  Chief Complaint:      Chief Complaint  Patient presents with  . Anxiety  . Depression  . Stress    Reason For Service:     The patient was referred by Dr. Tenny Crawoss for psychotherapeutic interventions. The patient reports that she has had significant troubles with depression for many years. She reports that she had been seen Dr. Tiburcio PeaHarris for psychiatric services as well as a counselor and like them both by Dr. Tiburcio PeaHarris moved her [facility] to [facility]. She then tried a new psychiatrist and did not particularly related to them very well and stopped seeing him. She is now return in and started seeing Dr. Tenny Crawoss. The patient reports that she's been without her medications for depression and anxiety for some time. The patient reports that she has started back on her psychotropic medications and has been feeling better. However, she reports that she continues to be "stressed out" and has recurrent panic attacks. The patient also significant medical issues including congestive heart disease and COPD. The patient reports that while she has been feeling better she has continued to have a lot of anxiety and depression. Reports that she is in the process of quitting smoking cigarettes and has only been using E. cigarette/vapor and is also trying to stop that as well. The patient reports that there continues to be a lot of stress between her and her long-time boyfriend and she reports that he has not been time for related to work very well at all.  Interventions Strategy:  Cognitive/behavioral psychotherapeutic interventions  Participation Level:   Active  Participation Quality:  Appropriate       Behavioral Observation:  Well Groomed, Alert, and Appropriate.   Current Psychosocial Factors: The patient reports that she has been doing better with her mother.  She reports that issues like car trouble.      Content of Session:   Review current symptoms and work on therapeutic interventions for issues related to recurrent depression as well as anxiety.  Current Status:   The patient reports that she is doing better with depression and anxeity, and BP continues to be high.  She reports that the free clinic is working on getting her meds     Patient Progress:   Stable with improvement initially from psychotropic interventions.  Target Goals:   Target goals include reducing the intensity, severity, and duration of symptoms of depression including feelings of hopelessness and helplessness, social isolation, and withdrawal from others. The patient reports that she would also like to work specifically on reducing the frequency and intensity of her anxiety and panic-like symptoms  Last Reviewed:   11/19/2015  Goals Addressed Today:    Today we worked on Producer, television/film/videobuilding coping skills and strategies utilizing the cognitive approach around her interpretation of her situation in her life.  Impression/Diagnosis:   The patient has a long history of recurrent symptoms of depression without psychotic features. She also describes significant symptoms of anxiety and possible panic attack.  Diagnosis:    Axis I: Major depressive disorder, recurrent episode, moderate (HCC)  Generalized anxiety disorder      Genelda Roark R, PsyD 11/19/2015

## 2015-11-24 ENCOUNTER — Ambulatory Visit (INDEPENDENT_AMBULATORY_CARE_PROVIDER_SITE_OTHER): Payer: Self-pay | Admitting: Psychiatry

## 2015-11-24 ENCOUNTER — Encounter (HOSPITAL_COMMUNITY): Payer: Self-pay | Admitting: Psychiatry

## 2015-11-24 VITALS — BP 176/110 | HR 85 | Wt 158.6 lb

## 2015-11-24 DIAGNOSIS — Z811 Family history of alcohol abuse and dependence: Secondary | ICD-10-CM

## 2015-11-24 DIAGNOSIS — F411 Generalized anxiety disorder: Secondary | ICD-10-CM

## 2015-11-24 DIAGNOSIS — Z888 Allergy status to other drugs, medicaments and biological substances status: Secondary | ICD-10-CM

## 2015-11-24 DIAGNOSIS — Z79899 Other long term (current) drug therapy: Secondary | ICD-10-CM

## 2015-11-24 DIAGNOSIS — Z818 Family history of other mental and behavioral disorders: Secondary | ICD-10-CM

## 2015-11-24 DIAGNOSIS — Z8249 Family history of ischemic heart disease and other diseases of the circulatory system: Secondary | ICD-10-CM

## 2015-11-24 DIAGNOSIS — F331 Major depressive disorder, recurrent, moderate: Secondary | ICD-10-CM

## 2015-11-24 MED ORDER — CITALOPRAM HYDROBROMIDE 20 MG PO TABS: 20.0000 mg | ORAL_TABLET | Freq: Two times a day (BID) | ORAL | 2 refills | Status: DC

## 2015-11-24 MED ORDER — MIRTAZAPINE 30 MG PO TABS: 30.0000 mg | ORAL_TABLET | Freq: Every day | ORAL | 2 refills | Status: DC

## 2015-11-24 MED ORDER — ALPRAZOLAM 1 MG PO TABS: 1.0000 mg | ORAL_TABLET | Freq: Four times a day (QID) | ORAL | 2 refills | Status: DC
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
## 2015-12-01 ENCOUNTER — Ambulatory Visit: Payer: Self-pay | Admitting: Physician Assistant

## 2015-12-10 ENCOUNTER — Encounter: Payer: Self-pay | Admitting: Physician Assistant

## 2015-12-10 ENCOUNTER — Ambulatory Visit (HOSPITAL_COMMUNITY): Payer: Self-pay | Admitting: Psychology

## 2015-12-16 ENCOUNTER — Other Ambulatory Visit: Payer: Self-pay | Admitting: Physician Assistant

## 2015-12-16 DIAGNOSIS — R945 Abnormal results of liver function studies: Principal | ICD-10-CM

## 2015-12-16 DIAGNOSIS — R7989 Other specified abnormal findings of blood chemistry: Secondary | ICD-10-CM

## 2015-12-17 LAB — HEPATIC FUNCTION PANEL
ALBUMIN: 4.3 g/dL (ref 3.6–5.1)
ALK PHOS: 75 U/L (ref 33–115)
ALT: 19 U/L (ref 6–29)
AST: 12 U/L (ref 10–35)
BILIRUBIN INDIRECT: 0.4 mg/dL (ref 0.2–1.2)
Bilirubin, Direct: 0.1 mg/dL (ref <=0.2)
TOTAL PROTEIN: 7.2 g/dL (ref 6.1–8.1)
Total Bilirubin: 0.5 mg/dL (ref 0.2–1.2)

## 2015-12-19 LAB — HEPATITIS C RNA QUANTITATIVE: HCV Quantitative: NOT DETECTED [IU]/mL

## 2015-12-22 ENCOUNTER — Encounter: Payer: Self-pay | Admitting: Physician Assistant

## 2015-12-22 ENCOUNTER — Ambulatory Visit: Payer: Self-pay | Admitting: Physician Assistant

## 2015-12-22 VITALS — BP 136/108 | HR 94 | Temp 98.1°F | Ht 62.0 in | Wt 157.4 lb

## 2015-12-22 DIAGNOSIS — F39 Unspecified mood [affective] disorder: Secondary | ICD-10-CM

## 2015-12-22 DIAGNOSIS — I10 Essential (primary) hypertension: Secondary | ICD-10-CM

## 2015-12-22 DIAGNOSIS — E785 Hyperlipidemia, unspecified: Secondary | ICD-10-CM

## 2015-12-22 NOTE — Progress Notes (Signed)
BP (!) 136/108 (BP Location: Left Arm, Patient Position: Sitting, Cuff Size: Normal)   Pulse 94   Temp 98.1 F (36.7 C) (Other (Comment))   Ht 5\' 2"  (1.575 m)   Wt 157 lb 6.4 oz (71.4 kg)   LMP 12/07/2015   SpO2 99%   BMI 28.79 kg/m    Subjective:    Patient ID: Beth Sanford, female    DOB: 10/12/1970, 45 y.o.   MRN: [id number]  HPI: Beth Sanford is a 45 y.o. female presenting on 12/22/2015 for Follow-up (states is very nervous r/t test results)   HPI   Pt sees dr Tenny Crawoss [facility].   Pt says she is doing well, just nervous about test results  Relevant past medical, surgical, family and social history reviewed and updated as indicated. Interim medical history since our last visit reviewed. Allergies and medications reviewed and updated.    Current Outpatient Prescriptions:  .  ALPRAZolam (XANAX) 1 MG tablet, Take 1 tablet (1 mg total) by mouth 4 (four) times daily. (Patient taking differently: Take 1 mg by mouth 3 (three) times daily. ), Disp: 120 tablet, Rfl: 2 .  aspirin 81 MG chewable tablet, Chew 81 mg by mouth daily., Disp: , Rfl:  .  Ca Carbonate-Mag Hydroxide (ROLAIDS PO), Take 2 tablets by mouth daily as needed (heartburn)., Disp: , Rfl:  .  citalopram (CELEXA) 20 MG tablet, Take 1 tablet (20 mg total) by mouth 2 (two) times daily., Disp: 60 tablet, Rfl: 2 .  hydrALAZINE (APRESOLINE) 25 MG tablet, Take 1 tablet (25 mg total) by mouth 3 (three) times daily., Disp: 270 tablet, Rfl: 1 .  hydrochlorothiazide (MICROZIDE) 12.5 MG capsule, Take 1 capsule (12.5 mg total) by mouth daily., Disp: 90 capsule, Rfl: 3 .  lisinopril (PRINIVIL,ZESTRIL) 20 MG tablet, Take 1 tab 2 times daily, Disp: 90 tablet, Rfl: 1 .  mirtazapine (REMERON) 30 MG tablet, Take 1 tablet (30 mg total) by mouth at bedtime., Disp: 30 tablet, Rfl: 2 .  Omega-3 Fatty Acids (FISH OIL PO), Take 1 tablet by mouth daily. , Disp: , Rfl:  .  ranolazine (RANEXA) 500 MG 12 hr tablet, Take 1 tablet (500 mg total) by  mouth 2 (two) times daily., Disp: 60 tablet, Rfl: 6 .  verapamil (CALAN-SR) 240 MG CR tablet, Take 1 tablet (240 mg total) by mouth daily., Disp: 30 tablet, Rfl: 3  Review of Systems  Constitutional: Positive for fatigue. Negative for appetite change, chills, diaphoresis, fever and unexpected weight change.  HENT: Positive for dental problem. Negative for congestion, drooling, ear pain, facial swelling, hearing loss, mouth sores, sneezing, sore throat, trouble swallowing and voice change.   Eyes: Negative for pain, discharge, redness, itching and visual disturbance.  Respiratory: Negative for cough, choking, shortness of breath and wheezing.   Cardiovascular: Negative for chest pain, palpitations and leg swelling.  Gastrointestinal: Negative for abdominal pain, blood in stool, constipation, diarrhea and vomiting.  Endocrine: Positive for heat intolerance. Negative for cold intolerance and polydipsia.  Genitourinary: Negative for decreased urine volume, dysuria and hematuria.  Musculoskeletal: Negative for arthralgias, back pain and gait problem.  Skin: Negative for rash.  Allergic/Immunologic: Negative for environmental allergies.  Neurological: Negative for seizures, syncope, light-headedness and headaches.  Hematological: Negative for adenopathy.  Psychiatric/Behavioral: Positive for agitation and dysphoric mood. Negative for suicidal ideas. The patient is nervous/anxious.     Per HPI unless specifically indicated above     Objective:    BP (!) 136/108 (BP  Location: Left Arm, Patient Position: Sitting, Cuff Size: Normal)   Pulse 94   Temp 98.1 F (36.7 C) (Other (Comment))   Ht 5\' 2"  (1.575 m)   Wt 157 lb 6.4 oz (71.4 kg)   LMP 12/07/2015   SpO2 99%   BMI 28.79 kg/m   Wt Readings from Last 3 Encounters:  12/22/15 157 lb 6.4 oz (71.4 kg)  10/29/15 154 lb 8 oz (70.1 kg)  10/01/15 163 lb 3.2 oz (74 kg)    Physical Exam  Constitutional: She is oriented to person, place, and  time. She appears well-developed and well-nourished.  HENT:  Head: Normocephalic and atraumatic.  Neck: Neck supple.  Cardiovascular: Normal rate and regular rhythm.   Pulmonary/Chest: Effort normal and breath sounds normal.  Abdominal: Soft. Bowel sounds are normal. She exhibits no mass. There is no hepatosplenomegaly. There is no tenderness.  Musculoskeletal: She exhibits no edema.  Lymphadenopathy:    She has no cervical adenopathy.  Neurological: She is alert and oriented to person, place, and time.  Skin: Skin is warm and dry.  Psychiatric: She has a normal mood and affect. Her behavior is normal.  Vitals reviewed.   Results for orders placed or performed in visit on 12/16/15  Hepatitis C RNA quantitative  Result Value Ref Range   HCV Quantitative Not Detected <15 IU/mL   HCV Quantitative Log NOT CALC <1.18 log 10  Hepatic function panel  Result Value Ref Range   Total Bilirubin 0.5 0.2 - 1.2 mg/dL   Bilirubin, Direct 0.1 <=0.2 mg/dL   Indirect Bilirubin 0.4 0.2 - 1.2 mg/dL   Alkaline Phosphatase 75 33 - 115 U/L   AST 12 10 - 35 U/L   ALT 19 6 - 29 U/L   Total Protein 7.2 6.1 - 8.1 g/dL   Albumin 4.3 3.6 - 5.1 g/dL      Assessment & Plan:   Encounter Diagnoses  Name Primary?  . Essential hypertension Yes  . Hyperlipidemia, unspecified hyperlipidemia type   . Mood disorder (HCC)      -reviewed lab results with pt -pt to continue current medications -follow up in six weeks to monitor blood pressure and do PAP.  RTO sooner prn

## 2016-01-06 ENCOUNTER — Telehealth (HOSPITAL_COMMUNITY): Payer: Self-pay | Admitting: *Deleted

## 2016-01-06 NOTE — Telephone Encounter (Signed)
PATIENT RETURNED Beth Sanford'S PHONE [facility].

## 2016-01-07 ENCOUNTER — Encounter: Payer: Self-pay | Admitting: Physician Assistant

## 2016-01-07 ENCOUNTER — Ambulatory Visit: Payer: Self-pay | Admitting: Physician Assistant

## 2016-01-07 VITALS — BP 148/100 | HR 88 | Temp 98.1°F | Ht 62.0 in | Wt 154.8 lb

## 2016-01-07 DIAGNOSIS — R21 Rash and other nonspecific skin eruption: Secondary | ICD-10-CM

## 2016-01-07 MED ORDER — PREDNISONE 10 MG PO TABS: ORAL_TABLET | ORAL | 0 refills | Status: AC

## 2016-01-07 MED ORDER — HYDROXYZINE HCL 10 MG PO TABS: 10.0000 mg | ORAL_TABLET | Freq: Three times a day (TID) | ORAL | 0 refills | Status: DC | PRN

## 2016-01-07 NOTE — Patient Instructions (Signed)
Rash A rash is a change in the color of the skin. A rash can also change the way your skin feels. There are many different conditions and factors that can cause a rash. Follow these instructions at home: Pay attention to any changes in your symptoms. Follow these instructions to help with your condition: Medicine Take or apply over-the-counter and prescription medicines only as told by your health care provider. These may include:  Corticosteroid cream.  Anti-itch lotions.  Oral antihistamines.  Skin Care  Apply cool compresses to the affected areas.  Try taking a bath with: ? Epsom salts. Follow the instructions on the packaging. You can get these at your local pharmacy or grocery store. ? Baking soda. Pour a small amount into the bath as told by your health care provider. ? Colloidal oatmeal. Follow the instructions on the packaging. You can get this at your local pharmacy or grocery store.  Try applying baking soda paste to your skin. Stir water into baking soda until it reaches a paste-like consistency.  Do not scratch or rub your skin.  Avoid covering the rash. Make sure the rash is exposed to air as much as possible. General instructions  Avoid hot showers or baths, which can make itching worse. A cold shower may help.  Avoid scented soaps, detergents, and perfumes. Use gentle soaps, detergents, perfumes, and other cosmetic products.  Avoid any substance that causes your rash. Keep a journal to help track what causes your rash. Write down: ? What you eat. ? What cosmetic products you use. ? What you drink. ? What you wear. This includes jewelry.  Keep all follow-up visits as told by your health care provider. This is important. Contact a health care provider if:  You sweat at night.  You lose weight.  You urinate more than normal.  You feel weak.  You vomit.  Your skin or the whites of your eyes look yellow (jaundice).  Your skin: ? Tingles. ? Is  numb.  Your rash: ? Does not go away after several days. ? Gets worse.  You are: ? Unusually thirsty. ? More tired than normal.  You have: ? New symptoms. ? Pain in your abdomen. ? A fever. ? Diarrhea. Get help right away if:  You develop a rash that covers all or most of your body. The rash may or may not be painful.  You develop blisters that: ? Are on top of the rash. ? Grow larger or grow together. ? Are painful. ? Are inside your nose or mouth.  You develop a rash that: ? Looks like purple pinprick-sized spots all over your body. ? Has a "bull's eye" or looks like a target. ? Is not related to sun exposure, is red and painful, and causes your skin to peel. This information is not intended to replace advice given to you by your health care provider. Make sure you discuss any questions you have with your health care provider. Document Released: 12/11/2001 Document Revised: 05/27/2015 Document Reviewed: 05/08/2014 Elsevier Interactive Patient Education  2017 [employer]  

## 2016-01-07 NOTE — Telephone Encounter (Signed)
lmtcb on home number.unable to lm on mobile

## 2016-01-07 NOTE — Progress Notes (Signed)
BP (!) 148/100 (BP Location: Left Arm, Patient Position: Sitting, Cuff Size: Normal)   Pulse 88   Temp 98.1 F (36.7 C)   Ht 5\' 2"  (1.575 m)   Wt 154 lb 12 oz (70.2 kg)   LMP 12/07/2015   SpO2 98%   BMI 28.30 kg/m    Subjective:    Patient ID: Beth Sanford, female    DOB: 04/22/1970, 46 y.o.   MRN: [id number]  HPI: Beth Sanford is a 46 y.o. female presenting on 01/07/2016 for Rash (with itching on bilateral arms. pt states ongoing for 2 weeks. pt states it clears up and then flares back up)   HPI   States it clears up for a couple of days and then comes back.  It is ONLY on her arms.   Says it itches a lot.  She is using benedryl and calamine lotion  Pt denies changes in soaps, detergents, or new foods.    No SOB.   Pt does relate that she got a 'new to her' sofa for christmas from a thrift shop.  She says it looked good and she didn't think it looked like it would have scabies in it.  Relevant past medical, surgical, family and social history reviewed and updated as indicated. Interim medical history since our last visit reviewed. Allergies and medications reviewed and updated.   Current Outpatient Prescriptions:  .  ALPRAZolam (XANAX) 1 MG tablet, Take 1 tablet (1 mg total) by mouth 4 (four) times daily. (Patient taking differently: Take 1 mg by mouth 3 (three) times daily. ), Disp: 120 tablet, Rfl: 2 .  aspirin 81 MG chewable tablet, Chew 81 mg by mouth daily., Disp: , Rfl:  .  Ca Carbonate-Mag Hydroxide (ROLAIDS PO), Take 2 tablets by mouth daily as needed (heartburn)., Disp: , Rfl:  .  citalopram (CELEXA) 20 MG tablet, Take 1 tablet (20 mg total) by mouth 2 (two) times daily., Disp: 60 tablet, Rfl: 2 .  hydrALAZINE (APRESOLINE) 25 MG tablet, Take 1 tablet (25 mg total) by mouth 3 (three) times daily., Disp: 270 tablet, Rfl: 1 .  hydrochlorothiazide (MICROZIDE) 12.5 MG capsule, Take 1 capsule (12.5 mg total) by mouth daily., Disp: 90 capsule, Rfl: 3 .  lisinopril  (PRINIVIL,ZESTRIL) 20 MG tablet, Take 1 tab 2 times daily, Disp: 90 tablet, Rfl: 1 .  mirtazapine (REMERON) 30 MG tablet, Take 1 tablet (30 mg total) by mouth at bedtime., Disp: 30 tablet, Rfl: 2 .  Omega-3 Fatty Acids (FISH OIL PO), Take 1 tablet by mouth daily. , Disp: , Rfl:  .  ranolazine (RANEXA) 500 MG 12 hr tablet, Take 1 tablet (500 mg total) by mouth 2 (two) times daily., Disp: 60 tablet, Rfl: 6 .  verapamil (CALAN-SR) 240 MG CR tablet, Take 1 tablet (240 mg total) by mouth daily., Disp: 30 tablet, Rfl: 3   Review of Systems  Constitutional: Negative for appetite change, chills, diaphoresis, fatigue, fever and unexpected weight change.  HENT: Negative for congestion, dental problem, drooling, ear pain, facial swelling, hearing loss, mouth sores, sneezing, sore throat, trouble swallowing and voice change.   Eyes: Negative for pain, discharge, redness, itching and visual disturbance.  Respiratory: Negative for cough, choking, shortness of breath and wheezing.   Cardiovascular: Negative for chest pain, palpitations and leg swelling.  Gastrointestinal: Negative for abdominal pain, blood in stool, constipation, diarrhea and vomiting.  Endocrine: Negative for cold intolerance, heat intolerance and polydipsia.  Genitourinary: Negative for decreased urine volume, dysuria  and hematuria.  Musculoskeletal: Negative for arthralgias, back pain and gait problem.  Skin: Positive for rash.  Allergic/Immunologic: Positive for environmental allergies.  Neurological: Negative for seizures, syncope, light-headedness and headaches.  Hematological: Negative for adenopathy.  Psychiatric/Behavioral: Negative for agitation, dysphoric mood and suicidal ideas. The patient is not nervous/anxious.     Per HPI unless specifically indicated above     Objective:    BP (!) 148/100 (BP Location: Left Arm, Patient Position: Sitting, Cuff Size: Normal)   Pulse 88   Temp 98.1 F (36.7 C)   Ht 5\' 2"  (1.575 m)    Wt 154 lb 12 oz (70.2 kg)   LMP 12/07/2015   SpO2 98%   BMI 28.30 kg/m   Wt Readings from Last 3 Encounters:  01/07/16 154 lb 12 oz (70.2 kg)  12/22/15 157 lb 6.4 oz (71.4 kg)  10/29/15 154 lb 8 oz (70.1 kg)    Physical Exam  Constitutional: She is oriented to person, place, and time. She appears well-developed and well-nourished.  HENT:  Head: Normocephalic and atraumatic.  Pulmonary/Chest: Effort normal.  Neurological: She is alert and oriented to person, place, and time.  Skin: Skin is warm and dry. Rash noted. Rash is papular. Rash is not pustular and not vesicular.  Multiple excoriations noted, more on BUE than o torso or LE.  Rash is most pronounced on BUE but rash is also present on BLE with small, fait areas on torso A & P.  No secondary infection seen  Psychiatric: Her mood appears anxious. She is agitated and hyperactive. She expresses impulsivity.  Nursing note and vitals reviewed.      Assessment & Plan:   Encounter Diagnosis  Name Primary?  . Rash and nonspecific skin eruption Yes     -discussed with pt possible causes of rash including allergic dermatitis versus scabies.  Pt became extremely upset and didn't understand and didn't see how she could possibly have gotten that (scabies).  Discussed will treat for allergic dermatitis and she can [place] office of Monday to say whether she is improving.  Pt immediately calmed down and felt this was a good approach.  Pt says she has been on 'steroids' in the past and does fine with them.  rx prednisone taper and atarax to use prn itching.  Counseled pt on things to help itching and gave handout.  Pt reminded to [place] office on Monday to let us know how she is doing

## 2016-01-07 NOTE — Telephone Encounter (Signed)
Called pt mobile and mailbox is full.

## 2016-01-08 ENCOUNTER — Encounter (HOSPITAL_COMMUNITY): Payer: Self-pay | Admitting: Psychiatry

## 2016-01-08 ENCOUNTER — Ambulatory Visit (INDEPENDENT_AMBULATORY_CARE_PROVIDER_SITE_OTHER): Payer: Self-pay | Admitting: Psychiatry

## 2016-01-08 VITALS — BP 135/113 | HR 93 | Ht 62.0 in | Wt 150.4 lb

## 2016-01-08 DIAGNOSIS — Z888 Allergy status to other drugs, medicaments and biological substances status: Secondary | ICD-10-CM

## 2016-01-08 DIAGNOSIS — F411 Generalized anxiety disorder: Secondary | ICD-10-CM

## 2016-01-08 DIAGNOSIS — Z7982 Long term (current) use of aspirin: Secondary | ICD-10-CM

## 2016-01-08 DIAGNOSIS — F331 Major depressive disorder, recurrent, moderate: Secondary | ICD-10-CM

## 2016-01-08 DIAGNOSIS — Z79899 Other long term (current) drug therapy: Secondary | ICD-10-CM

## 2016-01-08 DIAGNOSIS — Z811 Family history of alcohol abuse and dependence: Secondary | ICD-10-CM

## 2016-01-08 DIAGNOSIS — Z8249 Family history of ischemic heart disease and other diseases of the circulatory system: Secondary | ICD-10-CM

## 2016-01-08 MED ORDER — CITALOPRAM HYDROBROMIDE 20 MG PO TABS: 20.0000 mg | ORAL_TABLET | Freq: Two times a day (BID) | ORAL | 2 refills | Status: DC

## 2016-01-08 MED ORDER — PERMETHRIN 5 % EX CREA: 1.0000 "application " | TOPICAL_CREAM | Freq: Once | CUTANEOUS | 0 refills | Status: AC

## 2016-01-08 MED ORDER — PERMETHRIN 5 % EX CREA: 1.0000 "application " | TOPICAL_CREAM | Freq: Once | CUTANEOUS | 0 refills | Status: DC

## 2016-01-08 MED ORDER — ALPRAZOLAM 1 MG PO TABS: 1.0000 mg | ORAL_TABLET | Freq: Three times a day (TID) | ORAL | 2 refills | Status: DC

## 2016-01-08 MED ORDER — MIRTAZAPINE 30 MG PO TABS: 30.0000 mg | ORAL_TABLET | Freq: Every day | ORAL | 2 refills | Status: DC

## 2016-01-08 NOTE — Telephone Encounter (Signed)
Called pt and use unable to reach pt. Unable to leave message due to no voicemail set up.
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
## 2016-01-12 ENCOUNTER — Ambulatory Visit: Payer: Self-pay | Admitting: Physician Assistant

## 2016-01-12 ENCOUNTER — Encounter: Payer: Self-pay | Admitting: Physician Assistant

## 2016-01-12 DIAGNOSIS — B86 Scabies: Secondary | ICD-10-CM

## 2016-01-12 MED ORDER — PERMETHRIN 5 % EX CREA: 1.0000 "application " | TOPICAL_CREAM | Freq: Once | CUTANEOUS | 0 refills | Status: AC

## 2016-01-12 NOTE — Patient Instructions (Addendum)
Tuesday- 4 tablets Wednesday- 3 tablets Thursday- 2 Friday -1   Scabies, Adult Introduction Scabies is a skin condition that happens when very small insects get under the skin (infestation). This causes a rash and severe itchiness. Scabies can spread from person to person (is contagious). If you get scabies, it is common for others in your household to get scabies too. With proper treatment, symptoms usually go away in 2-4 weeks. Scabies usually does not cause lasting problems. What are the causes? This condition is caused by mites (Sarcoptes scabiei, or human itch mites) that can only be seen with a microscope. The mites get into the top layer of skin and lay eggs. Scabies can spread from person to person through:  Close contact with a person who has scabies.  Contact with infested items, such as towels, bedding, or clothing. What increases the risk? This condition is more likely to develop in:  People who live in nursing homes and other extended-care facilities.  People who have sexual contact with a partner who has scabies.  Young children who attend child care facilities.  People who care for others who are at increased risk for scabies. What are the signs or symptoms? Symptoms of this condition may include:  Severe itchiness. This is often worse at night.  A rash that includes tiny red bumps or blisters. The rash commonly occurs on the wrist, elbow, armpit, fingers, waist, groin, or buttocks. Bumps may form a line (burrow) in some areas.  Skin irritation. This can include scaly patches or sores. How is this diagnosed? This condition is diagnosed with a physical exam. Your health care provider will look closely at your skin. In some cases, your health care provider may take a sample of your affected skin (skin scraping) and have it examined under a microscope. How is this treated? This condition may be treated with:  Medicated cream or lotion that kills the mites. This is  spread on the entire body and left on for several hours. Usually, one treatment with medicated cream or lotion is enough to kill all of the mites. In severe cases, the treatment may be repeated.  Medicated cream that relieves itching.  Medicines that help to relieve itching.  Medicines that kill the mites. This treatment is rarely used. Follow these instructions at home:   Medicines  Take or apply over-the-counter and prescription medicines as told by your health care provider.  Apply medicated cream or lotion as told by your health care provider.  Do not wash off the medicated cream or lotion until the necessary amount of time has passed. Skin Care  Avoid scratching your affected skin.  Keep your fingernails closely trimmed to reduce injury from scratching.  Take cool baths or apply cool washcloths to help reduce itching. General instructions  Clean all items that you recently had contact with, including bedding, clothing, and furniture. Do this on the same day that your treatment starts.  Use hot water when you wash items.  Place unwashable items into closed, airtight plastic bags for at least 3 days. The mites cannot live for more than 3 days away from human skin.  Vacuum furniture and mattresses that you use.  Make sure that other people who may have been infested are examined by a health care provider. These include members of your household and anyone who may have had contact with infested items.  Keep all follow-up visits as told by your health care provider. This is important. Contact a health care provider  if:  You have itching that does not go away after 4 weeks of treatment.  You continue to develop new bumps or burrows.  You have redness, swelling, or pain in your rash area after treatment.  You have fluid, blood, or pus coming from your rash. This information is not intended to replace advice given to you by your health care provider. Make sure you discuss  any questions you have with your health care provider. Document Released: 09/11/2014 Document Revised: 05/29/2015 Document Reviewed: 07/23/2014  2017 Elsevier

## 2016-01-12 NOTE — Progress Notes (Signed)
   LMP 12/07/2015    Subjective:    Patient ID: Beth Sanford, female    DOB: 11/06/1970, 46 y.o.   MRN: [id number]  HPI: Beth Sanford is a 46 y.o. female presenting on 01/12/2016 for No chief complaint on file.   HPI   Pt in stating not much improvement in her rash.  She says she discussed with her therapist on Friday who agreed it looked like scabies.  She got Rx but said it was around $100 which she can't afford.  Pt still very anxious and upset about dx.   Pt asks about prednisone- she took 5 this morning  Relevant past medical, surgical, family and social history reviewed and updated as indicated. Interim medical history since our last visit reviewed. Allergies and medications reviewed and updated.  Review of Systems  Constitutional: Negative for appetite change, chills, diaphoresis, fatigue, fever and unexpected weight change.  HENT: Negative for congestion, drooling, ear pain, facial swelling, hearing loss, mouth sores, sneezing, sore throat, trouble swallowing and voice change.   Eyes: Negative for pain, discharge, redness, itching and visual disturbance.  Respiratory: Negative for cough, choking, shortness of breath and wheezing.   Cardiovascular: Negative for chest pain, palpitations and leg swelling.  Gastrointestinal: Negative for abdominal pain, blood in stool, constipation, diarrhea and vomiting.  Endocrine: Negative for cold intolerance, heat intolerance and polydipsia.  Genitourinary: Negative for decreased urine volume, dysuria and hematuria.  Musculoskeletal: Negative for arthralgias, back pain and gait problem.  Skin: Positive for rash.  Allergic/Immunologic: Negative for environmental allergies.  Neurological: Negative for seizures, syncope, light-headedness and headaches.  Hematological: Negative for adenopathy.  Psychiatric/Behavioral: Negative for agitation, dysphoric mood and suicidal ideas. The patient is nervous/anxious.     Per HPI unless specifically  indicated above     Objective:     Vitals deferred due to rash  LMP 12/07/2015   Wt Readings from Last 3 Encounters:  01/07/16 154 lb 12 oz (70.2 kg)  12/22/15 157 lb 6.4 oz (71.4 kg)  10/29/15 154 lb 8 oz (70.1 kg)    Physical Exam  Constitutional: She is oriented to person, place, and time. She appears well-developed and well-nourished.  HENT:  Head: Normocephalic and atraumatic.  Pulmonary/Chest: Effort normal.  Neurological: She is alert and oriented to person, place, and time.  Skin: Rash noted.  No notable change from last week  Psychiatric: Thought content normal. Her mood appears anxious. Her speech is rapid and/or pressured. Cognition and memory are normal.        Assessment & Plan:   Encounter Diagnosis  Name Primary?  . Scabies Yes     -reassured pt about scabies.  Gave handout with reading information. -rx elemite and gave coupon -gave pt instructions to taper off prednisone quicker.  She can continue the atarax as needed -follow up later this month as scheduled.  RTO sooner prn

## 2016-01-13 ENCOUNTER — Ambulatory Visit (INDEPENDENT_AMBULATORY_CARE_PROVIDER_SITE_OTHER): Payer: Self-pay | Admitting: Psychology

## 2016-01-13 DIAGNOSIS — F331 Major depressive disorder, recurrent, moderate: Secondary | ICD-10-CM

## 2016-01-13 DIAGNOSIS — F411 Generalized anxiety disorder: Secondary | ICD-10-CM

## 2016-01-14 NOTE — Progress Notes (Signed)
PROGRESS NOTE  Patient:  Beth MinerSheree C Gharibian   DOB: 07/22/1970  MR Number: [id number]  Location: [facility] [address] [place] [id number] Dept: [phone]  Start: 2 PM End: 3 PM  Provider/Observer:     Hershal CoriaJohn R Dawsyn Ramsaran PSYD  Chief Complaint:      Chief Complaint  Patient presents with  . Agitation  . Anxiety  . Depression    Reason For Service:     The patient was referred by Dr. Tenny Crawoss for psychotherapeutic interventions. The patient reports that she has had significant troubles with depression for many years. She reports that she had been seen Dr. Tiburcio PeaHarris for psychiatric services as well as a counselor and like them both by Dr. Tibur[facility]Harris [place] [facility] [place] [facility]. She then tried a new psychiatrist and did not particularly related to them very well and stopped seeing him. She is now return in and started seeing Dr. Tenny Crawoss. The patient reports that she's been without her medications for depression and anxiety for some time. The patient reports that she has started back on her psychotropic medications and has been feeling better. However, she reports that she continues to be "stressed out" and has recurrent panic attacks. The patient also significant medical issues including congestive heart disease and COPD. The patient reports that while she has been feeling better she has continued to have a lot of anxiety and depression. Reports that she is in the process of quitting smoking cigarettes and has only been using E. cigarette/vapor and is also trying to stop that as well. The patient reports that there continues to be a lot of stress between her and her long-time boyfriend and she reports that he has not been time for related to work very well at all.  Interventions Strategy:  Cognitive/behavioral psychotherapeutic interventions  Participation Level:   Active  Participation  Quality:  Appropriate      Behavioral Observation:  Well Groomed, Alert, and Appropriate.   Current Psychosocial Factors: The patient reports that she has contineud to do better with mother, but she got some second hand furniture from her mother for xmas and it ended up leading her to be exposed to a mite infection that has really stressed the patient and left her mother defensive and angry that the patient was upset.  The patient has been working on Baristacoping skills etc.      Content of Session:   Review current symptoms and work on therapeutic interventions for issues related to recurrent depression as well as anxiety.  Current Status:   The patient reports that she is doing better with depression and anxeity.  Here BP continues to be high.  She [facility]  She has accepted that fact that I am leaving this clinic and she will start seeing someone else.   Patient Progress:   Stable with improvement initially from psychotropic interventions.  Target Goals:   Target goals include reducing the intensity, severity, and duration of symptoms of depression including feelings of hopelessness and helplessness, social isolation, and withdrawal from others. The patient reports that she would also like to work specifically on reducing the frequency and intensity of her anxiety and panic-like symptoms  Last Reviewed:   01/13/2016  Goals Addressed Today:    Today we worked on Producer, television/film/videobuilding coping skills and strategies utilizing the cognitive approach around her interpretation of her situation  in her life.  Impression/Diagnosis:   The patient has a long history of recurrent symptoms of depression without psychotic features. She also describes significant symptoms of anxiety and possible panic attack.  Diagnosis:    Axis I: Major depressive disorder, recurrent episode, moderate (HCC)  Generalized anxiety disorder      Kenn Rekowski R, PsyD 01/14/2016

## 2016-01-27 ENCOUNTER — Other Ambulatory Visit: Payer: Self-pay | Admitting: Student

## 2016-01-27 ENCOUNTER — Encounter: Payer: Self-pay | Admitting: Physician Assistant

## 2016-01-27 ENCOUNTER — Ambulatory Visit: Payer: Self-pay | Admitting: Physician Assistant

## 2016-01-27 VITALS — BP 152/96 | HR 79 | Temp 97.7°F | Ht 62.0 in | Wt 147.0 lb

## 2016-01-27 DIAGNOSIS — I10 Essential (primary) hypertension: Secondary | ICD-10-CM

## 2016-01-27 DIAGNOSIS — R21 Rash and other nonspecific skin eruption: Secondary | ICD-10-CM

## 2016-01-27 DIAGNOSIS — E785 Hyperlipidemia, unspecified: Secondary | ICD-10-CM

## 2016-01-27 NOTE — Progress Notes (Signed)
   BP (!) 152/96 (BP Location: Left Arm, Patient Position: Sitting, Cuff Size: Normal)   Pulse 79   Temp 97.7 F (36.5 C)   Ht 5\' 2"  (1.575 m)   Wt 147 lb (66.7 kg)   SpO2 98%   BMI 26.89 kg/m    Subjective:    Patient ID: Beth Sanford, female    DOB: 01/16/1970, 46 y.o.   MRN: [id number]  HPI: Beth Sanford is a [age] y.o. female presenting on 01/27/2016 for Rash   HPI   Pt has been treated with prednisone taper and with elemite.  States no improvement in rash.  C/o it's very itchy  Relevant past medical, surgical, family and social history reviewed and updated as indicated. Interim medical history since our last visit reviewed. Allergies and medications reviewed and updated.  Review of Systems  Constitutional: Positive for fatigue. Negative for appetite change, chills, diaphoresis, fever and unexpected weight change.  HENT: Negative for congestion, dental problem, drooling, ear pain, facial swelling, hearing loss, mouth sores, sneezing, sore throat, trouble swallowing and voice change.   Eyes: Negative for pain, discharge, redness, itching and visual disturbance.  Respiratory: Negative for cough, choking, shortness of breath and wheezing.   Cardiovascular: Negative for chest pain, palpitations and leg swelling.  Gastrointestinal: Negative for abdominal pain, blood in stool, constipation, diarrhea and vomiting.  Endocrine: Positive for heat intolerance. Negative for cold intolerance and polydipsia.  Genitourinary: Negative for decreased urine volume, dysuria and hematuria.  Musculoskeletal: Negative for arthralgias, back pain and gait problem.  Skin: Positive for rash.  Allergic/Immunologic: Negative for environmental allergies.  Neurological: Negative for seizures, syncope, light-headedness and headaches.  Hematological: Negative for adenopathy.  Psychiatric/Behavioral: Positive for agitation and dysphoric mood. Negative for suicidal ideas. The patient is nervous/anxious.      Per HPI unless specifically indicated above     Objective:    BP (!) 152/96 (BP Location: Left Arm, Patient Position: Sitting, Cuff Size: Normal)   Pulse 79   Temp 97.7 F (36.5 C)   Ht 5\' 2"  (1.575 m)   Wt 147 lb (66.7 kg)   SpO2 98%   BMI 26.89 kg/m   Wt Readings from Last 3 Encounters:  01/27/16 147 lb (66.7 kg)  01/07/16 154 lb 12 oz (70.2 kg)  12/22/15 157 lb 6.4 oz (71.4 kg)    Physical Exam  Constitutional: She is oriented to person, place, and time. She appears well-developed and well-nourished.  HENT:  Head: Normocephalic and atraumatic.  Pulmonary/Chest: Effort normal.  Neurological: She is alert and oriented to person, place, and time.  Skin: Rash noted.  Rash all extremities and trunk.  No obvious improvement or worsening since treatment with prednisone and elemite  Psychiatric: She has a normal mood and affect. Her behavior is normal.  Nursing note and vitals reviewed.       Assessment & Plan:    Encounter Diagnosis  Name Primary?  . Rash and nonspecific skin eruption Yes    Refer to dermatology Follow up as scheduled

## 2016-01-28 LAB — COMPREHENSIVE METABOLIC PANEL
ALBUMIN: 4.2 g/dL (ref 3.6–5.1)
ALT: 26 U/L (ref 6–29)
AST: 23 U/L (ref 10–35)
Alkaline Phosphatase: 68 U/L (ref 33–115)
BUN: 9 mg/dL (ref 7–25)
CALCIUM: 8.9 mg/dL (ref 8.6–10.2)
CHLORIDE: 106 mmol/L (ref 98–110)
CO2: 21 mmol/L (ref 20–31)
CREATININE: 0.72 mg/dL (ref 0.50–1.10)
Glucose, Bld: 85 mg/dL (ref 65–99)
POTASSIUM: 4.3 mmol/L (ref 3.5–5.3)
SODIUM: 139 mmol/L (ref 135–146)
Total Bilirubin: 0.6 mg/dL (ref 0.2–1.2)
Total Protein: 6.7 g/dL (ref 6.1–8.1)

## 2016-01-28 LAB — LIPID PANEL
CHOL/HDL RATIO: 3.5 ratio (ref <5.0)
CHOLESTEROL: 176 mg/dL (ref <200)
HDL: 51 mg/dL (ref >50)
LDL CALC: 99 mg/dL (ref <100)
TRIGLYCERIDES: 131 mg/dL (ref <150)
VLDL: 26 mg/dL (ref <30)

## 2016-02-02 ENCOUNTER — Ambulatory Visit: Payer: Self-pay | Admitting: Physician Assistant

## 2016-02-04 ENCOUNTER — Other Ambulatory Visit (HOSPITAL_COMMUNITY): Payer: Self-pay | Admitting: Psychiatry

## 2016-02-04 ENCOUNTER — Encounter (HOSPITAL_COMMUNITY): Payer: Self-pay | Admitting: *Deleted

## 2016-02-04 ENCOUNTER — Telehealth (HOSPITAL_COMMUNITY): Payer: Self-pay | Admitting: *Deleted

## 2016-02-04 DIAGNOSIS — F331 Major depressive disorder, recurrent, moderate: Secondary | ICD-10-CM

## 2016-02-04 MED ORDER — ALPRAZOLAM 1 MG PO TABS: 1.0000 mg | ORAL_TABLET | Freq: Four times a day (QID) | ORAL | 2 refills | Status: DC

## 2016-02-04 NOTE — Telephone Encounter (Signed)
Reprinted for qid, please shred old script

## 2016-02-04 NOTE — Progress Notes (Signed)
Pt came into office to switch out her scripts from sig TID to QID. Pt D/L number is [id number] and expiration date of 11-11-24. Pt script is Xanax 1 mg QID. Script ID is [id number] and order ID number is [id number]. Pt showed understanding. Pt old script (Xanax 1 mg TID) order ID number is [id number] and script ID number is [id number].

## 2016-02-04 NOTE — Telephone Encounter (Signed)
Pt walked into office with printed script for her Xanax stating Dr. Tenny Crawoss have the wrong SIG. Per pt she thought she was suppose to be talking her Xanax QID but the printed script is stating TID. Per pt is this suppose to be and if not, could Dr. Tenny Crawoss reprint another script with SIG QID for her. Pt is currently in the lobby.

## 2016-02-04 NOTE — Telephone Encounter (Signed)
Script was given to pt.

## 2016-02-16 ENCOUNTER — Encounter: Payer: Self-pay | Admitting: Physician Assistant

## 2016-02-16 ENCOUNTER — Ambulatory Visit: Payer: Self-pay | Admitting: Physician Assistant

## 2016-02-16 VITALS — BP 140/96 | HR 78 | Temp 97.3°F | Ht 62.0 in | Wt 149.5 lb

## 2016-02-16 DIAGNOSIS — E785 Hyperlipidemia, unspecified: Secondary | ICD-10-CM

## 2016-02-16 DIAGNOSIS — F39 Unspecified mood [affective] disorder: Secondary | ICD-10-CM

## 2016-02-16 DIAGNOSIS — R21 Rash and other nonspecific skin eruption: Secondary | ICD-10-CM

## 2016-02-16 DIAGNOSIS — I10 Essential (primary) hypertension: Secondary | ICD-10-CM

## 2016-02-16 DIAGNOSIS — B86 Scabies: Secondary | ICD-10-CM

## 2016-02-16 MED ORDER — HYDROXYZINE HCL 10 MG PO TABS: 10.0000 mg | ORAL_TABLET | Freq: Three times a day (TID) | ORAL | 0 refills | Status: DC | PRN

## 2016-02-16 MED ORDER — METOPROLOL TARTRATE 25 MG PO TABS: 25.0000 mg | ORAL_TABLET | Freq: Two times a day (BID) | ORAL | 1 refills | Status: DC

## 2016-02-16 NOTE — Progress Notes (Signed)
BP (!) 140/96 (BP Location: Left Arm, Patient Position: Sitting, Cuff Size: Normal)   Pulse 78   Temp 97.3 F (36.3 C)   Ht 5\' 2"  (1.575 m)   Wt 149 lb 8 oz (67.8 kg)   SpO2 98%   BMI 27.34 kg/m    Subjective:    Patient ID: Beth Sanford, female    DOB: 09/06/1970, 46 y.o.   MRN: [id number]  HPI: Beth Sanford is a 46 y.o. female presenting on 02/16/2016 for Hypertension and Gynecologic Exam (pt states on period, began 02-14-16.)   HPI    Pt still upset about scabies which dermatology confirmed.  Pt says she still has itching. She has lots of anxiety about the scabies but is otherwise doing well.  Relevant past medical, surgical, family and social history reviewed and updated as indicated. Interim medical history since our last visit reviewed. Allergies and medications reviewed and updated.   Current Outpatient Prescriptions:  .  ALPRAZolam (XANAX) 1 MG tablet, Take 1 tablet (1 mg total) by mouth 4 (four) times daily., Disp: 120 tablet, Rfl: 2 .  aspirin 81 MG chewable tablet, Chew 81 mg by mouth daily., Disp: , Rfl:  .  Ca Carbonate-Mag Hydroxide (ROLAIDS PO), Take 2 tablets by mouth daily as needed (heartburn)., Disp: , Rfl:  .  citalopram (CELEXA) 20 MG tablet, Take 1 tablet (20 mg total) by mouth 2 (two) times daily., Disp: 60 tablet, Rfl: 2 .  hydrALAZINE (APRESOLINE) 25 MG tablet, Take 1 tablet (25 mg total) by mouth 3 (three) times daily., Disp: 270 tablet, Rfl: 1 .  hydrochlorothiazide (MICROZIDE) 12.5 MG capsule, Take 1 capsule (12.5 mg total) by mouth daily., Disp: 90 capsule, Rfl: 3 .  lisinopril (PRINIVIL,ZESTRIL) 20 MG tablet, Take 1 tab 2 times daily, Disp: 90 tablet, Rfl: 1 .  mirtazapine (REMERON) 30 MG tablet, Take 1 tablet (30 mg total) by mouth at bedtime., Disp: 30 tablet, Rfl: 2 .  Omega-3 Fatty Acids (FISH OIL PO), Take 1 tablet by mouth daily. , Disp: , Rfl:  .  ranolazine (RANEXA) 500 MG 12 hr tablet, Take 1 tablet (500 mg total) by mouth 2 (two) times  daily. (Patient taking differently: Take 500 mg by mouth as needed. ), Disp: 60 tablet, Rfl: 6 .  verapamil (CALAN-SR) 240 MG CR tablet, Take 1 tablet (240 mg total) by mouth daily., Disp: 30 tablet, Rfl: 3 .  hydrOXYzine (ATARAX/VISTARIL) 10 MG tablet, Take 1 tablet (10 mg total) by mouth 3 (three) times daily as needed. (Patient not taking: Reported on 02/16/2016), Disp: 30 tablet, Rfl: 0   Review of Systems  Constitutional: Negative for appetite change, chills, diaphoresis, fatigue, fever and unexpected weight change.  HENT: Negative for congestion, dental problem, drooling, ear pain, facial swelling, hearing loss, mouth sores, sneezing, sore throat, trouble swallowing and voice change.   Eyes: Negative for pain, discharge, redness, itching and visual disturbance.  Respiratory: Negative for cough, choking, shortness of breath and wheezing.   Cardiovascular: Negative for chest pain, palpitations and leg swelling.  Gastrointestinal: Negative for abdominal pain, blood in stool, constipation, diarrhea and vomiting.  Endocrine: Positive for heat intolerance. Negative for cold intolerance and polydipsia.  Genitourinary: Negative for decreased urine volume, dysuria and hematuria.  Musculoskeletal: Negative for arthralgias, back pain and gait problem.  Skin: Positive for rash.  Allergic/Immunologic: Negative for environmental allergies.  Neurological: Negative for seizures, syncope, light-headedness and headaches.  Hematological: Negative for adenopathy.  Psychiatric/Behavioral: Positive for agitation and  dysphoric mood. Negative for suicidal ideas. The patient is nervous/anxious.     Per HPI unless specifically indicated above     Objective:    BP (!) 140/96 (BP Location: Left Arm, Patient Position: Sitting, Cuff Size: Normal)   Pulse 78   Temp 97.3 F (36.3 C)   Ht 5\' 2"  (1.575 m)   Wt 149 lb 8 oz (67.8 kg)   SpO2 98%   BMI 27.34 kg/m   Wt Readings from Last 3 Encounters:  02/16/16  149 lb 8 oz (67.8 kg)  01/27/16 147 lb (66.7 kg)  01/07/16 154 lb 12 oz (70.2 kg)    Physical Exam  Constitutional: She is oriented to person, place, and time. She appears well-developed and well-nourished.  HENT:  Head: Normocephalic and atraumatic.  Neck: Neck supple.  Cardiovascular: Normal rate and regular rhythm.   Pulmonary/Chest: Effort normal and breath sounds normal.  Musculoskeletal: She exhibits no edema.  Lymphadenopathy:    She has no cervical adenopathy.  Neurological: She is alert and oriented to person, place, and time.  Skin: Skin is warm and dry.  Psychiatric: She has a normal mood and affect. Her behavior is normal.  Vitals reviewed.   Results for orders placed or performed in visit on 01/27/16  Comprehensive metabolic panel  Result Value Ref Range   Sodium 139 135 - 146 mmol/L   Potassium 4.3 3.5 - 5.3 mmol/L   Chloride 106 98 - 110 mmol/L   CO2 21 20 - 31 mmol/L   Glucose, Bld 85 65 - 99 mg/dL   BUN 9 7 - 25 mg/dL   Creat 1.61 0.96 - 0.45 mg/dL   Total Bilirubin 0.6 0.2 - 1.2 mg/dL   Alkaline Phosphatase 68 33 - 115 U/L   AST 23 10 - 35 U/L   ALT 26 6 - 29 U/L   Total Protein 6.7 6.1 - 8.1 g/dL   Albumin 4.2 3.6 - 5.1 g/dL   Calcium 8.9 8.6 - 40.9 mg/dL  Lipid panel  Result Value Ref Range   Cholesterol 176 <200 mg/dL   Triglycerides 811 <914 mg/dL   HDL 51 >78 mg/dL   Total CHOL/HDL Ratio 3.5 <5.0 Ratio   VLDL 26 <30 mg/dL   LDL Cholesterol 99 <295 mg/dL      Assessment & Plan:   Encounter Diagnoses  Name Primary?  . Essential hypertension Yes  . Hyperlipidemia, unspecified hyperlipidemia type   . Scabies   . Rash and nonspecific skin eruption   . Mood disorder (HCC)     -reviewed labs with pt -continue current medications -Add metoprolol -Refilled vistaril for itching -F/u 3 wk recheck bp and do PAP

## 2016-03-08 ENCOUNTER — Ambulatory Visit: Payer: Self-pay | Admitting: Physician Assistant

## 2016-03-10 ENCOUNTER — Encounter: Payer: Self-pay | Admitting: Physician Assistant

## 2016-04-07 ENCOUNTER — Ambulatory Visit (INDEPENDENT_AMBULATORY_CARE_PROVIDER_SITE_OTHER): Payer: Self-pay | Admitting: Psychiatry

## 2016-04-07 ENCOUNTER — Encounter (HOSPITAL_COMMUNITY): Payer: Self-pay | Admitting: Psychiatry

## 2016-04-07 VITALS — BP 98/82 | HR 80 | Ht 62.0 in | Wt 147.4 lb

## 2016-04-07 DIAGNOSIS — F331 Major depressive disorder, recurrent, moderate: Secondary | ICD-10-CM

## 2016-04-07 DIAGNOSIS — Z811 Family history of alcohol abuse and dependence: Secondary | ICD-10-CM

## 2016-04-07 DIAGNOSIS — F411 Generalized anxiety disorder: Secondary | ICD-10-CM

## 2016-04-07 DIAGNOSIS — Z7982 Long term (current) use of aspirin: Secondary | ICD-10-CM

## 2016-04-07 DIAGNOSIS — Z79899 Other long term (current) drug therapy: Secondary | ICD-10-CM

## 2016-04-07 DIAGNOSIS — Z818 Family history of other mental and behavioral disorders: Secondary | ICD-10-CM

## 2016-04-07 MED ORDER — CITALOPRAM HYDROBROMIDE 20 MG PO TABS
20.0000 mg | ORAL_TABLET | Freq: Two times a day (BID) | ORAL | 2 refills | Status: DC
Start: 2016-04-07 — End: 2016-07-08

## 2016-04-07 MED ORDER — HYDROXYZINE HCL 10 MG PO TABS: 10.0000 mg | ORAL_TABLET | Freq: Three times a day (TID) | ORAL | 2 refills | Status: DC | PRN

## 2016-04-07 MED ORDER — ALPRAZOLAM 1 MG PO TABS: 1.0000 mg | ORAL_TABLET | Freq: Four times a day (QID) | ORAL | 2 refills | Status: DC

## 2016-04-07 MED ORDER — MIRTAZAPINE 30 MG PO TABS: 30.0000 mg | ORAL_TABLET | Freq: Every day | ORAL | 2 refills | Status: DC
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
## 2016-06-08 ENCOUNTER — Telehealth (HOSPITAL_COMMUNITY): Payer: Self-pay | Admitting: *Deleted

## 2016-06-08 NOTE — Telephone Encounter (Signed)
Called pt and lmtcb to resch her appt for June 25th due to provider being out of office. Office number provided on voicemail

## 2016-06-10 ENCOUNTER — Telehealth (HOSPITAL_COMMUNITY): Payer: Self-pay | Admitting: *Deleted

## 2016-06-10 NOTE — Telephone Encounter (Signed)
left voice message, provider out of office 06/28/16. 

## 2016-06-23 ENCOUNTER — Telehealth (HOSPITAL_COMMUNITY): Payer: Self-pay | Admitting: *Deleted

## 2016-06-23 NOTE — Telephone Encounter (Signed)
Phone Beth Sanford from patient she would like Octavia to [place] her regarding her refills,.

## 2016-06-24 ENCOUNTER — Telehealth (HOSPITAL_COMMUNITY): Payer: Self-pay | Admitting: *Deleted

## 2016-06-24 NOTE — Telephone Encounter (Signed)
voice message from patient, said she is returning [place]avia's phone [place].

## 2016-06-24 NOTE — Telephone Encounter (Signed)
lmtcb

## 2016-06-25 NOTE — Telephone Encounter (Signed)
lmtcb

## 2016-06-28 ENCOUNTER — Ambulatory Visit (HOSPITAL_COMMUNITY): Payer: Self-pay | Admitting: Psychiatry

## 2016-06-28 ENCOUNTER — Ambulatory Visit (HOSPITAL_COMMUNITY)
Admission: EM | Admit: 2016-06-28 | Discharge: 2016-06-28 | Disposition: A | Discharge status: Home / Self Care | Payer: Self-pay | Attending: Family Medicine | Admitting: Family Medicine

## 2016-06-28 ENCOUNTER — Encounter (HOSPITAL_COMMUNITY): Payer: Self-pay | Admitting: Emergency Medicine

## 2016-06-28 DIAGNOSIS — R21 Rash and other nonspecific skin eruption: Secondary | ICD-10-CM

## 2016-06-28 DIAGNOSIS — L0100 Impetigo, unspecified: Secondary | ICD-10-CM

## 2016-06-28 LAB — POCT URINALYSIS DIP (DEVICE)
Bilirubin Urine: NEGATIVE
Glucose, UA: NEGATIVE mg/dL
Ketones, ur: NEGATIVE mg/dL
Leukocytes, UA: NEGATIVE
Nitrite: NEGATIVE
Protein, ur: NEGATIVE mg/dL
Specific Gravity, Urine: >=1.03 (ref 1.005–1.030)
Urobilinogen, UA: 0.2 mg/dL (ref 0.0–1.0)
pH: 5.5 (ref 5.0–8.0)

## 2016-06-28 LAB — POCT I-STAT, CHEM 8
BUN: 9 mg/dL (ref 6–20)
Calcium, Ion: 1.18 mmol/L (ref 1.15–1.40)
Chloride: 100 mmol/L — ABNORMAL LOW (ref 101–111)
Creatinine, Ser: 0.7 mg/dL (ref 0.44–1.00)
Glucose, Bld: 83 mg/dL (ref 65–99)
HEMATOCRIT: 48 % — AB (ref 36.0–46.0)
HEMOGLOBIN: 16.3 g/dL — AB (ref 12.0–15.0)
Potassium: 4.7 mmol/L (ref 3.5–5.1)
SODIUM: 139 mmol/L (ref 135–145)
TCO2: 28 mmol/L (ref 0–100)

## 2016-06-28 MED ORDER — METHYLPREDNISOLONE ACETATE 40 MG/ML IJ SUSP
40.0000 mg | Freq: Once | INTRAMUSCULAR | Status: AC
  Administered 2016-06-28: 40 mg via INTRAMUSCULAR

## 2016-06-28 MED ORDER — ESZOPICLONE 3 MG PO TABS: 3.0000 mg | ORAL_TABLET | Freq: Every day | ORAL | 0 refills | Status: DC

## 2016-06-28 MED ORDER — DEXAMETHASONE SODIUM PHOSPHATE 10 MG/ML IJ SOLN
10.0000 mg | Freq: Once | INTRAMUSCULAR | Status: AC
  Administered 2016-06-28: 10 mg via INTRAMUSCULAR

## 2016-06-28 MED ORDER — AMOXICILLIN-POT CLAVULANATE 875-125 MG PO TABS: 1.0000 | ORAL_TABLET | Freq: Two times a day (BID) | ORAL | 0 refills | Status: DC

## 2016-06-28 MED ORDER — DEXAMETHASONE SODIUM PHOSPHATE 10 MG/ML IJ SOLN
INTRAMUSCULAR | Status: AC
  Filled 2016-06-28: qty 1

## 2016-06-28 MED ORDER — METHYLPREDNISOLONE ACETATE 40 MG/ML IJ SUSP
INTRAMUSCULAR | Status: AC
  Filled 2016-06-28: qty 1

## 2016-06-28 NOTE — Discharge Instructions (Signed)
We are uncertain as to the exact type rash that you have. It is possible that it is infectious such as impetigo or possibly a variant of psoriasis. You have been treated with high-dose steroids by injection and a prescription for an antibiotic. You have also been given a few pills to help you sleep. This should not be taken with other medications that she would have a can cause drowsiness or sleepiness such as Xanax. Dinius the dermatologist on this page foreign appointment tomorrow. If you are getting worse, sickly, develop a fever, the rash is getting much worse, more painful or itchy then he will need to seek medical attention promptly. He may need to go to the emergency department.

## 2016-06-28 NOTE — ED Triage Notes (Signed)
Pt here for a rash all over body onset 5 days .... Voices no other concerns  Denies new foods/meds/hygiene product   A&O x4... NAD... Ambulatory

## 2016-06-28 NOTE — ED Provider Notes (Signed)
CSN: [id number]     Arrival date & time 06/28/16  1630 History   First MD Initiated Contact with Patient 06/28/16 1807     Chief Complaint  Patient presents with  . Rash   (Consider location/radiation/quality/duration/timing/severity/associated sxs/prior Treatment) 46 year old female presents to the urgent care after developing a painful itchy rash 4 days ago. These are roughly annular lesions/patches primarily to the upper torso and lesser to the lower torso, upper and lower extremities. They are painful and pruritic. Denies any kind of "sickness". States she feels well otherwise. No fever, chills, sore throat, URI or viral type symptoms. She also states that a few weeks ago she had smaller papules scattered about her extremities initially diagnosed as scabies and treated as such then later diagnosed with bedbug bites. Several of these papules remain densely populated on the extremities.      Past Medical History:  Diagnosis Date  . Anxiety   . CHF (congestive heart failure) (HCC)   . Depression   . High cholesterol   . Hypertension   . OSA (obstructive sleep apnea) 09/13/2014   Past Surgical History:  Procedure Laterality Date  . CHOLECYSTECTOMY    . ELBOW SURGERY  left   Family History  Problem Relation Age of Onset  . Alcohol abuse Father   . Heart failure Father        deceased at age 23  . Hypertension Brother   . Bipolar disorder Brother   . Emphysema Maternal Grandfather   . Colon cancer Neg Hx    Social History  Substance Use Topics  . Smoking status: Former Smoker    Packs/day: 0.50    Years: 27.00    Types: Cigarettes    Start date: 01/05/1984    Quit date: 01/05/2012  . Smokeless tobacco: Never Used  . Alcohol use No   OB History    Gravida Para Term Preterm AB Living   3 1     2      SAB TAB Ectopic Multiple Live Births                 Review of Systems  Constitutional: Positive for activity change. Negative for chills and fever.  HENT: Negative.     Respiratory: Negative.   Cardiovascular: Negative.   Gastrointestinal: Negative.   Musculoskeletal: Negative.   Skin: Positive for rash.  Neurological: Negative.   All other systems reviewed and are negative.   Allergies  Codeine and Tramadol  Home Medications   Prior to Admission medications   Medication Sig Start Date End Date Taking? Authorizing Provider  ALPRAZolam Prudy Feeler) 1 MG tablet Take 1 tablet (1 mg total) by mouth 4 (four) times daily. 04/07/16  Yes Myrlene Broker, MD  aspirin 81 MG chewable tablet Chew 81 mg by mouth daily.   Yes [provider]  Ca Carbonate-Mag Hydroxide (ROLAIDS PO) Take 2 tablets by mouth daily as needed (heartburn).   Yes [provider]  citalopram (CELEXA) 20 MG tablet Take 1 tablet (20 mg total) by mouth 2 (two) times daily. 04/07/16 04/07/17 Yes Myrlene Broker, MD  hydrALAZINE (APRESOLINE) 25 MG tablet Take 1 tablet (25 mg total) by mouth 3 (three) times daily. 10/02/15  Yes Jacquelin Hawking, PA-C  hydrochlorothiazide (MICROZIDE) 12.5 MG capsule Take 1 capsule (12.5 mg total) by mouth daily. 10/29/15  Yes Jacquelin Hawking, PA-C  hydrOXYzine (ATARAX/VISTARIL) 10 MG tablet Take 1 tablet (10 mg total) by mouth 3 (three) times daily as needed. 04/07/16  Yes Myrlene Brokeross, Deborah R, MD  lisinopril (PRINIVIL,ZESTRIL) 20 MG tablet Take 1 tab 2 times daily 10/02/15  Yes Jacquelin HawkingMcElroy, Shannon, PA-C  metoprolol tartrate (LOPRESSOR) 25 MG tablet Take 1 tablet (25 mg total) by mouth 2 (two) times daily. 02/16/16  Yes Jacquelin HawkingMcElroy, Shannon, PA-C  mirtazapine (REMERON) 30 MG tablet Take 1 tablet (30 mg total) by mouth at bedtime. 04/07/16 04/07/17 Yes Myrlene Brokeross, Deborah R, MD  Omega-3 Fatty Acids (FISH OIL PO) Take 1 tablet by mouth daily.    Yes [provider]  ranolazine (RANEXA) 500 MG 12 hr tablet Take 1 tablet (500 mg total) by mouth 2 (two) times daily. Patient taking differently: Take 500 mg by mouth as needed.  02/18/15  Yes Jodelle GrossLawrence, Kathryn M, NP  verapamil  (CALAN-SR) 240 MG CR tablet Take 1 tablet (240 mg total) by mouth daily. 08/18/15  Yes Laqueta LindenKoneswaran, Suresh A, MD  amoxicillin-clavulanate (AUGMENTIN) 875-125 MG tablet Take 1 tablet by mouth every 12 (twelve) hours. 06/28/16   Hayden RasmussenMabe, Rashana Andrew, NP  Eszopiclone 3 MG TABS Take 1 tablet (3 mg total) by mouth at bedtime. Take immediately before bedtime 06/28/16   Hayden RasmussenMabe, Keniya Schlotterbeck, NP   Meds Ordered and Administered this Visit   Medications  methylPREDNISolone acetate (DEPO-MEDROL) injection 40 mg (40 mg Intramuscular Given 06/28/16 1851)  dexamethasone (DECADRON) injection 10 mg (10 mg Intramuscular Given 06/28/16 1850)    BP (!) 175/116 (BP Location: Left Arm)   Pulse 75   Temp 98 F (36.7 C) (Oral)   Resp 18   SpO2 100%  No data found.   Physical Exam  Constitutional: She is oriented to person, place, and time. She appears well-developed and well-nourished.  HENT:  Mouth/Throat: Oropharynx is clear and moist.  Eyes: EOM are normal.  Neck: Normal range of motion. Neck supple.  Cardiovascular: Normal rate.   Pulmonary/Chest: Effort normal.  Musculoskeletal: Normal range of motion. She exhibits no edema.  Neurological: She is alert and oriented to person, place, and time.  Skin: Skin is warm and dry. She is not diaphoretic.  Rough textured annular or ovoid-like plaque lesions distributed as described in history of present illness. Erythematous and scaly. Tender.  Nursing note and vitals reviewed.   Urgent Care Course     Procedures (including critical care time)  Labs Review Labs Reviewed  POCT I-STAT, CHEM 8 - Abnormal; Notable for the following:       Result Value   Chloride 100 (*)    Hemoglobin 16.3 (*)    HCT 48.0 (*)    All other components within normal limits  POCT URINALYSIS DIP (DEVICE) - Abnormal; Notable for the following:    Hgb urine dipstick TRACE (*)    All other components within normal limits    Imaging Review No results found.         Visual Acuity  Review  Right Eye Distance:   Left Eye Distance:   Bilateral Distance:    Right Eye Near:   Left Eye Near:    Bilateral Near:         MDM   1. Rash and nonspecific skin eruption   2. Impetigo    Consulted with Dr. Milus GlazierLauenstein, who also saw the patient. We are uncertain as to the exact type rash that you have. It is possible that it is infectious such as impetigo or possibly a variant of psoriasis. You have been treated with high-dose steroids by injection and a prescription for an antibiotic. You have also been given  a few pills to help you sleep. This should not be taken with other medications that she would have a can cause drowsiness or sleepiness such as Xanax. [place] the dermatologist on this page foreign appointment tomorrow. If you are getting worse, sickly, develop a fever, the rash is getting much worse, more painful or itchy then he will need to seek medical attention promptly. He may need to go to the emergency department. Meds ordered this encounter  Medications  . methylPREDNISolone acetate (DEPO-MEDROL) injection 40 mg  . dexamethasone (DECADRON) injection 10 mg  . amoxicillin-clavulanate (AUGMENTIN) 875-125 MG tablet    Sig: Take 1 tablet by mouth every 12 (twelve) hours.    Dispense:  14 tablet    Refill:  0    Order Specific Question:   Supervising Provider    Answer:   Elvina Sidle [[id number]]  . Eszopiclone 3 MG TABS    Sig: Take 1 tablet (3 mg total) by mouth at bedtime. Take immediately before bedtime    Dispense:  6 tablet    Refill:  0    Order Specific Question:   Supervising Provider    Answer:   Elvina Sidle [[id number]]       Hayden Rasmussen, NP 06/28/16 1934

## 2016-07-03 ENCOUNTER — Other Ambulatory Visit (HOSPITAL_COMMUNITY): Payer: Self-pay | Admitting: Psychiatry

## 2016-07-03 DIAGNOSIS — F331 Major depressive disorder, recurrent, moderate: Secondary | ICD-10-CM

## 2016-07-08 ENCOUNTER — Ambulatory Visit (INDEPENDENT_AMBULATORY_CARE_PROVIDER_SITE_OTHER): Payer: Self-pay | Admitting: Psychiatry

## 2016-07-08 ENCOUNTER — Encounter (HOSPITAL_COMMUNITY): Payer: Self-pay | Admitting: Psychiatry

## 2016-07-08 VITALS — BP 144/90 | HR 97 | Ht 62.0 in | Wt 144.0 lb

## 2016-07-08 DIAGNOSIS — F411 Generalized anxiety disorder: Secondary | ICD-10-CM

## 2016-07-08 DIAGNOSIS — F331 Major depressive disorder, recurrent, moderate: Secondary | ICD-10-CM

## 2016-07-08 DIAGNOSIS — Z811 Family history of alcohol abuse and dependence: Secondary | ICD-10-CM

## 2016-07-08 MED ORDER — HYDROXYZINE HCL 10 MG PO TABS: 10.0000 mg | ORAL_TABLET | Freq: Three times a day (TID) | ORAL | 2 refills | Status: DC | PRN

## 2016-07-08 MED ORDER — MIRTAZAPINE 30 MG PO TABS: 30.0000 mg | ORAL_TABLET | Freq: Every day | ORAL | 2 refills | Status: DC

## 2016-07-08 MED ORDER — CITALOPRAM HYDROBROMIDE 20 MG PO TABS: 20.0000 mg | ORAL_TABLET | Freq: Two times a day (BID) | ORAL | 2 refills | Status: DC

## 2016-07-08 MED ORDER — ALPRAZOLAM 1 MG PO TABS: 1.0000 mg | ORAL_TABLET | Freq: Four times a day (QID) | ORAL | 2 refills | Status: DC
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
## 2016-07-09 NOTE — Telephone Encounter (Signed)
Pt came in for her appt.

## 2016-07-26 ENCOUNTER — Ambulatory Visit (HOSPITAL_COMMUNITY): Payer: Self-pay | Admitting: Licensed Clinical Social Worker

## 2016-08-17 ENCOUNTER — Other Ambulatory Visit: Payer: Self-pay | Admitting: Physician Assistant

## 2016-08-24 ENCOUNTER — Ambulatory Visit (HOSPITAL_COMMUNITY): Payer: Self-pay | Admitting: Licensed Clinical Social Worker

## 2016-09-16 ENCOUNTER — Encounter (HOSPITAL_COMMUNITY): Payer: Self-pay

## 2016-09-16 ENCOUNTER — Emergency Department (HOSPITAL_COMMUNITY)
Admission: EM | Admit: 2016-09-16 | Discharge: 2016-09-16 | Disposition: A | Discharge status: Home / Self Care | Payer: Self-pay | Attending: Emergency Medicine | Admitting: Emergency Medicine

## 2016-09-16 DIAGNOSIS — R21 Rash and other nonspecific skin eruption: Secondary | ICD-10-CM | POA: Insufficient documentation

## 2016-09-16 DIAGNOSIS — Z79899 Other long term (current) drug therapy: Secondary | ICD-10-CM | POA: Insufficient documentation

## 2016-09-16 DIAGNOSIS — I110 Hypertensive heart disease with heart failure: Secondary | ICD-10-CM | POA: Insufficient documentation

## 2016-09-16 DIAGNOSIS — Z87891 Personal history of nicotine dependence: Secondary | ICD-10-CM | POA: Insufficient documentation

## 2016-09-16 DIAGNOSIS — I509 Heart failure, unspecified: Secondary | ICD-10-CM | POA: Insufficient documentation

## 2016-09-16 MED ORDER — PREDNISONE 10 MG PO TABS: 10.0000 mg | ORAL_TABLET | Freq: Every day | ORAL | 0 refills | Status: DC

## 2016-09-16 MED ORDER — PERMETHRIN 5 % EX CREA: TOPICAL_CREAM | CUTANEOUS | 0 refills | Status: DC

## 2016-09-16 NOTE — ED Triage Notes (Signed)
Pt endorses rash to both arms and legs that she has been dealing with since last October. Her apartment was treated for bedbugs back in July and was told that she has no more bed bugs. Pt states that there was an epidemic at her apartment of bed bugs. Pt has bites to both arms and legs. NAD.

## 2016-09-16 NOTE — ED Notes (Signed)
Declined W/C at D/C and was escorted to lobby by RN. 

## 2016-09-16 NOTE — Discharge Instructions (Signed)
Apply Permethrin cream once Take steroid once daily for 5 days in the morning Have apartment checked for bed bugs if your symptoms are not improving Follow up with dermatology

## 2016-09-16 NOTE — ED Provider Notes (Signed)
[facility]-EMERGENCY DEPT Provider Note   CSN: [id number] Arrival date & time: 09/16/16  1831     History   Chief Complaint Chief Complaint  Patient presents with  . Rash  . bed bugs    HPI Beth Sanford is a 46 y.o. female who presents with a rash. PMH significant for CHF, anxiety/depression, HLD, HTN. She states that she has developed a rash on her arms and legs over the past three days. She has had a similar rash last year and was first misdiagnosed with scabies and was treated with Permethrin 4 times and this did not help. She then had her apartment treated for bed bugs and this did help. She has not had a rash until the past couple days. She was seen at [facility] several months ago for a rash on her back but it was different than this and resolved. She has been washing her bedding and used several "bug bombs" in her apartment. The rash is very itchy. No fevers, new soaps, lotions, detergents.   HPI  Past Medical History:  Diagnosis Date  . Anxiety   . CHF (congestive heart failure) (HCC)   . Depression   . High cholesterol   . Hypertension   . OSA (obstructive sleep apnea) 09/13/2014    Patient Active Problem List   Diagnosis Date Noted  . Abnormal LFTs 09/16/2015  . Tobacco abuse 12/18/2014  . OSA (obstructive sleep apnea) 09/13/2014  . Major depression 06/08/2013  . Chest pain 11/01/2012  . Essential hypertension, malignant 11/01/2012  . Hyperlipidemia 11/01/2012    Past Surgical History:  Procedure Laterality Date  . CHOLECYSTECTOMY    . ELBOW SURGERY  left    OB History    Gravida Para Term Preterm AB Living   SAB TAB Ectopic Multiple Live Births                   Home Medications    Prior to Admission medications   Medication Sig Start Date End Date Taking? Authorizing Provider  ALPRAZolam Prudy Feeler) 1 MG tablet Take 1 tablet (1 mg total) by mouth 4 (four) times daily. 07/08/16   Myrlene Broker, MD  amoxicillin-clavulanate (AUGMENTIN) 875-125 MG  tablet Take 1 tablet by mouth every 12 (twelve) hours. 06/28/16   Hayden Rasmussen, NP  aspirin 81 MG chewable tablet Chew 81 mg by mouth daily.    [provider]  Ca Carbonate-Mag Hydroxide (ROLAIDS PO) Take 2 tablets by mouth daily as needed (heartburn).    [provider]  citalopram (CELEXA) 20 MG tablet Take 1 tablet (20 mg total) by mouth 2 (two) times daily. 07/08/16 07/08/17  Myrlene Broker, MD  Eszopiclone 3 MG TABS Take 1 tablet (3 mg total) by mouth at bedtime. Take immediately before bedtime 06/28/16   Hayden Rasmussen, NP  hydrALAZINE (APRESOLINE) 25 MG tablet Take 1 tablet (25 mg total) by mouth 3 (three) times daily. 10/02/15   Jacquelin Hawking, PA-C  hydrochlorothiazide (MICROZIDE) 12.5 MG capsule Take 1 capsule (12.5 mg total) by mouth daily. 10/29/15   Jacquelin Hawking, PA-C  hydrOXYzine (ATARAX/VISTARIL) 10 MG tablet Take 1 tablet (10 mg total) by mouth 3 (three) times daily as needed. 07/08/16   Myrlene Broker, MD  lisinopril (PRINIVIL,ZESTRIL) 20 MG tablet Take 1 tab 2 times daily 10/02/15   Jacquelin Hawking, PA-C  metoprolol tartrate (LOPRESSOR) 25 MG tablet Take 1 tablet (25 mg total) by mouth  2 (two) times daily. 02/16/16   Jacquelin Hawking, PA-C  mirtazapine (REMERON) 30 MG tablet Take 1 tablet (30 mg total) by mouth at bedtime. 07/08/16 07/08/17  Myrlene Broker, MD  Omega-3 Fatty Acids (FISH OIL PO) Take 1 tablet by mouth daily.     [provider]  permethrin (ELIMITE) 5 % cream Apply to body from shoulders down and leave on over night. Wash off in the morning. 09/16/16   Bethel Born, PA-C  predniSONE (DELTASONE) 10 MG tablet Take 1 tablet (10 mg total) by mouth daily. 09/16/16   Bethel Born, PA-C  ranolazine (RANEXA) 500 MG 12 hr tablet Take 1 tablet (500 mg total) by mouth 2 (two) times daily. Patient taking differently: Take 500 mg by mouth as needed.  02/18/15   Jodelle Gross, NP  verapamil (CALAN-SR) 240 MG CR tablet Take 1 tablet (240 mg total)  by mouth daily. 08/18/15   Laqueta Linden, MD    Family History Family History  Problem Relation Age of Onset  . Alcohol abuse Father   . Heart failure Father        deceased at age 4  . Hypertension Brother   . Bipolar disorder Brother   . Emphysema Maternal Grandfather   . Colon cancer Neg Hx     Social History Social History  Substance Use Topics  . Smoking status: Former Smoker    Packs/day: 0.50    Years: 27.00    Types: Cigarettes    Start date: 01/05/1984    Quit date: 01/05/2012  . Smokeless tobacco: Never Used  . Alcohol use No     Allergies   Codeine and Tramadol   Review of Systems Review of Systems  Constitutional: Negative for fever.  Skin: Positive for rash.     Physical Exam Updated Vital Signs BP (!) 155/110 (BP Location: Right Arm)   Pulse 80   Temp 98 F (36.7 C) (Oral)   Resp 16   Ht 5' 2.5" (1.588 m)   Wt 64.9 kg (143 lb)   LMP 09/06/2016 (Exact Date)   SpO2 99%   BMI 25.74 kg/m   Physical Exam  Constitutional: She is oriented to person, place, and time. She appears well-developed and well-nourished. No distress.  HENT:  Head: Normocephalic and atraumatic.  Eyes: Pupils are equal, round, and reactive to light. Conjunctivae are normal. Right eye exhibits no discharge. Left eye exhibits no discharge. No scleral icterus.  Neck: Normal range of motion.  Cardiovascular: Normal rate.   Pulmonary/Chest: Effort normal. No respiratory distress.  Abdominal: She exhibits no distension.  Neurological: She is alert and oriented to person, place, and time.  Skin: Skin is warm and dry. Rash (generalized papular rash on the arms and legs with excoriations) noted.  Psychiatric: She has a normal mood and affect. Her behavior is normal.  Nursing note and vitals reviewed.      ED Treatments / Results  Labs (all labs ordered are listed, but only abnormal results are displayed) Labs Reviewed - No data to display  EKG  EKG  Interpretation None       Radiology No results found.  Procedures Procedures (including critical care time)  Medications Ordered in ED Medications - No data to display   Initial Impression / Assessment and Plan / ED Course  I have reviewed the triage vital signs and the nursing notes.  Pertinent labs & imaging results that were available during my care of the patient were  reviewed by me and considered in my medical decision making (see chart for details).  46 year old female presents with a rash. She is hypertensive but otherwise vitals are normal. Unclear etiology of rash. It looks like scabies although she states that in the past she was treated with Permethin and it didn't help. I advised her to try this once and will rx short course of steroids. If she does not get relief from this to [place] her apartment manager and have her home rechecked for bed bugs. Return precautions given.  Final Clinical Impressions(s) / ED Diagnoses   Final diagnoses:  Rash and nonspecific skin eruption    New Prescriptions New Prescriptions   PERMETHRIN (ELIMITE) 5 % CREAM    Apply to body from shoulders down and leave on over night. Wash off in the morning.   PREDNISONE (DELTASONE) 10 MG TABLET    Take 1 tablet (10 mg total) by mouth daily.     Bethel BornGekas, Deloy Archey Marie, PA-C 09/16/16 Andy Gauss2000    Pickering, Nathan, MD 09/17/16 [phone]

## 2016-10-04 ENCOUNTER — Ambulatory Visit: Payer: Self-pay | Admitting: Physician Assistant

## 2016-10-04 ENCOUNTER — Telehealth (HOSPITAL_COMMUNITY): Payer: Self-pay | Admitting: *Deleted

## 2016-10-04 ENCOUNTER — Ambulatory Visit (HOSPITAL_COMMUNITY): Payer: Self-pay | Admitting: Psychiatry

## 2016-10-04 NOTE — Telephone Encounter (Signed)
LEFT VOICE MESSAGE AT BOTH PHONE NUMBERS, PROVIDER OUT OF OFFICE TODAY.

## 2016-10-20 ENCOUNTER — Other Ambulatory Visit: Payer: Self-pay | Admitting: *Deleted

## 2016-10-20 MED ORDER — RANOLAZINE ER 500 MG PO TB12: 500.0000 mg | ORAL_TABLET | Freq: Two times a day (BID) | ORAL | 0 refills | Status: DC

## 2016-10-25 ENCOUNTER — Telehealth (HOSPITAL_COMMUNITY): Payer: Self-pay | Admitting: *Deleted

## 2016-10-25 ENCOUNTER — Other Ambulatory Visit: Payer: Self-pay | Admitting: Physician Assistant

## 2016-10-25 NOTE — Telephone Encounter (Signed)
noted 

## 2016-10-25 NOTE — Telephone Encounter (Signed)
No, this has to be done by primary care, she can go to the ED

## 2016-10-25 NOTE — Telephone Encounter (Signed)
Pt walked into office stating she went to the free clinic and they had given her an ear drops. Per pt she got the ear drops due to an ear infection. Per pt she ran out and they informed her that due to her appt not being until November, they will not be able to refill them. Per pt she was wondering if Dr. Tenny Crawoss could refill her ear drops until she goes back to the free clinic. Per pt she do not have PCP and Dr.Ross filled a cream for her before in the past. Pt number [phone]. Name is Neo/Poly/HC OTIC 1% with 4 drops in Rt ear QID.

## 2016-10-27 NOTE — Telephone Encounter (Signed)
Called pt at her home number mobile number and was unable to reach her due to number not accept phone calls at this time or number being d/c. Called pt mother number [phone] and mother picked up. Informed mother to please have pt Beth Sanford staff and office number was provided. Per pt mother she will have pt Beth Sanford office.

## 2016-10-29 NOTE — Telephone Encounter (Signed)
Pt walked into office and stated she did get office message. Informed pt with what provider stated and pt verbalized understanding.

## 2016-11-04 ENCOUNTER — Ambulatory Visit (INDEPENDENT_AMBULATORY_CARE_PROVIDER_SITE_OTHER): Payer: Self-pay | Admitting: Psychiatry

## 2016-11-04 ENCOUNTER — Encounter (HOSPITAL_COMMUNITY): Payer: Self-pay | Admitting: Psychiatry

## 2016-11-04 VITALS — BP 186/116 | HR 72 | Ht 62.5 in | Wt 151.0 lb

## 2016-11-04 DIAGNOSIS — F331 Major depressive disorder, recurrent, moderate: Secondary | ICD-10-CM

## 2016-11-04 DIAGNOSIS — F411 Generalized anxiety disorder: Secondary | ICD-10-CM

## 2016-11-04 MED ORDER — MIRTAZAPINE 30 MG PO TABS: 30.0000 mg | ORAL_TABLET | Freq: Every day | ORAL | 2 refills | Status: DC

## 2016-11-04 MED ORDER — ALPRAZOLAM 1 MG PO TABS: 1.0000 mg | ORAL_TABLET | Freq: Four times a day (QID) | ORAL | 2 refills | Status: DC

## 2016-11-04 MED ORDER — CITALOPRAM HYDROBROMIDE 20 MG PO TABS: 20.0000 mg | ORAL_TABLET | Freq: Two times a day (BID) | ORAL | 2 refills | Status: DC
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
## 2016-12-31 ENCOUNTER — Other Ambulatory Visit (HOSPITAL_COMMUNITY): Payer: Self-pay | Admitting: Psychiatry

## 2016-12-31 DIAGNOSIS — F331 Major depressive disorder, recurrent, moderate: Secondary | ICD-10-CM

## 2017-01-31 ENCOUNTER — Ambulatory Visit (INDEPENDENT_AMBULATORY_CARE_PROVIDER_SITE_OTHER): Payer: Self-pay | Admitting: Psychiatry

## 2017-01-31 ENCOUNTER — Encounter (HOSPITAL_COMMUNITY): Payer: Self-pay | Admitting: Psychiatry

## 2017-01-31 DIAGNOSIS — R079 Chest pain, unspecified: Secondary | ICD-10-CM

## 2017-01-31 DIAGNOSIS — Z87891 Personal history of nicotine dependence: Secondary | ICD-10-CM

## 2017-01-31 DIAGNOSIS — Z560 Unemployment, unspecified: Secondary | ICD-10-CM

## 2017-01-31 DIAGNOSIS — F419 Anxiety disorder, unspecified: Secondary | ICD-10-CM

## 2017-01-31 DIAGNOSIS — F410 Panic disorder [episodic paroxysmal anxiety] without agoraphobia: Secondary | ICD-10-CM

## 2017-01-31 DIAGNOSIS — R450 Nervousness: Secondary | ICD-10-CM

## 2017-01-31 DIAGNOSIS — F331 Major depressive disorder, recurrent, moderate: Secondary | ICD-10-CM

## 2017-01-31 DIAGNOSIS — Z91410 Personal history of adult physical and sexual abuse: Secondary | ICD-10-CM

## 2017-01-31 DIAGNOSIS — Z811 Family history of alcohol abuse and dependence: Secondary | ICD-10-CM

## 2017-01-31 DIAGNOSIS — Z818 Family history of other mental and behavioral disorders: Secondary | ICD-10-CM

## 2017-01-31 MED ORDER — CITALOPRAM HYDROBROMIDE 20 MG PO TABS: 20.0000 mg | ORAL_TABLET | Freq: Two times a day (BID) | ORAL | 2 refills | Status: DC

## 2017-01-31 MED ORDER — ALPRAZOLAM 1 MG PO TABS: 1.0000 mg | ORAL_TABLET | Freq: Four times a day (QID) | ORAL | 2 refills | Status: DC

## 2017-01-31 MED ORDER — MIRTAZAPINE 30 MG PO TABS: 30.0000 mg | ORAL_TABLET | Freq: Every day | ORAL | 2 refills | Status: DC
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
## 2017-03-07 ENCOUNTER — Ambulatory Visit: Payer: Self-pay | Admitting: Physician Assistant

## 2017-03-07 DIAGNOSIS — R0989 Other specified symptoms and signs involving the circulatory and respiratory systems: Secondary | ICD-10-CM

## 2017-03-07 NOTE — Progress Notes (Deleted)
Cardiology Office Note    Date:  03/07/2017   ID:  Beth Sanford, DOB 01-30-1970, MRN [id number]  PCP:  Patient, No Pcp Per  Cardiologist: No primary care provider on file.  No chief complaint on file.   History of Present Illness:  Beth Sanford is a 47 y.o. female with history of hypertension who does not always take her medications because of affordability, chronic chest pain for which she was previously on Ranexa.  Last saw by Dr. Purvis Sheffield 2017.  Patient also has anxiety depression, HLD and tobacco abuse, OSA on CPAP.  2D echo 2017 moderate LVH EF 65-70% with grade 1 DD.    Past Medical History:  Diagnosis Date  . Anxiety   . CHF (congestive heart failure) (HCC)   . Depression   . High cholesterol   . Hypertension   . OSA (obstructive sleep apnea) 09/13/2014    Past Surgical History:  Procedure Laterality Date  . CHOLECYSTECTOMY    . ELBOW SURGERY  left    Current Medications: No outpatient medications have been marked as taking for the 03/07/17 encounter (Appointment) with Dyann Kief, PA-C.     Allergies:   Codeine and Tramadol   Social History   Socioeconomic History  . Marital status: Single    Spouse name: Not on file  . Number of children: Not on file  . Years of education: Not on file  . Highest education level: Not on file  Social Needs  . Financial resource strain: Not on file  . Food insecurity - worry: Not on file  . Food insecurity - inability: Not on file  . Transportation needs - medical: Not on file  . Transportation needs - non-medical: Not on file  Occupational History  . Not on file  Tobacco Use  . Smoking status: Former Smoker    Packs/day: 0.50    Years: 27.00    Pack years: 13.50    Types: Cigarettes    Start date: 01/05/1984    Last attempt to quit: 01/05/2012    Years since quitting: 5.1  . Smokeless tobacco: Never Used  Substance and Sexual Activity  . Alcohol use: No    Alcohol/week: 0.0 oz  . Drug use: No  . Sexual  activity: Not Currently    Birth control/protection: None  Other Topics Concern  . Not on file  Social History Narrative  . Not on file     Family History:  The patient's family history includes Alcohol abuse in her father; Bipolar disorder in her brother; Emphysema in her maternal grandfather; Heart failure in her father; Hypertension in her brother.   ROS:   Please see the history of present illness.    ROS All other systems reviewed and are negative.   PHYSICAL EXAM:   VS:  There were no vitals taken for this visit.  Physical Exam  GEN: Well nourished, well developed, in no acute distress  HEENT: normal  Neck: no JVD, carotid bruits, or masses Cardiac:RRR; no murmurs, rubs, or gallops  Respiratory:  clear to auscultation bilaterally, normal work of breathing GI: soft, nontender, nondistended, + BS Ext: without cyanosis, clubbing, or edema, Good distal pulses bilaterally MS: no deformity or atrophy  Skin: warm and dry, no rash Neuro:  Alert and Oriented x 3, Strength and sensation are intact Psych: euthymic mood, full affect  Wt Readings from Last 3 Encounters:  09/16/16 143 lb (64.9 kg)  02/16/16 149 lb 8 oz (67.8 kg)  01/27/16 147 lb (66.7 kg)      Studies/Labs Reviewed:   EKG:  EKG is*** ordered today.  The ekg ordered today demonstrates ***  Recent Labs: 06/28/2016: BUN 9; Creatinine, Ser 0.70; Hemoglobin 16.3; Potassium 4.7; Sodium 139   Lipid Panel    Component Value Date/Time   CHOL 176 01/27/2016 1528   TRIG 131 01/27/2016 1528   HDL 51 01/27/2016 1528   CHOLHDL 3.5 01/27/2016 1528   VLDL 26 01/27/2016 1528   LDLCALC 99 01/27/2016 1528    Additional studies/ records that were reviewed today include:  2D echo 9/2017Study Conclusions   - Left ventricle: The cavity size was normal. Wall thickness was   increased in a pattern of moderate LVH. Systolic function was   vigorous. The estimated ejection fraction was in the range of 65%   to 70%. Wall  motion was normal; there were no regional wall   motion abnormalities. Doppler parameters are consistent with   abnormal left ventricular relaxation (grade 1 diastolic   dysfunction). - Aortic valve: Mildly calcified annulus. Trileaflet; normal   thickness leaflets. Valve area (VTI): 2.72 cm^2. Valve area   (Vmax): 2.58 cm^2. - Mitral valve: Mildly calcified annulus. Mildly thickened leaflets   . - Technically adequate study.       ASSESSMENT:    1. Essential hypertension, malignant   2. Hyperlipidemia, unspecified hyperlipidemia type   3. OSA (obstructive sleep apnea)   4. Tobacco abuse      PLAN:  In order of problems listed above:   Essential hypertension  Hyperlipidemia  OSA on CPAP  Tobacco abuse   Medication Adjustments/Labs and Tests Ordered: Current medicines are reviewed at length with the patient today.  Concerns regarding medicines are outlined above.  Medication changes, Labs and Tests ordered today are listed in the Patient Instructions below. There are no Patient Instructions on file for this visit.   Elson ClanSigned, Michele Lenze, PA-C  03/07/2017 10:25 AM    [facility] [address], [place], [place]  [address] Phone: [phone]) [phone]; Fax: [phone]) [phone]

## 2017-03-08 ENCOUNTER — Ambulatory Visit: Payer: Self-pay | Admitting: Cardiovascular Disease

## 2017-05-02 ENCOUNTER — Encounter (HOSPITAL_COMMUNITY): Payer: Self-pay | Admitting: Psychiatry

## 2017-05-02 ENCOUNTER — Ambulatory Visit (INDEPENDENT_AMBULATORY_CARE_PROVIDER_SITE_OTHER): Payer: Self-pay | Admitting: Psychiatry

## 2017-05-02 DIAGNOSIS — F419 Anxiety disorder, unspecified: Secondary | ICD-10-CM

## 2017-05-02 DIAGNOSIS — Z818 Family history of other mental and behavioral disorders: Secondary | ICD-10-CM

## 2017-05-02 DIAGNOSIS — Z560 Unemployment, unspecified: Secondary | ICD-10-CM

## 2017-05-02 DIAGNOSIS — Z87891 Personal history of nicotine dependence: Secondary | ICD-10-CM

## 2017-05-02 DIAGNOSIS — Z811 Family history of alcohol abuse and dependence: Secondary | ICD-10-CM

## 2017-05-02 DIAGNOSIS — F331 Major depressive disorder, recurrent, moderate: Secondary | ICD-10-CM

## 2017-05-02 DIAGNOSIS — G4700 Insomnia, unspecified: Secondary | ICD-10-CM

## 2017-05-02 MED ORDER — CITALOPRAM HYDROBROMIDE 20 MG PO TABS: 20.0000 mg | ORAL_TABLET | Freq: Two times a day (BID) | ORAL | 2 refills | Status: DC

## 2017-05-02 MED ORDER — ALPRAZOLAM 1 MG PO TABS: 1.0000 mg | ORAL_TABLET | Freq: Four times a day (QID) | ORAL | 2 refills | Status: DC

## 2017-05-02 MED ORDER — TRAZODONE HCL 50 MG PO TABS
50.0000 mg | ORAL_TABLET | Freq: Every day | ORAL | 2 refills | Status: DC
Start: 2017-05-02 — End: 2017-08-02
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
## 2017-08-01 ENCOUNTER — Ambulatory Visit (HOSPITAL_COMMUNITY): Payer: Self-pay | Admitting: Psychiatry

## 2017-08-02 ENCOUNTER — Ambulatory Visit (INDEPENDENT_AMBULATORY_CARE_PROVIDER_SITE_OTHER): Payer: Self-pay | Admitting: Psychiatry

## 2017-08-02 ENCOUNTER — Encounter (HOSPITAL_COMMUNITY): Payer: Self-pay | Admitting: Psychiatry

## 2017-08-02 DIAGNOSIS — Z811 Family history of alcohol abuse and dependence: Secondary | ICD-10-CM

## 2017-08-02 DIAGNOSIS — Z87891 Personal history of nicotine dependence: Secondary | ICD-10-CM

## 2017-08-02 DIAGNOSIS — F331 Major depressive disorder, recurrent, moderate: Secondary | ICD-10-CM

## 2017-08-02 MED ORDER — TRAZODONE HCL 50 MG PO TABS: 50.0000 mg | ORAL_TABLET | Freq: Every day | ORAL | 2 refills | Status: DC

## 2017-08-02 MED ORDER — CITALOPRAM HYDROBROMIDE 20 MG PO TABS: 20.0000 mg | ORAL_TABLET | Freq: Two times a day (BID) | ORAL | 2 refills | Status: DC

## 2017-08-02 MED ORDER — ALPRAZOLAM 1 MG PO TABS: 1.0000 mg | ORAL_TABLET | Freq: Four times a day (QID) | ORAL | 2 refills | Status: DC
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
## 2017-09-29 ENCOUNTER — Other Ambulatory Visit (HOSPITAL_COMMUNITY): Payer: Self-pay | Admitting: Psychiatry

## 2017-09-29 DIAGNOSIS — F331 Major depressive disorder, recurrent, moderate: Secondary | ICD-10-CM

## 2017-10-03 ENCOUNTER — Emergency Department (HOSPITAL_COMMUNITY): Payer: Self-pay

## 2017-10-03 ENCOUNTER — Encounter (HOSPITAL_COMMUNITY): Payer: Self-pay | Admitting: Emergency Medicine

## 2017-10-03 ENCOUNTER — Other Ambulatory Visit: Payer: Self-pay

## 2017-10-03 ENCOUNTER — Emergency Department (HOSPITAL_COMMUNITY)
Admission: EM | Admit: 2017-10-03 | Discharge: 2017-10-03 | Disposition: A | Discharge status: Home / Self Care | Payer: Self-pay | Attending: Emergency Medicine | Admitting: Emergency Medicine

## 2017-10-03 DIAGNOSIS — R079 Chest pain, unspecified: Secondary | ICD-10-CM | POA: Insufficient documentation

## 2017-10-03 DIAGNOSIS — R05 Cough: Secondary | ICD-10-CM | POA: Insufficient documentation

## 2017-10-03 DIAGNOSIS — Z5321 Procedure and treatment not carried out due to patient leaving prior to being seen by health care provider: Secondary | ICD-10-CM | POA: Insufficient documentation

## 2017-10-03 LAB — CBC
HEMATOCRIT: 45.1 % (ref 36.0–46.0)
HEMOGLOBIN: 14.7 g/dL (ref 12.0–15.0)
MCH: 31.5 pg (ref 26.0–34.0)
MCHC: 32.6 g/dL (ref 30.0–36.0)
MCV: 96.6 fL (ref 78.0–100.0)
Platelets: 262 10*3/uL (ref 150–400)
RBC: 4.67 MIL/uL (ref 3.87–5.11)
RDW: 13 % (ref 11.5–15.5)
WBC: 8.4 10*3/uL (ref 4.0–10.5)

## 2017-10-03 LAB — BASIC METABOLIC PANEL
ANION GAP: 8 (ref 5–15)
BUN: 9 mg/dL (ref 6–20)
CALCIUM: 8.5 mg/dL — AB (ref 8.9–10.3)
CO2: 23 mmol/L (ref 22–32)
Chloride: 106 mmol/L (ref 98–111)
Creatinine, Ser: 0.64 mg/dL (ref 0.44–1.00)
GFR calc Af Amer: >60 mL/min (ref >60)
GLUCOSE: 161 mg/dL — AB (ref 70–99)
Potassium: 4.2 mmol/L (ref 3.5–5.1)
Sodium: 137 mmol/L (ref 135–145)

## 2017-10-03 LAB — TROPONIN I

## 2017-10-03 NOTE — ED Triage Notes (Signed)
Non productive cough/chest pain since last night. Pt states sob with exertion.

## 2017-10-03 NOTE — ED Notes (Signed)
Pt states she has to leave and can no longer wait. Informed of need to stay and be seen. Pt reports that she has to leave to go get child off of school bus.pt did not want to sign.

## 2017-10-06 ENCOUNTER — Encounter (HOSPITAL_COMMUNITY): Payer: Self-pay | Admitting: Emergency Medicine

## 2017-10-06 ENCOUNTER — Other Ambulatory Visit: Payer: Self-pay

## 2017-10-06 ENCOUNTER — Emergency Department (HOSPITAL_COMMUNITY)
Admission: EM | Admit: 2017-10-06 | Discharge: 2017-10-06 | Disposition: A | Discharge status: Home / Self Care | Payer: Self-pay | Attending: Emergency Medicine | Admitting: Emergency Medicine

## 2017-10-06 ENCOUNTER — Emergency Department (HOSPITAL_COMMUNITY): Payer: Self-pay

## 2017-10-06 DIAGNOSIS — R079 Chest pain, unspecified: Secondary | ICD-10-CM | POA: Insufficient documentation

## 2017-10-06 DIAGNOSIS — Z5321 Procedure and treatment not carried out due to patient leaving prior to being seen by health care provider: Secondary | ICD-10-CM | POA: Insufficient documentation

## 2017-10-06 NOTE — ED Triage Notes (Signed)
Pt reports recurring chest pain and dizziness since Monday. Pt came here on Monday, but left due to wait time.

## 2017-10-06 NOTE — ED Notes (Signed)
Radiology called pt for xray with no response.

## 2017-10-06 NOTE — ED Notes (Signed)
Room ready. Called pt with no response.

## 2017-11-02 ENCOUNTER — Ambulatory Visit (HOSPITAL_COMMUNITY): Payer: Self-pay | Admitting: Psychiatry

## 2017-11-03 ENCOUNTER — Ambulatory Visit (HOSPITAL_COMMUNITY): Payer: Self-pay | Admitting: Psychiatry

## 2018-01-25 ENCOUNTER — Ambulatory Visit (INDEPENDENT_AMBULATORY_CARE_PROVIDER_SITE_OTHER): Payer: Self-pay | Admitting: Psychiatry

## 2018-01-25 ENCOUNTER — Encounter (HOSPITAL_COMMUNITY): Payer: Self-pay | Admitting: Psychiatry

## 2018-01-25 DIAGNOSIS — F331 Major depressive disorder, recurrent, moderate: Secondary | ICD-10-CM

## 2018-01-25 MED ORDER — TRAZODONE HCL 50 MG PO TABS: 50.0000 mg | ORAL_TABLET | Freq: Every day | ORAL | 2 refills | Status: DC

## 2018-01-25 MED ORDER — CITALOPRAM HYDROBROMIDE 20 MG PO TABS: 20.0000 mg | ORAL_TABLET | Freq: Two times a day (BID) | ORAL | 2 refills | Status: DC

## 2018-01-25 MED ORDER — ALPRAZOLAM 1 MG PO TABS: 1.0000 mg | ORAL_TABLET | Freq: Four times a day (QID) | ORAL | 2 refills | Status: DC

## 2018-01-25 NOTE — Progress Notes (Signed)
5/25
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
## 2018-02-13 ENCOUNTER — Encounter: Payer: Self-pay | Admitting: Physician Assistant

## 2018-02-13 ENCOUNTER — Ambulatory Visit: Payer: Self-pay | Admitting: Physician Assistant

## 2018-02-13 VITALS — BP 151/101 | HR 97 | Temp 97.5°F | Ht 61.0 in | Wt 154.2 lb

## 2018-02-13 DIAGNOSIS — R87619 Unspecified abnormal cytological findings in specimens from cervix uteri: Secondary | ICD-10-CM

## 2018-02-13 DIAGNOSIS — Z7689 Persons encountering health services in other specified circumstances: Secondary | ICD-10-CM

## 2018-02-13 DIAGNOSIS — E785 Hyperlipidemia, unspecified: Secondary | ICD-10-CM

## 2018-02-13 DIAGNOSIS — Z131 Encounter for screening for diabetes mellitus: Secondary | ICD-10-CM

## 2018-02-13 DIAGNOSIS — I10 Essential (primary) hypertension: Secondary | ICD-10-CM

## 2018-02-13 DIAGNOSIS — Z1239 Encounter for other screening for malignant neoplasm of breast: Secondary | ICD-10-CM

## 2018-02-13 MED ORDER — LOSARTAN POTASSIUM 50 MG PO TABS: 50.0000 mg | ORAL_TABLET | Freq: Every day | ORAL | 0 refills | Status: DC

## 2018-02-13 MED ORDER — HYDRALAZINE HCL 25 MG PO TABS: 25.0000 mg | ORAL_TABLET | Freq: Three times a day (TID) | ORAL | 0 refills | Status: DC

## 2018-02-13 MED ORDER — HYDROCHLOROTHIAZIDE 12.5 MG PO CAPS: 12.5000 mg | ORAL_CAPSULE | Freq: Every day | ORAL | 0 refills | Status: DC

## 2018-02-13 NOTE — Progress Notes (Signed)
BP (!) 151/101 (BP Location: Left Arm, Patient Position: Sitting, Cuff Size: Normal)   Pulse 97   Temp (!) 97.5 F (36.4 C)   Ht 5\' 1"  (1.549 m)   Wt 154 lb 4 oz (70 kg)   SpO2 95%   BMI 29.15 kg/m    Subjective:    Patient ID: Beth Sanford, female    DOB: 03-15-1970, 48 y.o.   MRN: [id number]  HPI: Beth Sanford is a 48 y.o. female presenting on 02/13/2018 for New Patient (Initial Visit)   HPI  Pt seen here at [facility] in the past.  Last seen February 2018.  She may have stopped coming because she was quite upset about having been diagnosed with scabies (pt was seen by Dermatology who agreed with diagnosis)  Pt has been seeing dr Tenny Craw.  She was last seen there 01/25/18.   Pt says she had PAP in jail (in [place]) in November.  It recomends follow-up PAP 6 - 12 months due to "PAP shows an epithelial cell abnormality"  She says she got out of jail January 20 after being there 90 days  Relevant past medical, surgical, family and social history reviewed and updated as indicated. Interim medical history since our last visit reviewed. Allergies and medications reviewed and updated.   Current Outpatient Medications:  .  ALPRAZolam (XANAX) 1 MG tablet, Take 1 tablet (1 mg total) by mouth 4 (four) times daily. (Patient taking differently: Take 2 mg by mouth 4 (four) times daily. ), Disp: 120 tablet, Rfl: 2 .  Ca Carbonate-Mag Hydroxide (ROLAIDS PO), Take 2 tablets by mouth daily as needed (heartburn)., Disp: , Rfl:  .  citalopram (CELEXA) 20 MG tablet, Take 1 tablet (20 mg total) by mouth 2 (two) times daily. (Patient taking differently: Take 20 mg by mouth daily. ), Disp: 60 tablet, Rfl: 2 .  hydrALAZINE (APRESOLINE) 25 MG tablet, Take 1 tablet (25 mg total) by mouth 3 (three) times daily. (Patient taking differently: Take 25 mg by mouth daily. ), Disp: 270 tablet, Rfl: 1 .  hydrochlorothiazide (MICROZIDE) 12.5 MG capsule, Take 1 capsule (12.5 mg total) by mouth daily., Disp: 90 capsule,  Rfl: 3 .  losartan (COZAAR) 50 MG tablet, Take 50 mg by mouth daily., Disp: , Rfl:  .  Omega-3 Fatty Acids (FISH OIL PO), Take 1 tablet by mouth daily. , Disp: , Rfl:  .  traZODone (DESYREL) 50 MG tablet, Take 1 tablet (50 mg total) by mouth at bedtime., Disp: 30 tablet, Rfl: 2    Review of Systems  Constitutional: Negative for appetite change, chills, diaphoresis, fatigue, fever and unexpected weight change.  HENT: Negative for congestion, dental problem, drooling, ear pain, facial swelling, hearing loss, mouth sores, sneezing, sore throat, trouble swallowing and voice change.   Eyes: Negative for pain, discharge, redness, itching and visual disturbance.  Respiratory: Negative for cough, choking, shortness of breath and wheezing.   Cardiovascular: Negative for chest pain, palpitations and leg swelling.  Gastrointestinal: Negative for abdominal pain, blood in stool, constipation, diarrhea and vomiting.  Endocrine: Positive for heat intolerance. Negative for cold intolerance and polydipsia.  Genitourinary: Negative for decreased urine volume, dysuria and hematuria.  Musculoskeletal: Negative for arthralgias, back pain and gait problem.  Skin: Negative for rash.  Allergic/Immunologic: Negative for environmental allergies.  Neurological: Negative for seizures, syncope, light-headedness and headaches.  Hematological: Negative for adenopathy.  Psychiatric/Behavioral: Positive for agitation and dysphoric mood. Negative for suicidal ideas. The patient is nervous/anxious.  Per HPI unless specifically indicated above     Objective:    BP (!) 151/101 (BP Location: Left Arm, Patient Position: Sitting, Cuff Size: Normal)   Pulse 97   Temp (!) 97.5 F (36.4 C)   Ht 5\' 1"  (1.549 m)   Wt 154 lb 4 oz (70 kg)   SpO2 95%   BMI 29.15 kg/m   Wt Readings from Last 3 Encounters:  02/13/18 154 lb 4 oz (70 kg)  10/06/17 146 lb (66.2 kg)  10/03/17 145 lb 15.1 oz (66.2 kg)    Physical  Exam Vitals signs reviewed.  Constitutional:      Appearance: She is well-developed.  HENT:     Head: Normocephalic and atraumatic.     Mouth/Throat:     Pharynx: No oropharyngeal exudate.  Eyes:     Conjunctiva/sclera: Conjunctivae normal.     Pupils: Pupils are equal, round, and reactive to light.  Neck:     Musculoskeletal: Neck supple.     Thyroid: No thyromegaly.  Cardiovascular:     Rate and Rhythm: Normal rate and regular rhythm.  Pulmonary:     Effort: Pulmonary effort is normal.     Breath sounds: Normal breath sounds.  Abdominal:     General: Bowel sounds are normal.     Palpations: Abdomen is soft. There is no mass.     Tenderness: There is no abdominal tenderness.  Lymphadenopathy:     Cervical: No cervical adenopathy.  Skin:    General: Skin is warm and dry.  Neurological:     Mental Status: She is alert and oriented to person, place, and time.     Gait: Gait normal.  Psychiatric:        Behavior: Behavior normal.         Assessment & Plan:   Encounter Diagnoses  Name Primary?  . Encounter to establish care Yes  . Essential hypertension   . Hyperlipidemia, unspecified hyperlipidemia type   . Screening for diabetes mellitus   . Screening for breast cancer   . Abnormal cervical Papanicolaou smear, unspecified abnormal pap finding     -will update labs -ordered screening mammogram -Discussed with pt that PAP due for repeat between May and November this year -confirmed that pt has been signed up for [facility] and Rx sent -pt to follow up 1 month to recheck BP and review labs.  Pt to RTO sooner prn

## 2018-02-17 ENCOUNTER — Other Ambulatory Visit (HOSPITAL_COMMUNITY)
Admission: RE | Admit: 2018-02-17 | Discharge: 2018-02-17 | Disposition: A | Discharge status: Home / Self Care | Payer: Self-pay | Source: Ambulatory Visit | Attending: Physician Assistant | Admitting: Physician Assistant

## 2018-02-17 DIAGNOSIS — Z131 Encounter for screening for diabetes mellitus: Secondary | ICD-10-CM | POA: Insufficient documentation

## 2018-02-17 DIAGNOSIS — E785 Hyperlipidemia, unspecified: Secondary | ICD-10-CM | POA: Insufficient documentation

## 2018-02-17 DIAGNOSIS — I10 Essential (primary) hypertension: Secondary | ICD-10-CM | POA: Insufficient documentation

## 2018-02-17 LAB — HEMOGLOBIN A1C
Hgb A1c MFr Bld: 6.1 % — ABNORMAL HIGH (ref 4.8–5.6)
Mean Plasma Glucose: 128.37 mg/dL

## 2018-02-17 LAB — COMPREHENSIVE METABOLIC PANEL
ALBUMIN: 4 g/dL (ref 3.5–5.0)
ALK PHOS: 61 U/L (ref 38–126)
ALT: 21 U/L (ref 0–44)
AST: 18 U/L (ref 15–41)
Anion gap: 8 (ref 5–15)
BUN: 10 mg/dL (ref 6–20)
CALCIUM: 8.6 mg/dL — AB (ref 8.9–10.3)
CO2: 25 mmol/L (ref 22–32)
CREATININE: 0.62 mg/dL (ref 0.44–1.00)
Chloride: 104 mmol/L (ref 98–111)
GFR calc Af Amer: >60 mL/min (ref >60)
GFR calc non Af Amer: >60 mL/min (ref >60)
GLUCOSE: 134 mg/dL — AB (ref 70–99)
Potassium: 3.8 mmol/L (ref 3.5–5.1)
Sodium: 137 mmol/L (ref 135–145)
Total Bilirubin: 0.7 mg/dL (ref 0.3–1.2)
Total Protein: 7.4 g/dL (ref 6.5–8.1)

## 2018-02-17 LAB — LIPID PANEL
Cholesterol: 173 mg/dL (ref 0–200)
HDL: 54 mg/dL (ref >40)
LDL CALC: 92 mg/dL (ref 0–99)
Total CHOL/HDL Ratio: 3.2 RATIO
Triglycerides: 135 mg/dL (ref <150)
VLDL: 27 mg/dL (ref 0–40)

## 2018-02-21 ENCOUNTER — Other Ambulatory Visit: Payer: Self-pay | Admitting: Physician Assistant

## 2018-02-21 DIAGNOSIS — Z1239 Encounter for other screening for malignant neoplasm of breast: Secondary | ICD-10-CM

## 2018-02-22 ENCOUNTER — Encounter (HOSPITAL_COMMUNITY): Payer: Self-pay | Admitting: Psychiatry

## 2018-02-22 ENCOUNTER — Ambulatory Visit (INDEPENDENT_AMBULATORY_CARE_PROVIDER_SITE_OTHER): Payer: Self-pay | Admitting: Psychiatry

## 2018-02-22 DIAGNOSIS — F331 Major depressive disorder, recurrent, moderate: Secondary | ICD-10-CM

## 2018-02-22 MED ORDER — CITALOPRAM HYDROBROMIDE 20 MG PO TABS: 20.0000 mg | ORAL_TABLET | Freq: Two times a day (BID) | ORAL | 2 refills | Status: DC

## 2018-02-22 MED ORDER — TRAZODONE HCL 50 MG PO TABS: 50.0000 mg | ORAL_TABLET | Freq: Every day | ORAL | 2 refills | Status: DC

## 2018-02-22 MED ORDER — ALPRAZOLAM 1 MG PO TABS: 1.0000 mg | ORAL_TABLET | Freq: Four times a day (QID) | ORAL | 2 refills | Status: DC
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
## 2018-02-28 ENCOUNTER — Other Ambulatory Visit: Payer: Self-pay

## 2018-02-28 ENCOUNTER — Encounter (HOSPITAL_COMMUNITY): Payer: Self-pay | Admitting: Emergency Medicine

## 2018-02-28 ENCOUNTER — Emergency Department (HOSPITAL_COMMUNITY)
Admission: EM | Admit: 2018-02-28 | Discharge: 2018-02-28 | Disposition: A | Discharge status: Home / Self Care | Payer: Self-pay | Attending: Emergency Medicine | Admitting: Emergency Medicine

## 2018-02-28 DIAGNOSIS — I110 Hypertensive heart disease with heart failure: Secondary | ICD-10-CM | POA: Insufficient documentation

## 2018-02-28 DIAGNOSIS — Z87891 Personal history of nicotine dependence: Secondary | ICD-10-CM | POA: Insufficient documentation

## 2018-02-28 DIAGNOSIS — L0291 Cutaneous abscess, unspecified: Secondary | ICD-10-CM

## 2018-02-28 DIAGNOSIS — L02211 Cutaneous abscess of abdominal wall: Secondary | ICD-10-CM | POA: Insufficient documentation

## 2018-02-28 DIAGNOSIS — Z79899 Other long term (current) drug therapy: Secondary | ICD-10-CM | POA: Insufficient documentation

## 2018-02-28 DIAGNOSIS — I509 Heart failure, unspecified: Secondary | ICD-10-CM | POA: Insufficient documentation

## 2018-02-28 MED ORDER — POVIDONE-IODINE 10 % EX SOLN
CUTANEOUS | Status: AC
  Administered 2018-02-28: 21:00:00
  Filled 2018-02-28: qty 45

## 2018-02-28 MED ORDER — CEPHALEXIN 500 MG PO CAPS: 500.0000 mg | ORAL_CAPSULE | Freq: Four times a day (QID) | ORAL | 0 refills | Status: DC

## 2018-02-28 MED ORDER — CEPHALEXIN 500 MG PO CAPS
500.0000 mg | ORAL_CAPSULE | Freq: Once | ORAL | Status: AC
  Administered 2018-02-28: 500 mg via ORAL
  Filled 2018-02-28: qty 1

## 2018-02-28 MED ORDER — HYDROCODONE-ACETAMINOPHEN 5-325 MG PO TABS: 1.0000 | ORAL_TABLET | ORAL | 0 refills | Status: DC | PRN

## 2018-02-28 MED ORDER — DOXYCYCLINE HYCLATE 100 MG PO CAPS: 100.0000 mg | ORAL_CAPSULE | Freq: Two times a day (BID) | ORAL | 0 refills | Status: DC

## 2018-02-28 MED ORDER — DOXYCYCLINE HYCLATE 100 MG PO TABS
100.0000 mg | ORAL_TABLET | Freq: Once | ORAL | Status: AC
  Administered 2018-02-28: 100 mg via ORAL
  Filled 2018-02-28: qty 1

## 2018-02-28 MED ORDER — HYDROCODONE-ACETAMINOPHEN 5-325 MG PO TABS
2.0000 | ORAL_TABLET | Freq: Once | ORAL | Status: AC
  Administered 2018-02-28: 2 via ORAL
  Filled 2018-02-28: qty 2

## 2018-02-28 MED ORDER — LIDOCAINE HCL (PF) 1 % IJ SOLN
INTRAMUSCULAR | Status: AC
  Filled 2018-02-28: qty 20

## 2018-02-28 MED ORDER — LIDOCAINE HCL (PF) 1 % IJ SOLN
20.0000 mL | Freq: Once | INTRAMUSCULAR | Status: AC
  Administered 2018-02-28: 20 mL

## 2018-02-28 NOTE — ED Provider Notes (Addendum)
[facility] EMERGENCY DEPARTMENT Provider Note   CSN: [id number] Arrival date & time: 02/28/18  1607    History   Chief Complaint Chief Complaint  Patient presents with  . Abscess    HPI Beth Sanford is a 48 y.o. female.     HPI   She complains of a worsening sore on her right lower quadrant abdominal region, present for several days without known trauma.  She has been using warm compresses without relief.  She denies fever, chills, nausea, vomiting, weakness or dizziness.  There are no other known modifying factors.  Past Medical History:  Diagnosis Date  . Anxiety   . CHF (congestive heart failure) (HCC)   . Depression   . High cholesterol   . Hypertension   . OSA (obstructive sleep apnea) 09/13/2014    Patient Active Problem List   Diagnosis Date Noted  . Abnormal LFTs 09/16/2015  . Tobacco abuse 12/18/2014  . OSA (obstructive sleep apnea) 09/13/2014  . Major depression 06/08/2013  . Chest pain 11/01/2012  . Essential hypertension, malignant 11/01/2012  . Hyperlipidemia 11/01/2012    Past Surgical History:  Procedure Laterality Date  . CHOLECYSTECTOMY    . ELBOW SURGERY  left     OB History    Gravida  3   Para  1   Term      Preterm      AB  2   Living        SAB      TAB      Ectopic      Multiple      Live Births               Home Medications    Prior to Admission medications   Medication Sig Start Date End Date Taking? Authorizing Provider  ALPRAZolam Prudy Feeler) 1 MG tablet Take 1 tablet (1 mg total) by mouth 4 (four) times daily. 02/22/18   Myrlene Broker, MD  Ca Carbonate-Mag Hydroxide (ROLAIDS PO) Take 2 tablets by mouth daily as needed (heartburn).    [provider]  cephALEXin (KEFLEX) 500 MG capsule Take 1 capsule (500 mg total) by mouth 4 (four) times daily. 02/28/18   Mancel Bale, MD  citalopram (CELEXA) 20 MG tablet Take 1 tablet (20 mg total) by mouth 2 (two) times daily. 02/22/18 02/22/19  Myrlene Broker, MD  doxycycline (VIBRAMYCIN) 100 MG capsule Take 1 capsule (100 mg total) by mouth 2 (two) times daily. 02/28/18   Mancel Bale, MD  hydrALAZINE (APRESOLINE) 25 MG tablet Take 1 tablet (25 mg total) by mouth 3 (three) times daily. 02/13/18   Jacquelin Hawking, PA-C  hydrochlorothiazide (MICROZIDE) 12.5 MG capsule Take 1 capsule (12.5 mg total) by mouth daily. 02/13/18   Jacquelin Hawking, PA-C  HYDROcodone-acetaminophen (NORCO) 5-325 MG tablet Take 1 tablet by mouth every 4 (four) hours as needed for moderate pain. 02/28/18   Mancel Bale, MD  losartan (COZAAR) 50 MG tablet Take 1 tablet (50 mg total) by mouth daily. 02/13/18   Jacquelin Hawking, PA-C  Omega-3 Fatty Acids (FISH OIL PO) Take 1 tablet by mouth daily.     [provider]  traZODone (DESYREL) 50 MG tablet Take 1 tablet (50 mg total) by mouth at bedtime. 02/22/18   Myrlene Broker, MD    Family History Family History  Problem Relation Age of Onset  . Alcohol abuse Father   . Heart failure Father        deceased  at age 44  . Hypertension Brother   . Bipolar disorder Brother   . Emphysema Maternal Grandfather   . Colon cancer Neg Hx     Social History Social History   Tobacco Use  . Smoking status: Former Smoker    Packs/day: 0.50    Years: 27.00    Pack years: 13.50    Types: Cigarettes    Start date: 01/05/1984    Last attempt to quit: 01/05/2012    Years since quitting: 6.1  . Smokeless tobacco: Never Used  Substance Use Topics  . Alcohol use: No    Alcohol/week: 0.0 standard drinks  . Drug use: No     Allergies   Codeine and Tramadol   Review of Systems Review of Systems  All other systems reviewed and are negative.    Physical Exam Updated Vital Signs BP (!) 145/97   Pulse 82   Temp 97.8 F (36.6 C) (Oral)   Resp 16   Ht 5\' 1"  (1.549 m)   Wt 73 kg   SpO2 99%   BMI 30.42 kg/m   Physical Exam Vitals signs and nursing note reviewed.  Constitutional:      Appearance: She is  well-developed.  HENT:     Head: Normocephalic and atraumatic.     Right Ear: External ear normal.     Left Ear: External ear normal.  Eyes:     Conjunctiva/sclera: Conjunctivae normal.     Pupils: Pupils are equal, round, and reactive to light.  Neck:     Musculoskeletal: Normal range of motion and neck supple.     Trachea: Phonation normal.  Cardiovascular:     Rate and Rhythm: Normal rate.  Pulmonary:     Effort: Pulmonary effort is normal.  Abdominal:     Palpations: Abdomen is soft.     Comments: Right lower quadrant region abdominal wall tenderness swelling and redness.  Of her panniculus.  There is central induration with pointing, consistent with localized abscess.  There is no diffuse abdominal tenderness or peritoneal signs.  Musculoskeletal: Normal range of motion.  Skin:    General: Skin is warm and dry.  Neurological:     Mental Status: She is alert and oriented to person, place, and time.     Cranial Nerves: No cranial nerve deficit.     Sensory: No sensory deficit.     Motor: No abnormal muscle tone.     Coordination: Coordination normal.  Psychiatric:        Mood and Affect: Mood normal.        Behavior: Behavior normal.        Thought Content: Thought content normal.        Judgment: Judgment normal.      ED Treatments / Results  Labs (all labs ordered are listed, but only abnormal results are displayed) Labs Reviewed - No data to display  EKG None  Radiology No results found.  Procedures .Marland KitchenIncision and Drainage Date/Time: 02/28/2018 8:52 PM Performed by: Mancel Bale, MD Authorized by: Mancel Bale, MD   Consent:    Consent obtained:  Verbal   Consent given by:  Patient   Risks discussed:  Bleeding, incomplete drainage, infection and pain   Alternatives discussed:  No treatment Location:    Type:  Abscess   Size:  4 cm   Location: Mid lower abdominal wall. Pre-procedure details:    Skin preparation:  Betadine Anesthesia (see MAR for  exact dosages):    Anesthesia method:  Local infiltration   Local anesthetic:  Lidocaine 1% w/o epi Procedure type:    Complexity:  Simple Procedure details:    Needle aspiration: no     Incision types:  Single straight   Incision depth:  Subcutaneous   Scalpel blade:  11   Wound management:  Probed and deloculated   Drainage:  Bloody and purulent   Drainage amount:  Moderate   Wound treatment:  Drain placed   Packing materials:  1/4 in gauze Post-procedure details:    Patient tolerance of procedure:  Tolerated well, no immediate complications   (including critical care time)  Medications Ordered in ED Medications  HYDROcodone-acetaminophen (NORCO/VICODIN) 5-325 MG per tablet 2 tablet (2 tablets Oral Given 02/28/18 2026)  lidocaine (PF) (XYLOCAINE) 1 % injection 20 mL (20 mLs Infiltration Given 02/28/18 2058)  cephALEXin (KEFLEX) capsule 500 mg (500 mg Oral Given 02/28/18 2058)  doxycycline (VIBRA-TABS) tablet 100 mg (100 mg Oral Given 02/28/18 2058)  povidone-iodine (BETADINE) 10 % external solution (  Given 02/28/18 2059)     Initial Impression / Assessment and Plan / ED Course  I have reviewed the triage vital signs and the nursing notes.  Pertinent labs & imaging results that were available during my care of the patient were reviewed by me and considered in my medical decision making (see chart for details).         Patient Vitals for the past 24 hrs:  BP Temp Temp src Pulse Resp SpO2 Height Weight  02/28/18 2030 (!) 145/97 - - 82 16 99 % - -  02/28/18 1616 - - - - - - 5\' 1"  (1.549 m) 73 kg  02/28/18 1615 135/82 97.8 F (36.6 C) Oral 88 16 96 % - -    8:54 PM Reevaluation with update and discussion. After initial assessment and treatment, an updated evaluation reveals she is more comfortable at this time has no further complaints.  Findings discussed and questions answered. Mancel BaleElliott Kevis Qu   Medical Decision Making: Abdominal wall abscess, etiology is not clear.  Likely  mild surrounding cellulitis.  Doubt sepsis or metabolic instability.   CRITICAL CARE-no Performed by: Mancel BaleElliott Ceaira Ernster  Nursing Notes Reviewed/ Care Coordinated Applicable Imaging Reviewed Interpretation of Laboratory Data incorporated into ED treatment  The patient appears reasonably screened and/or stabilized for discharge and I doubt any other medical condition or other [facility] requiring further screening, evaluation, or treatment in the ED at this time prior to discharge.  Plan: Home Medications-continue regular medications, OTC analgesia of choice; Home Treatments-warm soaks 3 times daily.  Patient tomorrow with packing in 3 to 4 days by pulling straight out.; return here if the recommended treatment, does not improve the symptoms; Recommended follow up-return here or see PCP as needed.   Final Clinical Impressions(s) / ED Diagnoses   Final diagnoses:  Abscess    ED Discharge Orders         Ordered    HYDROcodone-acetaminophen (NORCO) 5-325 MG tablet  Every 4 hours PRN     02/28/18 2109    cephALEXin (KEFLEX) 500 MG capsule  4 times daily     02/28/18 2109    doxycycline (VIBRAMYCIN) 100 MG capsule  2 times daily     02/28/18 2109           Mancel BaleWentz, Jamia Hoban, MD 02/28/18 2112    Mancel BaleWentz, Alexiana Laverdure, MD 03/19/18 1200

## 2018-02-28 NOTE — ED Triage Notes (Signed)
Pt reports abscess to lower abdomen that began since Thursday night. States it has increased in size. Pt also c/o fever and general malaise.

## 2018-02-28 NOTE — Discharge Instructions (Signed)
Remove the bandage tomorrow and start using warm compresses or soaking in a tub 2-3 times a day.  If the packing has not fallen out in 3 or 4 days, pull it straight out.  When the packing comes out, the wound will gradually heal and close, over the course of a couple of weeks.  Keep the wound clean daily with soap and water.  Keep a bandage on it if the wound is draining.  See your doctor for checkup as needed.

## 2018-03-14 ENCOUNTER — Ambulatory Visit: Payer: Self-pay | Admitting: Physician Assistant

## 2018-03-15 ENCOUNTER — Ambulatory Visit: Payer: Self-pay | Admitting: Physician Assistant

## 2018-03-15 ENCOUNTER — Ambulatory Visit (HOSPITAL_COMMUNITY): Payer: Self-pay

## 2018-03-15 ENCOUNTER — Encounter (HOSPITAL_COMMUNITY): Payer: Self-pay

## 2018-03-15 ENCOUNTER — Ambulatory Visit (HOSPITAL_COMMUNITY)
Admission: RE | Admit: 2018-03-15 | Discharge: 2018-03-15 | Disposition: A | Discharge status: Home / Self Care | Payer: Self-pay | Source: Ambulatory Visit | Attending: Physician Assistant | Admitting: Physician Assistant

## 2018-03-15 ENCOUNTER — Other Ambulatory Visit: Payer: Self-pay

## 2018-03-15 DIAGNOSIS — Z1239 Encounter for other screening for malignant neoplasm of breast: Secondary | ICD-10-CM | POA: Insufficient documentation

## 2018-03-23 ENCOUNTER — Telehealth: Payer: Self-pay | Admitting: Student

## 2018-03-23 NOTE — Telephone Encounter (Signed)
Pt called c/o flu-like symptoms and stating she needs to be tested for the coronavirus. Pt states she feels like she has had a fever, but has not checked her temperature. Pt states there has been a big scare at work and her job is requiring everyone at work to be tested for the coronavirus.   LPN explained to the patient that current guidelines for testing for coronavirus include fever, so pt was encouraged to check her temperature.   Pt states she does not have a thermometer. LPN suggested pt buy one from her nearest pharmacy. Pt then stated she has no transportation to go buy a thermometer.  LPN once again explained to pt that a fever is current guideline for coronavirus testing. Pt states she will go to the hospital to get her temperature checked and will [place] back.

## 2018-03-26 ENCOUNTER — Encounter (HOSPITAL_COMMUNITY): Payer: Self-pay | Admitting: Emergency Medicine

## 2018-03-26 ENCOUNTER — Other Ambulatory Visit: Payer: Self-pay

## 2018-03-26 ENCOUNTER — Emergency Department (HOSPITAL_COMMUNITY)
Admission: EM | Admit: 2018-03-26 | Discharge: 2018-03-26 | Disposition: A | Discharge status: Home / Self Care | Payer: Self-pay | Attending: Emergency Medicine | Admitting: Emergency Medicine

## 2018-03-26 DIAGNOSIS — I509 Heart failure, unspecified: Secondary | ICD-10-CM | POA: Insufficient documentation

## 2018-03-26 DIAGNOSIS — T7840XA Allergy, unspecified, initial encounter: Secondary | ICD-10-CM

## 2018-03-26 DIAGNOSIS — Z87891 Personal history of nicotine dependence: Secondary | ICD-10-CM | POA: Insufficient documentation

## 2018-03-26 DIAGNOSIS — I110 Hypertensive heart disease with heart failure: Secondary | ICD-10-CM | POA: Insufficient documentation

## 2018-03-26 DIAGNOSIS — J302 Other seasonal allergic rhinitis: Secondary | ICD-10-CM | POA: Insufficient documentation

## 2018-03-26 DIAGNOSIS — Z79899 Other long term (current) drug therapy: Secondary | ICD-10-CM | POA: Insufficient documentation

## 2018-03-26 NOTE — ED Provider Notes (Signed)
[facility] EMERGENCY DEPARTMENT Provider Note   CSN: [id number] Arrival date & time: 03/26/18  1311    History   Chief Complaint Chief Complaint  Patient presents with  . Needs work note    HPI Beth Sanford is a 48 y.o. female.     The history is provided by the patient. No language interpreter was used.     48 year old female with history of tobacco abuse, hypertension, hyperlipidemia, depression presents requesting for work note.  Patient reports she was developing allergy symptoms which includes congestion, runny nose, sneezing, coughing approximately 4 days ago.  She called out of work and she went to urgent care for her symptoms.  She was giving medication for her allergies which did help.  She tried returning back to work yesterday but states that she needs a note to be cleared for work due to recent COVID-19 pandemic.  Patient states her symptom is mostly resolved.  She denies any recent travel to endemic region and no recent sick contact with anyone tested positive for COVID-19.  She would like to return back to work tomorrow.  Past Medical History:  Diagnosis Date  . Anxiety   . CHF (congestive heart failure) (HCC)   . Depression   . High cholesterol   . Hypertension   . OSA (obstructive sleep apnea) 09/13/2014    Patient Active Problem List   Diagnosis Date Noted  . Abnormal LFTs 09/16/2015  . Tobacco abuse 12/18/2014  . OSA (obstructive sleep apnea) 09/13/2014  . Major depression 06/08/2013  . Chest pain 11/01/2012  . Essential hypertension, malignant 11/01/2012  . Hyperlipidemia 11/01/2012    Past Surgical History:  Procedure Laterality Date  . CHOLECYSTECTOMY    . ELBOW SURGERY  left     OB History    Gravida  3   Para  1   Term      Preterm      AB  2   Living        SAB      TAB      Ectopic      Multiple      Live Births               Home Medications    Prior to Admission medications   Medication Sig Start Date End  Date Taking? Authorizing Provider  ALPRAZolam Prudy Feeler) 1 MG tablet Take 1 tablet (1 mg total) by mouth 4 (four) times daily. 02/22/18   Myrlene Broker, MD  Ca Carbonate-Mag Hydroxide (ROLAIDS PO) Take 2 tablets by mouth daily as needed (heartburn).    [provider]  cephALEXin (KEFLEX) 500 MG capsule Take 1 capsule (500 mg total) by mouth 4 (four) times daily. 02/28/18   Mancel Bale, MD  citalopram (CELEXA) 20 MG tablet Take 1 tablet (20 mg total) by mouth 2 (two) times daily. 02/22/18 02/22/19  Myrlene Broker, MD  doxycycline (VIBRAMYCIN) 100 MG capsule Take 1 capsule (100 mg total) by mouth 2 (two) times daily. 02/28/18   Mancel Bale, MD  hydrALAZINE (APRESOLINE) 25 MG tablet Take 1 tablet (25 mg total) by mouth 3 (three) times daily. 02/13/18   Jacquelin Hawking, PA-C  hydrochlorothiazide (MICROZIDE) 12.5 MG capsule Take 1 capsule (12.5 mg total) by mouth daily. 02/13/18   Jacquelin Hawking, PA-C  HYDROcodone-acetaminophen (NORCO) 5-325 MG tablet Take 1 tablet by mouth every 4 (four) hours as needed for moderate pain. 02/28/18   Mancel Bale, MD  losartan (COZAAR) 50 MG  tablet Take 1 tablet (50 mg total) by mouth daily. 02/13/18   Jacquelin Hawking, PA-C  Omega-3 Fatty Acids (FISH OIL PO) Take 1 tablet by mouth daily.     [provider]  traZODone (DESYREL) 50 MG tablet Take 1 tablet (50 mg total) by mouth at bedtime. 02/22/18   Myrlene Broker, MD    Family History Family History  Problem Relation Age of Onset  . Alcohol abuse Father   . Heart failure Father        deceased at age 73  . Hypertension Brother   . Bipolar disorder Brother   . Emphysema Maternal Grandfather   . Colon cancer Neg Hx     Social History Social History   Tobacco Use  . Smoking status: Former Smoker    Packs/day: 0.50    Years: 27.00    Pack years: 13.50    Types: Cigarettes    Start date: 01/05/1984    Last attempt to quit: 01/05/2012    Years since quitting: 6.2  . Smokeless tobacco:  Never Used  Substance Use Topics  . Alcohol use: No    Alcohol/week: 0.0 standard drinks  . Drug use: No     Allergies   Codeine and Tramadol   Review of Systems Review of Systems  All other systems reviewed and are negative.    Physical Exam Updated Vital Signs BP (!) 146/108 (BP Location: Right Arm)   Pulse 91   Temp 97.9 F (36.6 C) (Oral)   Resp 18   Ht 5\' 1"  (1.549 m)   Wt 73 kg   LMP 03/05/2018   SpO2 100%   BMI 30.41 kg/m   Physical Exam Vitals signs and nursing note reviewed.  Constitutional:      General: She is not in acute distress.    Appearance: She is well-developed.  HENT:     Head: Atraumatic.     Right Ear: Tympanic membrane normal.     Left Ear: Tympanic membrane normal.     Nose: Nose normal.     Mouth/Throat:     Mouth: Mucous membranes are moist.  Eyes:     Conjunctiva/sclera: Conjunctivae normal.  Neck:     Musculoskeletal: Neck supple.  Cardiovascular:     Rate and Rhythm: Normal rate and regular rhythm.     Pulses: Normal pulses.     Heart sounds: Normal heart sounds.  Pulmonary:     Effort: Pulmonary effort is normal.     Breath sounds: No wheezing.  Skin:    Findings: No rash.  Neurological:     Mental Status: She is alert.      ED Treatments / Results  Labs (all labs ordered are listed, but only abnormal results are displayed) Labs Reviewed - No data to display  EKG None  Radiology No results found.  Procedures Procedures (including critical care time)  Medications Ordered in ED Medications - No data to display   Initial Impression / Assessment and Plan / ED Course  I have reviewed the triage vital signs and the nursing notes.  Pertinent labs & imaging results that were available during my care of the patient were reviewed by me and considered in my medical decision making (see chart for details).        BP (!) 146/108 (BP Location: Right Arm)   Pulse 91   Temp 97.9 F (36.6 C) (Oral)   Resp 18    Ht 5\' 1"  (1.549 m)   Wt  73 kg   LMP 03/05/2018   SpO2 100%   BMI 30.41 kg/m    Final Clinical Impressions(s) / ED Diagnoses   Final diagnoses:  Allergic state, initial encounter    ED Discharge Orders    None     1:50 PM Pt here requesting for a note to return back to work after she called out due to having seasonal allergy.  She does not have any sxs concerning for COVID-19 infection.  Work note provided.  Pt stable for discharge.    Fayrene Helper, PA-C 03/26/18 1355    Samuel Jester, DO 03/29/18 1413

## 2018-03-26 NOTE — ED Triage Notes (Signed)
Patient needs work note. Has allergies and was seen on Friday at Urgent Care.

## 2018-04-17 ENCOUNTER — Encounter: Payer: Self-pay | Admitting: Physician Assistant

## 2018-04-17 ENCOUNTER — Ambulatory Visit: Payer: Self-pay | Admitting: Physician Assistant

## 2018-04-17 DIAGNOSIS — F39 Unspecified mood [affective] disorder: Secondary | ICD-10-CM

## 2018-04-17 DIAGNOSIS — I10 Essential (primary) hypertension: Secondary | ICD-10-CM

## 2018-04-17 DIAGNOSIS — J302 Other seasonal allergic rhinitis: Secondary | ICD-10-CM

## 2018-04-17 DIAGNOSIS — R7303 Prediabetes: Secondary | ICD-10-CM

## 2018-04-17 MED ORDER — LOSARTAN POTASSIUM 100 MG PO TABS: 100.0000 mg | ORAL_TABLET | Freq: Every day | ORAL | 1 refills | Status: DC

## 2018-04-17 NOTE — Progress Notes (Signed)
There were no vitals taken for this visit.   Subjective:    Patient ID: Beth Sanford, female    DOB: 12/12/1970, 48 y.o.   MRN: [id number]  HPI: Beth Sanford is a 48 y.o. female presenting on 04/17/2018 for No chief complaint on file.   HPI   This is a telemedicine visit due to coronavirus pandemic.  This is a telephone visit due to pt unable to get video to work.  I connected with  Ileana C Shimer on 04/17/18 by a video enabled telemedicine application and verified that I am speaking with the correct person using two identifiers.   I discussed the limitations of evaluation and management by telemedicine. The patient expressed understanding and agreed to proceed.   Pt says she is having a lot of allergies- nose running and eyes watery  and her boss is giving her a hard time because she goes to the bathroom to blow her nose because she doesn't think it is sanitary to blow her nose unless she goes to the restroom.    She is still seing dr [facility].   Pt states No fever.  No coughing.  No sick contacts.  No diarrhea.  No emesis.   She is having some sneezing in addition to runny nose and watery eyes described above.     She is using zyrtec and benedryl which help her symptoms.     Relevant past medical, surgical, family and social history reviewed and updated as indicated. Interim medical history since our last visit reviewed. Allergies and medications reviewed and updated.    Current Outpatient Medications:  .  ALPRAZolam (XANAX) 1 MG tablet, Take 1 tablet (1 mg total) by mouth 4 (four) times daily., Disp: 120 tablet, Rfl: 2 .  Ca Carbonate-Mag Hydroxide (ROLAIDS PO), Take 2 tablets by mouth daily as needed (heartburn)., Disp: , Rfl:  .  cetirizine (ZYRTEC) 10 MG tablet, Take 10 mg by mouth daily., Disp: , Rfl:  .  citalopram (CELEXA) 20 MG tablet, Take 1 tablet (20 mg total) by mouth 2 (two) times daily., Disp: 60 tablet, Rfl: 2 .  diphenhydrAMINE (BENADRYL) 25 MG  tablet, Take 25 mg by mouth every 6 (six) hours as needed., Disp: , Rfl:  .  hydrALAZINE (APRESOLINE) 25 MG tablet, Take 1 tablet (25 mg total) by mouth 3 (three) times daily., Disp: 270 tablet, Rfl: 0 .  hydrochlorothiazide (MICROZIDE) 12.5 MG capsule, Take 1 capsule (12.5 mg total) by mouth daily., Disp: 90 capsule, Rfl: 0 .  losartan (COZAAR) 50 MG tablet, Take 1 tablet (50 mg total) by mouth daily., Disp: 90 tablet, Rfl: 0 .  Omega-3 Fatty Acids (FISH OIL PO), Take 1 tablet by mouth daily. , Disp: , Rfl:  .  traZODone (DESYREL) 50 MG tablet, Take 1 tablet (50 mg total) by mouth at bedtime., Disp: 30 tablet, Rfl: 2  Review of Systems  Per HPI unless specifically indicated above     Objective:    There were no vitals taken for this visit.  Wt Readings from Last 3 Encounters:  03/26/18 160 lb 15 oz (73 kg)  02/28/18 161 lb (73 kg)  02/13/18 154 lb 4 oz (70 kg)    Physical Exam Pulmonary:     Effort: Pulmonary effort is normal. No respiratory distress.     Comments: Pt talks and talks and talks without pause without having any shortness of breath.  Neurological:     Mental Status: She is  alert and oriented to person, place, and time.        Results for orders placed or performed during the hospital encounter of 02/17/18  Hemoglobin A1c  Result Value Ref Range   Hgb A1c MFr Bld 6.1 (H) 4.8 - 5.6 %   Mean Plasma Glucose 128.37 mg/dL  Comprehensive metabolic panel  Result Value Ref Range   Sodium 137 135 - 145 mmol/L   Potassium 3.8 3.5 - 5.1 mmol/L   Chloride 104 98 - 111 mmol/L   CO2 25 22 - 32 mmol/L   Glucose, Bld 134 (H) 70 - 99 mg/dL   BUN 10 6 - 20 mg/dL   Creatinine, Ser 4.00 0.44 - 1.00 mg/dL   Calcium 8.6 (L) 8.9 - 10.3 mg/dL   Total Protein 7.4 6.5 - 8.1 g/dL   Albumin 4.0 3.5 - 5.0 g/dL   AST 18 15 - 41 U/L   ALT 21 0 - 44 U/L   Alkaline Phosphatase 61 38 - 126 U/L   Total Bilirubin 0.7 0.3 - 1.2 mg/dL   GFR calc non Af Amer >60 >60 mL/min   GFR calc Af  Amer >60 >60 mL/min   Anion gap 8 5 - 15  Lipid panel  Result Value Ref Range   Cholesterol 173 0 - 200 mg/dL   Triglycerides 867 <619 mg/dL   HDL 54 >50 mg/dL   Total CHOL/HDL Ratio 3.2 RATIO   VLDL 27 0 - 40 mg/dL   LDL Cholesterol 92 0 - 99 mg/dL      Assessment & Plan:   Encounter Diagnoses  Name Primary?  . Essential hypertension Yes  . Prediabetes   . Seasonal allergies   . Mood disorder (HCC)     -Reviewed labs with pt -counseled pt on prediabetes.  Will mail pt her AVS including information on prediabetes.  Discussed regular exercise and dietary changes.   -will Increase losartan as BP was up at last OV Here and at 2 ER visits that she had since that time. -RTW note as she has symptoms c/w seasonal allergies and not with CV19.  This will be faxed to her work at number that is provided by the pt -pt to follow up in 2 months.  Pt to contact office sooner prn

## 2018-04-17 NOTE — Patient Instructions (Signed)
Prediabetes  Prediabetes is the condition of having a blood sugar (blood glucose) level that is higher than it should be, but not high enough for you to be diagnosed with type 2 diabetes. Having prediabetes puts you at risk for developing type 2 diabetes (type 2 diabetes mellitus). Prediabetes may be called impaired glucose tolerance or impaired fasting glucose.  Prediabetes usually does not cause symptoms. Your health care provider can diagnose this condition with blood tests. You may be tested for prediabetes if you are overweight and if you have at least one other risk factor for prediabetes.  What is blood glucose, and how is it measured?  Blood glucose refers to the amount of glucose in your bloodstream. Glucose comes from eating foods that contain sugars and starches (carbohydrates), which the body breaks down into glucose. Your blood glucose level may be measured in mg/dL (milligrams per deciliter) or mmol/L (millimoles per liter). Your blood glucose may be checked with one or more of the following blood tests:  · A fasting blood glucose (FBG) test. You will not be allowed to eat (you will fast) for 8 hours or longer before a blood sample is taken.  ? A normal range for FBG is 70-100 mg/dl (3.9-5.6 mmol/L).  · An A1c (hemoglobin A1c) blood test. This test provides information about blood glucose control over the previous 2?3 months.  · An oral glucose tolerance test (OGTT). This test measures your blood glucose at two times:  ? After fasting. This is your baseline level.  ? Two hours after you drink a beverage that contains glucose.  You may be diagnosed with prediabetes:  · If your FBG is 100?125 mg/dL (5.6-6.9 mmol/L).  · If your A1c level is 5.7?6.4%.  · If your OGTT result is 140?199 mg/dL (7.8-11 mmol/L).  These blood tests may be repeated to confirm your diagnosis.  How can this condition affect me?  The pancreas produces a hormone (insulin) that helps to move glucose from the bloodstream into cells.  When cells in the body do not respond properly to insulin that the body makes (insulin resistance), excess glucose builds up in the blood instead of going into cells. As a result, high blood glucose (hyperglycemia) can develop, which can cause many complications. Hyperglycemia is a symptom of prediabetes.  Having high blood glucose for a long time is dangerous. Too much glucose in your blood can damage your nerves and blood vessels. Long-term damage can lead to complications from diabetes, which may include:  · Heart disease.  · Stroke.  · Blindness.  · Kidney disease.  · Depression.  · Poor circulation in the feet and legs, which could lead to surgical removal (amputation) in severe cases.  What can increase my risk?  Risk factors for prediabetes include:  · Having a family member with type 2 diabetes.  · Being overweight or obese.  · Being older than age 45.  · Being of American Indian, African-American, Hispanic/Latino, or Asian/Pacific Islander descent.  · Having an inactive (sedentary) lifestyle.  · Having a history of heart disease.  · History of gestational diabetes or polycystic ovary syndrome (PCOS), in women.  · Having low levels of good cholesterol (HDL-C) or high levels of blood fats (triglycerides).  · Having high blood pressure.  What actions can I take to prevent diabetes?         · Be physically active.  ? Do moderate-intensity physical activity for 30 or more minutes on 5 or more days of the week,   or as much as told by your health care provider. This could be brisk walking, biking, or water aerobics.  ? Ask your health care provider what activities are safe for you. A mix of physical activities may be best, such as walking, swimming, cycling, and strength training.  · Lose weight as told by your health care provider.  ? Losing 5-7% of your body weight can reverse insulin resistance.  ? Your health care provider can determine how much weight loss is best for you and can help you lose weight  safely.  · Follow a healthy meal plan. This includes eating lean proteins, complex carbohydrates, fresh fruits and vegetables, low-fat dairy products, and healthy fats.  ? Follow instructions from your health care provider about eating or drinking restrictions.  ? Make an appointment to see a diet and nutrition specialist (registered dietitian) to help you create a healthy eating plan that is right for you.  · Do not smoke or use any tobacco products, such as cigarettes, chewing tobacco, and e-cigarettes. If you need help quitting, ask your health care provider.  · Take over-the-counter and prescription medicines as told by your health care provider. You may be prescribed medicines that help lower the risk of type 2 diabetes.  · Keep all follow-up visits as told by your health care provider. This is important.  Summary  · Prediabetes is the condition of having a blood sugar (blood glucose) level that is higher than it should be, but not high enough for you to be diagnosed with type 2 diabetes.  · Having prediabetes puts you at risk for developing type 2 diabetes (type 2 diabetes mellitus).  · To help prevent type 2 diabetes, make lifestyle changes such as being physically active and eating a healthy diet. Lose weight as told by your health care provider.  This information is not intended to replace advice given to you by your health care provider. Make sure you discuss any questions you have with your health care provider.  Document Released: 04/14/2015 Document Revised: 08/10/2016 Document Reviewed: 02/11/2015  Elsevier Interactive Patient Education © 2019 Elsevier Inc.

## 2018-04-20 ENCOUNTER — Telehealth (HOSPITAL_COMMUNITY): Payer: Self-pay | Admitting: *Deleted

## 2018-04-20 ENCOUNTER — Encounter: Payer: Self-pay | Admitting: Physician Assistant

## 2018-04-20 ENCOUNTER — Ambulatory Visit: Payer: Self-pay | Admitting: Physician Assistant

## 2018-04-20 DIAGNOSIS — Z7689 Persons encountering health services in other specified circumstances: Secondary | ICD-10-CM

## 2018-04-20 DIAGNOSIS — F39 Unspecified mood [affective] disorder: Secondary | ICD-10-CM

## 2018-04-20 DIAGNOSIS — I10 Essential (primary) hypertension: Secondary | ICD-10-CM

## 2018-04-20 NOTE — Progress Notes (Signed)
There were no vitals taken for this visit.   Subjective:    Patient ID: Beth Sanford, female    DOB: 06/08/1970, 48 y.o.   MRN: [id number]  HPI: Beth Sanford is a 48 y.o. female presenting on 04/20/2018 for No chief complaint on file.   HPI   This is a telemedicine visit due to coronavirus pandemic.  It is a telephone visit due to pt unable to get her video to work.    I connected with  Beth Sanford on 04/20/18 by a video enabled telemedicine application and verified that I am speaking with the correct person using two identifiers.   I discussed the limitations of evaluation and management by telemedicine. The patient expressed understanding and agreed to proceed.    Pt was seen Monday this week for wanting a RTW note.  She was seen in ER on 3/22 for wanting a RTW note.   Pt says she didn't go to work yesterday because she had head congested and diarrhea and she wants note to return to work today.    Pt says she didn't know she would need a RTW note after being out yesterday.  Discussed with her that she should know since she needed note previously.  Twice.  She says she knows now.    Pt says she took some imodium and hasn't had any more diarrhea today.  She denies abdominal pain. Denies fever.  She is still having allergies with runny nose.   She is still taking lower dose losartan as she hasn't got the increased dose meds yet.   Relevant past medical, surgical, family and social history reviewed and updated as indicated. Interim medical history since our last visit reviewed. Allergies and medications reviewed and updated.    Current Outpatient Medications:  .  ALPRAZolam (XANAX) 1 MG tablet, Take 1 tablet (1 mg total) by mouth 4 (four) times daily., Disp: 120 tablet, Rfl: 2 .  Ca Carbonate-Mag Hydroxide (ROLAIDS PO), Take 2 tablets by mouth daily as needed (heartburn)., Disp: , Rfl:  .  cetirizine (ZYRTEC) 10 MG tablet, Take 10 mg by mouth daily., Disp: , Rfl:  .   citalopram (CELEXA) 20 MG tablet, Take 1 tablet (20 mg total) by mouth 2 (two) times daily., Disp: 60 tablet, Rfl: 2 .  diphenhydrAMINE (BENADRYL) 25 MG tablet, Take 25 mg by mouth every 6 (six) hours as needed., Disp: , Rfl:  .  hydrALAZINE (APRESOLINE) 25 MG tablet, Take 1 tablet (25 mg total) by mouth 3 (three) times daily., Disp: 270 tablet, Rfl: 0 .  hydrochlorothiazide (MICROZIDE) 12.5 MG capsule, Take 1 capsule (12.5 mg total) by mouth daily., Disp: 90 capsule, Rfl: 0 .  losartan (COZAAR) 100 MG tablet, Take 1 tablet (100 mg total) by mouth daily., Disp: 90 tablet, Rfl: 1 .  Omega-3 Fatty Acids (FISH OIL PO), Take 1 tablet by mouth daily. , Disp: , Rfl:  .  traZODone (DESYREL) 50 MG tablet, Take 1 tablet (50 mg total) by mouth at bedtime., Disp: 30 tablet, Rfl: 2    Review of Systems  Per HPI unless specifically indicated above     Objective:    There were no vitals taken for this visit.  Wt Readings from Last 3 Encounters:  03/26/18 160 lb 15 oz (73 kg)  02/28/18 161 lb (73 kg)  02/22/18 167 lb (75.8 kg)    Physical Exam Pulmonary:     Effort: Pulmonary effort is normal. No respiratory distress.  Comments: Pt speaks in full sentences and on and on with no need to stop due to dyspnea Neurological:     Mental Status: She is alert.           Assessment & Plan:   Encounter Diagnoses  Name Primary?  . Return to work evaluation Yes  . Mood disorder (HCC)   . Essential hypertension     -counseled pt to [place] office if she is laying out of work due to illness -RTW note will be faxed to same place note was faxed earlier this week -pt is encouraged to follow up with mental health specialist regularly per their recomendation -pt follow-up is moved out to July as pt wants to delay as long as possible because she says her employer isn't going to let her out again any time soon

## 2018-04-20 NOTE — Telephone Encounter (Signed)
I don't see anything more that I can do
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
## 2018-04-24 ENCOUNTER — Other Ambulatory Visit (HOSPITAL_COMMUNITY): Payer: Self-pay | Admitting: Psychiatry

## 2018-04-24 DIAGNOSIS — F331 Major depressive disorder, recurrent, moderate: Secondary | ICD-10-CM

## 2018-04-24 NOTE — Telephone Encounter (Signed)
You can let them know it is ok to fill it
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
## 2018-05-23 ENCOUNTER — Other Ambulatory Visit: Payer: Self-pay

## 2018-05-23 ENCOUNTER — Encounter (HOSPITAL_COMMUNITY): Payer: Self-pay | Admitting: Psychiatry

## 2018-05-23 ENCOUNTER — Ambulatory Visit (INDEPENDENT_AMBULATORY_CARE_PROVIDER_SITE_OTHER): Payer: Self-pay | Admitting: Psychiatry

## 2018-05-23 DIAGNOSIS — F331 Major depressive disorder, recurrent, moderate: Secondary | ICD-10-CM

## 2018-05-23 MED ORDER — TRAZODONE HCL 50 MG PO TABS: 50.0000 mg | ORAL_TABLET | Freq: Every day | ORAL | 2 refills | Status: DC

## 2018-05-23 MED ORDER — CITALOPRAM HYDROBROMIDE 20 MG PO TABS: 20.0000 mg | ORAL_TABLET | Freq: Two times a day (BID) | ORAL | 2 refills | Status: DC

## 2018-05-23 MED ORDER — ALPRAZOLAM 1 MG PO TABS: 1.0000 mg | ORAL_TABLET | Freq: Four times a day (QID) | ORAL | 2 refills | Status: DC
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
## 2018-06-13 ENCOUNTER — Ambulatory Visit: Payer: Self-pay | Admitting: Physician Assistant

## 2018-07-03 ENCOUNTER — Other Ambulatory Visit: Payer: Self-pay | Admitting: Physician Assistant

## 2018-07-03 DIAGNOSIS — E785 Hyperlipidemia, unspecified: Secondary | ICD-10-CM

## 2018-07-03 DIAGNOSIS — I10 Essential (primary) hypertension: Secondary | ICD-10-CM

## 2018-07-10 ENCOUNTER — Encounter: Payer: Self-pay | Admitting: Physician Assistant

## 2018-07-10 ENCOUNTER — Ambulatory Visit: Payer: Self-pay | Admitting: Physician Assistant

## 2018-07-10 DIAGNOSIS — F39 Unspecified mood [affective] disorder: Secondary | ICD-10-CM

## 2018-07-10 DIAGNOSIS — E785 Hyperlipidemia, unspecified: Secondary | ICD-10-CM

## 2018-07-10 DIAGNOSIS — L299 Pruritus, unspecified: Secondary | ICD-10-CM

## 2018-07-10 DIAGNOSIS — I10 Essential (primary) hypertension: Secondary | ICD-10-CM

## 2018-07-10 NOTE — Progress Notes (Signed)
There were no vitals taken for this visit.   Subjective:    Patient ID: Beth Sanford, female    DOB: 07/25/1970, 48 y.o.   MRN: [id number]  HPI: Beth Sanford is a 48 y.o. female presenting on 07/10/2018 for No chief complaint on file.   HPI   This is a telemedicine visit due to coronavirus pandemic. It is via telephone as pt does not have a cellular phone with video capabilities  I connected with  Beth Sanford on 07/10/18 by a video enabled telemedicine application and verified that I am speaking with the correct person using two identifiers.   I discussed the limitations of evaluation and management by telemedicine. The patient expressed understanding and agreed to proceed.  Pt is at home.  Provider is at office.    Pt is no longer working.  She says she is having a lot of stress.  She says it is due to the coronavirus pandemic.  She is currently living with a frined.    She is having a really hard time with mental health issues.    She is seeing Dr Tenny Crawoss for mental health issues.  Pt complains of itching.  She blames it on fleas- the person she is living with now has a cat with fleas  She says those are her only two problems currently.  She is not having any fever, sob, cp.   She did not get her labs drawn.   Relevant past medical, surgical, family and social history reviewed and updated as indicated. Interim medical history since our last visit reviewed. Allergies and medications reviewed and updated.   Current Outpatient Medications:  .  ALPRAZolam (XANAX) 1 MG tablet, Take 1 tablet (1 mg total) by mouth 4 (four) times daily., Disp: 120 tablet, Rfl: 2 .  citalopram (CELEXA) 20 MG tablet, Take 1 tablet (20 mg total) by mouth 2 (two) times daily., Disp: 60 tablet, Rfl: 2 .  hydrALAZINE (APRESOLINE) 25 MG tablet, Take 1 tablet (25 mg total) by mouth 3 (three) times daily., Disp: 270 tablet, Rfl: 0 .  losartan (COZAAR) 100 MG tablet, Take 1 tablet (100 mg total) by mouth daily.,  Disp: 90 tablet, Rfl: 1 .  Omega-3 Fatty Acids (FISH OIL PO), Take 1 tablet by mouth daily. , Disp: , Rfl:  .  traZODone (DESYREL) 50 MG tablet, Take 1 tablet (50 mg total) by mouth at bedtime., Disp: 30 tablet, Rfl: 2 .  Ca Carbonate-Mag Hydroxide (ROLAIDS PO), Take 2 tablets by mouth daily as needed (heartburn)., Disp: , Rfl:  .  cetirizine (ZYRTEC) 10 MG tablet, Take 10 mg by mouth daily., Disp: , Rfl:  .  diphenhydrAMINE (BENADRYL) 25 MG tablet, Take 25 mg by mouth every 6 (six) hours as needed., Disp: , Rfl:     Review of Systems      Per HPI unless specifically indicated above     Objective:    There were no vitals taken for this visit.  Wt Readings from Last 3 Encounters:  03/26/18 160 lb 15 oz (73 kg)  02/28/18 161 lb (73 kg)  02/13/18 154 lb 4 oz (70 kg)    Physical Exam Pulmonary:     Effort: No respiratory distress.  Neurological:     Mental Status: She is alert and oriented to person, place, and time.  Psychiatric:        Attention and Perception: Attention normal.        Speech: Speech normal.  Behavior: Behavior is cooperative.          Assessment & Plan:    Encounter Diagnoses  Name Primary?  . Essential hypertension Yes  . Mood disorder ([place])   . Pruritus   . Hyperlipidemia, unspecified hyperlipidemia type     -Pt to continue with Dr Harrington Challenger for [facility] issues -Recommended OTC benedryl and/or hydrocortisone cream to use as needed for itching. -pt to get fasting labs to check lipids, renal function.  She will be called with results -no changes to medication today. -pt encouraged to wear mask when in public per CDC guidelines -pt to follow up 3 months.  She is to contact office sooner prn

## 2018-07-17 ENCOUNTER — Other Ambulatory Visit (HOSPITAL_COMMUNITY)
Admission: RE | Admit: 2018-07-17 | Discharge: 2018-07-17 | Disposition: A | Discharge status: Home / Self Care | Payer: Self-pay | Source: Ambulatory Visit | Attending: Physician Assistant | Admitting: Physician Assistant

## 2018-07-17 DIAGNOSIS — E785 Hyperlipidemia, unspecified: Secondary | ICD-10-CM | POA: Insufficient documentation

## 2018-07-17 DIAGNOSIS — I10 Essential (primary) hypertension: Secondary | ICD-10-CM | POA: Insufficient documentation

## 2018-07-17 LAB — COMPREHENSIVE METABOLIC PANEL
ALT: 30 U/L (ref 0–44)
AST: 34 U/L (ref 15–41)
Albumin: 3.9 g/dL (ref 3.5–5.0)
Alkaline Phosphatase: 59 U/L (ref 38–126)
Anion gap: 11 (ref 5–15)
BUN: 9 mg/dL (ref 6–20)
CO2: 20 mmol/L — ABNORMAL LOW (ref 22–32)
Calcium: 8.5 mg/dL — ABNORMAL LOW (ref 8.9–10.3)
Chloride: 103 mmol/L (ref 98–111)
Creatinine, Ser: 0.64 mg/dL (ref 0.44–1.00)
GFR calc Af Amer: >60 mL/min (ref >60)
GFR calc non Af Amer: >60 mL/min (ref >60)
Glucose, Bld: 152 mg/dL — ABNORMAL HIGH (ref 70–99)
Potassium: 3.4 mmol/L — ABNORMAL LOW (ref 3.5–5.1)
Sodium: 134 mmol/L — ABNORMAL LOW (ref 135–145)
Total Bilirubin: 1.5 mg/dL — ABNORMAL HIGH (ref 0.3–1.2)
Total Protein: 7 g/dL (ref 6.5–8.1)

## 2018-07-17 LAB — LIPID PANEL
Cholesterol: 172 mg/dL (ref 0–200)
HDL: 55 mg/dL (ref >40)
LDL Cholesterol: 68 mg/dL (ref 0–99)
Total CHOL/HDL Ratio: 3.1 RATIO
Triglycerides: 247 mg/dL — ABNORMAL HIGH (ref <150)
VLDL: 49 mg/dL — ABNORMAL HIGH (ref 0–40)

## 2018-07-18 ENCOUNTER — Other Ambulatory Visit (HOSPITAL_COMMUNITY): Payer: Self-pay | Admitting: Psychiatry

## 2018-07-18 DIAGNOSIS — F331 Major depressive disorder, recurrent, moderate: Secondary | ICD-10-CM

## 2018-08-16 ENCOUNTER — Other Ambulatory Visit: Payer: Self-pay | Admitting: Physician Assistant

## 2018-08-16 ENCOUNTER — Other Ambulatory Visit (HOSPITAL_COMMUNITY): Payer: Self-pay | Admitting: Psychiatry

## 2018-08-16 DIAGNOSIS — F331 Major depressive disorder, recurrent, moderate: Secondary | ICD-10-CM

## 2018-08-17 ENCOUNTER — Other Ambulatory Visit: Payer: Self-pay | Admitting: Physician Assistant

## 2018-08-17 MED ORDER — HYDRALAZINE HCL 25 MG PO TABS: 25.0000 mg | ORAL_TABLET | Freq: Three times a day (TID) | ORAL | 1 refills | Status: DC

## 2018-08-17 MED ORDER — LOSARTAN POTASSIUM 100 MG PO TABS: 100.0000 mg | ORAL_TABLET | Freq: Every day | ORAL | 1 refills | Status: DC

## 2018-08-22 ENCOUNTER — Other Ambulatory Visit: Payer: Self-pay

## 2018-08-22 DIAGNOSIS — Z20822 Contact with and (suspected) exposure to covid-19: Secondary | ICD-10-CM

## 2018-08-23 ENCOUNTER — Telehealth (HOSPITAL_COMMUNITY): Payer: Self-pay | Admitting: *Deleted

## 2018-08-23 ENCOUNTER — Other Ambulatory Visit: Payer: Self-pay

## 2018-08-23 ENCOUNTER — Other Ambulatory Visit (HOSPITAL_COMMUNITY): Payer: Self-pay | Admitting: Psychiatry

## 2018-08-23 ENCOUNTER — Ambulatory Visit (HOSPITAL_COMMUNITY): Payer: Self-pay | Admitting: Psychiatry

## 2018-08-23 DIAGNOSIS — F331 Major depressive disorder, recurrent, moderate: Secondary | ICD-10-CM

## 2018-08-23 LAB — NOVEL CORONAVIRUS, NAA: SARS-CoV-2, NAA: NOT DETECTED

## 2018-08-23 MED ORDER — TRAZODONE HCL 50 MG PO TABS: 50.0000 mg | ORAL_TABLET | Freq: Every day | ORAL | 2 refills | Status: DC

## 2018-08-23 MED ORDER — ALPRAZOLAM 1 MG PO TABS: 1.0000 mg | ORAL_TABLET | Freq: Four times a day (QID) | ORAL | 2 refills | Status: DC

## 2018-08-23 MED ORDER — CITALOPRAM HYDROBROMIDE 20 MG PO TABS: 20.0000 mg | ORAL_TABLET | Freq: Two times a day (BID) | ORAL | 2 refills | Status: DC

## 2018-08-23 NOTE — Telephone Encounter (Signed)
Appt has been reschedule for 4/20 @ 3 pm verified working # in appt notes

## 2018-08-23 NOTE — Telephone Encounter (Signed)
Patient called so distraught that she missed her [place]. And requesting med's refill. Please send to [facility]

## 2018-08-23 NOTE — Telephone Encounter (Signed)
Sent, please reschedule her, she must have her phone on at time of appointment

## 2018-08-24 ENCOUNTER — Encounter (HOSPITAL_COMMUNITY): Payer: Self-pay | Admitting: Psychiatry

## 2018-08-24 ENCOUNTER — Ambulatory Visit (INDEPENDENT_AMBULATORY_CARE_PROVIDER_SITE_OTHER): Payer: Self-pay | Admitting: Psychiatry

## 2018-08-24 ENCOUNTER — Other Ambulatory Visit: Payer: Self-pay

## 2018-08-24 DIAGNOSIS — F411 Generalized anxiety disorder: Secondary | ICD-10-CM

## 2018-08-24 DIAGNOSIS — Z590 Homelessness: Secondary | ICD-10-CM

## 2018-08-24 DIAGNOSIS — F331 Major depressive disorder, recurrent, moderate: Secondary | ICD-10-CM

## 2018-08-24 MED ORDER — TRAZODONE HCL 50 MG PO TABS: 50.0000 mg | ORAL_TABLET | Freq: Every day | ORAL | 2 refills | Status: DC

## 2018-08-24 MED ORDER — CITALOPRAM HYDROBROMIDE 20 MG PO TABS: 20.0000 mg | ORAL_TABLET | Freq: Two times a day (BID) | ORAL | 2 refills | Status: DC
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
## 2018-08-28 ENCOUNTER — Other Ambulatory Visit (HOSPITAL_COMMUNITY): Payer: Self-pay | Admitting: Psychiatry

## 2018-08-28 MED ORDER — BUSPIRONE HCL 10 MG PO TABS: 10.0000 mg | ORAL_TABLET | Freq: Three times a day (TID) | ORAL | 2 refills | Status: DC

## 2018-09-21 ENCOUNTER — Telehealth (HOSPITAL_COMMUNITY): Payer: Self-pay | Admitting: *Deleted

## 2018-09-21 NOTE — Telephone Encounter (Signed)
I have stopped xanax, she won't be getting any more
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
## 2018-10-05 ENCOUNTER — Other Ambulatory Visit: Payer: Self-pay | Admitting: Physician Assistant

## 2018-10-05 DIAGNOSIS — E785 Hyperlipidemia, unspecified: Secondary | ICD-10-CM

## 2018-10-05 DIAGNOSIS — I10 Essential (primary) hypertension: Secondary | ICD-10-CM

## 2018-10-16 ENCOUNTER — Other Ambulatory Visit (HOSPITAL_COMMUNITY)
Admission: RE | Admit: 2018-10-16 | Discharge: 2018-10-16 | Disposition: A | Discharge status: Home / Self Care | Payer: Self-pay | Source: Ambulatory Visit | Attending: Physician Assistant | Admitting: Physician Assistant

## 2018-10-16 DIAGNOSIS — I10 Essential (primary) hypertension: Secondary | ICD-10-CM | POA: Insufficient documentation

## 2018-10-16 DIAGNOSIS — E785 Hyperlipidemia, unspecified: Secondary | ICD-10-CM | POA: Insufficient documentation

## 2018-10-16 LAB — LIPID PANEL
Cholesterol: 252 mg/dL — ABNORMAL HIGH (ref 0–200)
HDL: 73 mg/dL (ref >40)
LDL Cholesterol: 145 mg/dL — ABNORMAL HIGH (ref 0–99)
Total CHOL/HDL Ratio: 3.5 RATIO
Triglycerides: 172 mg/dL — ABNORMAL HIGH (ref <150)
VLDL: 34 mg/dL (ref 0–40)

## 2018-10-16 LAB — COMPREHENSIVE METABOLIC PANEL
ALT: 34 U/L (ref 0–44)
AST: 27 U/L (ref 15–41)
Albumin: 4.3 g/dL (ref 3.5–5.0)
Alkaline Phosphatase: 60 U/L (ref 38–126)
Anion gap: 12 (ref 5–15)
BUN: 9 mg/dL (ref 6–20)
CO2: 22 mmol/L (ref 22–32)
Calcium: 9.3 mg/dL (ref 8.9–10.3)
Chloride: 105 mmol/L (ref 98–111)
Creatinine, Ser: 0.78 mg/dL (ref 0.44–1.00)
GFR calc Af Amer: >60 mL/min (ref >60)
GFR calc non Af Amer: >60 mL/min (ref >60)
Glucose, Bld: 156 mg/dL — ABNORMAL HIGH (ref 70–99)
Potassium: 3.7 mmol/L (ref 3.5–5.1)
Sodium: 139 mmol/L (ref 135–145)
Total Bilirubin: 0.9 mg/dL (ref 0.3–1.2)
Total Protein: 7.8 g/dL (ref 6.5–8.1)

## 2018-10-17 ENCOUNTER — Ambulatory Visit: Payer: Self-pay | Admitting: Physician Assistant

## 2018-10-24 ENCOUNTER — Encounter: Payer: Self-pay | Admitting: Physician Assistant

## 2018-10-24 ENCOUNTER — Ambulatory Visit: Payer: Self-pay | Admitting: Physician Assistant

## 2018-10-24 ENCOUNTER — Other Ambulatory Visit: Payer: Self-pay

## 2018-10-24 VITALS — BP 150/100 | HR 88 | Temp 96.3°F

## 2018-10-24 DIAGNOSIS — E785 Hyperlipidemia, unspecified: Secondary | ICD-10-CM

## 2018-10-24 DIAGNOSIS — Z9119 Patient's noncompliance with other medical treatment and regimen: Secondary | ICD-10-CM

## 2018-10-24 DIAGNOSIS — I10 Essential (primary) hypertension: Secondary | ICD-10-CM

## 2018-10-24 DIAGNOSIS — Z2821 Immunization not carried out because of patient refusal: Secondary | ICD-10-CM

## 2018-10-24 DIAGNOSIS — R7303 Prediabetes: Secondary | ICD-10-CM

## 2018-10-24 DIAGNOSIS — F39 Unspecified mood [affective] disorder: Secondary | ICD-10-CM

## 2018-10-24 DIAGNOSIS — Z91199 Patient's noncompliance with other medical treatment and regimen due to unspecified reason: Secondary | ICD-10-CM

## 2018-10-24 NOTE — Patient Instructions (Signed)

## 2018-10-24 NOTE — Progress Notes (Signed)
BP (!) 150/100   Pulse 88   Temp (!) 96.3 F (35.7 C)   SpO2 97%    Subjective:    Patient ID: Beth Sanford, female    DOB: [birth year], 48 y.o.   MRN: [id number]  HPI: Beth Sanford is a 48 y.o. female presenting on 10/24/2018 for Hypertension   HPI   Pt had a negative covid 19 screening questionnaire   She Still sees dr Harrington Challenger for [facility].     Pt says she is doing okay and she has no complaints today.  She says she is in process of moving and so she hasn't taken her bp meds for a couple of days.     Relevant past medical, surgical, family and social history reviewed and updated as indicated. Interim medical history since our last visit reviewed. Allergies and medications reviewed and updated.   Current Outpatient Medications:  .  Ca Carbonate-Mag Hydroxide (ROLAIDS PO), Take 2 tablets by mouth daily as needed (heartburn)., Disp: , Rfl:  .  cetirizine (ZYRTEC) 10 MG tablet, Take 10 mg by mouth daily., Disp: , Rfl:  .  citalopram (CELEXA) 20 MG tablet, Take 1 tablet (20 mg total) by mouth 2 (two) times daily. (Patient taking differently: Take 20 mg by mouth daily. ), Disp: 60 tablet, Rfl: 2 .  diphenhydrAMINE (BENADRYL) 25 MG tablet, Take 25 mg by mouth every 6 (six) hours as needed., Disp: , Rfl:  .  HYDROCHLOROTHIAZIDE PO, Take 1 tablet by mouth daily., Disp: , Rfl:  .  losartan (COZAAR) 100 MG tablet, Take 1 tablet (100 mg total) by mouth daily., Disp: 90 tablet, Rfl: 1 .  Omega-3 Fatty Acids (FISH OIL PO), Take 1 tablet by mouth daily. , Disp: , Rfl:  .  traZODone (DESYREL) 50 MG tablet, Take 1 tablet (50 mg total) by mouth at bedtime. (Patient taking differently: Take 100 mg by mouth at bedtime. ), Disp: 30 tablet, Rfl: 2 .  busPIRone (BUSPAR) 10 MG tablet, Take 1 tablet (10 mg total) by mouth 3 (three) times daily. (Patient not taking: Reported on 10/24/2018), Disp: 90 tablet, Rfl: 2 .  hydrALAZINE (APRESOLINE) 25 MG tablet, Take 1 tablet (25 mg total) by mouth 3 (three) times  daily. (Patient not taking: Reported on 10/24/2018), Disp: 270 tablet, Rfl: 1   Review of Systems  Per HPI unless specifically indicated above     Objective:    BP (!) 150/100   Pulse 88   Temp (!) 96.3 F (35.7 C)   SpO2 97%   Wt Readings from Last 3 Encounters:  03/26/18 160 lb 15 oz (73 kg)  02/28/18 161 lb (73 kg)  02/13/18 154 lb 4 oz (70 kg)    Physical Exam Vitals signs reviewed.  Constitutional:      General: She is not in acute distress.    Appearance: She is well-developed. She is not ill-appearing.  HENT:     Head: Normocephalic and atraumatic.  Neck:     Musculoskeletal: Neck supple.  Cardiovascular:     Rate and Rhythm: Normal rate and regular rhythm.  Pulmonary:     Effort: Pulmonary effort is normal.     Breath sounds: Normal breath sounds.  Abdominal:     General: Bowel sounds are normal.     Palpations: Abdomen is soft. There is no mass.     Tenderness: There is no abdominal tenderness.  Musculoskeletal:     Right lower leg: No edema.  Left lower leg: No edema.  Lymphadenopathy:     Cervical: No cervical adenopathy.  Skin:    General: Skin is warm and dry.  Neurological:     Mental Status: She is alert and oriented to person, place, and time.  Psychiatric:        Attention and Perception: Attention normal.        Speech: Speech normal.        Behavior: Behavior is cooperative.     Results for orders placed or performed during the hospital encounter of 10/16/18  Lipid panel  Result Value Ref Range   Cholesterol 252 (H) 0 - 200 mg/dL   Triglycerides 932 (H) <150 mg/dL   HDL 73 >35 mg/dL   Total CHOL/HDL Ratio 3.5 RATIO   VLDL 34 0 - 40 mg/dL   LDL Cholesterol 573 (H) 0 - 99 mg/dL  Comprehensive metabolic panel  Result Value Ref Range   Sodium 139 135 - 145 mmol/L   Potassium 3.7 3.5 - 5.1 mmol/L   Chloride 105 98 - 111 mmol/L   CO2 22 22 - 32 mmol/L   Glucose, Bld 156 (H) 70 - 99 mg/dL   BUN 9 6 - 20 mg/dL   Creatinine, Ser 2.20  0.44 - 1.00 mg/dL   Calcium 9.3 8.9 - 25.4 mg/dL   Total Protein 7.8 6.5 - 8.1 g/dL   Albumin 4.3 3.5 - 5.0 g/dL   AST 27 15 - 41 U/L   ALT 34 0 - 44 U/L   Alkaline Phosphatase 60 38 - 126 U/L   Total Bilirubin 0.9 0.3 - 1.2 mg/dL   GFR calc non Af Amer >60 >60 mL/min   GFR calc Af Amer >60 >60 mL/min   Anion gap 12 5 - 15      Assessment & Plan:     Encounter Diagnoses  Name Primary?  . Essential hypertension Yes  . Hyperlipidemia, unspecified hyperlipidemia type   . Mood disorder (HCC)   . Personal history of noncompliance with medical treatment, presenting hazards to health   . Influenza vaccination declined      -reviewed labs with pt.   -discussed increase in lipids from 3 months ago.  Discussed that she needs to get back to eating healthier, lowfat diet -discussed with pt that her appointment in the office today was to check her BP and this is a wasted trip for her since she isn't taking her meds so there is no way to know if her meds are properly controlling her HTN.  Encouraged her to take her meds regularly.  She states understanding -pt to continue with current MH specialist -Pt declines influenza immunization -pt to follow up  3 months.  She is to contact office sooner prn

## 2018-12-20 ENCOUNTER — Other Ambulatory Visit: Payer: Self-pay

## 2018-12-20 ENCOUNTER — Ambulatory Visit: Payer: HRSA Program | Attending: Internal Medicine

## 2018-12-20 DIAGNOSIS — Z20828 Contact with and (suspected) exposure to other viral communicable diseases: Secondary | ICD-10-CM | POA: Insufficient documentation

## 2018-12-20 DIAGNOSIS — Z20822 Contact with and (suspected) exposure to covid-19: Secondary | ICD-10-CM

## 2018-12-21 NOTE — Progress Notes (Signed)
Order(s) created erroneously. Erroneous order ID: [id number]  Order moved by: Martino Tompson M  Order move date/time: 12/21/2018 1:30 PM  Source Patient: [id number]  Source Contact: 12/20/2018  Destination Patient: [id number]  Destination Contact: 12/20/2018 

## 2018-12-21 NOTE — Progress Notes (Signed)
Moving orders to this encounter.  

## 2018-12-21 NOTE — Progress Notes (Signed)
Order(s) created erroneously. Erroneous order ID: [id number]  Order moved by: Brigitte Pulse  Order move date/time: 12/21/2018 1:30 PM  Source Patient: [id number]  Source Contact: 12/20/2018  Destination Patient: [id number]  Destination Contact: 12/20/2018

## 2018-12-22 LAB — NOVEL CORONAVIRUS, NAA: SARS-CoV-2, NAA: NOT DETECTED

## 2019-01-24 ENCOUNTER — Ambulatory Visit: Payer: Self-pay | Admitting: Physician Assistant

## 2019-03-28 DIAGNOSIS — F339 Major depressive disorder, recurrent, unspecified: Secondary | ICD-10-CM

## 2019-05-16 ENCOUNTER — Other Ambulatory Visit: Payer: Self-pay

## 2019-05-16 ENCOUNTER — Encounter (HOSPITAL_COMMUNITY): Payer: Self-pay | Admitting: Emergency Medicine

## 2019-05-16 ENCOUNTER — Emergency Department (HOSPITAL_COMMUNITY)
Admission: EM | Admit: 2019-05-16 | Discharge: 2019-05-16 | Disposition: A | Discharge status: Home / Self Care | Payer: Self-pay | Attending: Emergency Medicine | Admitting: Emergency Medicine

## 2019-05-16 DIAGNOSIS — K921 Melena: Secondary | ICD-10-CM | POA: Insufficient documentation

## 2019-05-16 DIAGNOSIS — I509 Heart failure, unspecified: Secondary | ICD-10-CM | POA: Insufficient documentation

## 2019-05-16 DIAGNOSIS — R197 Diarrhea, unspecified: Secondary | ICD-10-CM | POA: Insufficient documentation

## 2019-05-16 DIAGNOSIS — Z79899 Other long term (current) drug therapy: Secondary | ICD-10-CM | POA: Insufficient documentation

## 2019-05-16 DIAGNOSIS — R112 Nausea with vomiting, unspecified: Secondary | ICD-10-CM | POA: Insufficient documentation

## 2019-05-16 DIAGNOSIS — Z87891 Personal history of nicotine dependence: Secondary | ICD-10-CM | POA: Insufficient documentation

## 2019-05-16 DIAGNOSIS — I110 Hypertensive heart disease with heart failure: Secondary | ICD-10-CM | POA: Insufficient documentation

## 2019-05-16 LAB — COMPREHENSIVE METABOLIC PANEL
ALT: 17 U/L (ref 0–44)
AST: 15 U/L (ref 15–41)
Albumin: 3.5 g/dL (ref 3.5–5.0)
Alkaline Phosphatase: 57 U/L (ref 38–126)
Anion gap: 9 (ref 5–15)
BUN: 26 mg/dL — ABNORMAL HIGH (ref 6–20)
CO2: 22 mmol/L (ref 22–32)
Calcium: 8.2 mg/dL — ABNORMAL LOW (ref 8.9–10.3)
Chloride: 103 mmol/L (ref 98–111)
Creatinine, Ser: 0.66 mg/dL (ref 0.44–1.00)
GFR calc Af Amer: >60 mL/min (ref >60)
GFR calc non Af Amer: >60 mL/min (ref >60)
Glucose, Bld: 178 mg/dL — ABNORMAL HIGH (ref 70–99)
Potassium: 3.7 mmol/L (ref 3.5–5.1)
Sodium: 134 mmol/L — ABNORMAL LOW (ref 135–145)
Total Bilirubin: 0.7 mg/dL (ref 0.3–1.2)
Total Protein: 6.5 g/dL (ref 6.5–8.1)

## 2019-05-16 LAB — URINALYSIS, ROUTINE W REFLEX MICROSCOPIC
Bilirubin Urine: NEGATIVE
Glucose, UA: NEGATIVE mg/dL
Ketones, ur: NEGATIVE mg/dL
Leukocytes,Ua: NEGATIVE
Nitrite: NEGATIVE
Protein, ur: NEGATIVE mg/dL
Specific Gravity, Urine: 1.012 (ref 1.005–1.030)
pH: 5 (ref 5.0–8.0)

## 2019-05-16 LAB — HEMOGLOBIN AND HEMATOCRIT, BLOOD
HCT: 36.7 % (ref 36.0–46.0)
Hemoglobin: 11.8 g/dL — ABNORMAL LOW (ref 12.0–15.0)

## 2019-05-16 LAB — POC OCCULT BLOOD, ED: Fecal Occult Bld: POSITIVE — AB

## 2019-05-16 LAB — CBC
HCT: 37.6 % (ref 36.0–46.0)
Hemoglobin: 12 g/dL (ref 12.0–15.0)
MCH: 32 pg (ref 26.0–34.0)
MCHC: 31.9 g/dL (ref 30.0–36.0)
MCV: 100.3 fL — ABNORMAL HIGH (ref 80.0–100.0)
Platelets: 252 10*3/uL (ref 150–400)
RBC: 3.75 MIL/uL — ABNORMAL LOW (ref 3.87–5.11)
RDW: 12.1 % (ref 11.5–15.5)
WBC: 8.9 10*3/uL (ref 4.0–10.5)
nRBC: 0 % (ref 0.0–0.2)

## 2019-05-16 LAB — LIPASE, BLOOD: Lipase: 23 U/L (ref 11–51)

## 2019-05-16 MED ORDER — ONDANSETRON HCL 4 MG PO TABS
4.0000 mg | ORAL_TABLET | Freq: Four times a day (QID) | ORAL | 0 refills | Status: DC
Start: 2019-05-16 — End: 2019-08-27

## 2019-05-16 MED ORDER — ONDANSETRON HCL 4 MG/2ML IJ SOLN
4.0000 mg | Freq: Once | INTRAMUSCULAR | Status: AC
  Administered 2019-05-16: 4 mg via INTRAVENOUS
  Filled 2019-05-16: qty 2

## 2019-05-16 MED ORDER — OMEPRAZOLE 40 MG PO CPDR
40.0000 mg | DELAYED_RELEASE_CAPSULE | Freq: Every day | ORAL | 0 refills | Status: DC
Start: 2019-05-16 — End: 2019-05-21

## 2019-05-16 MED ORDER — SODIUM CHLORIDE 0.9 % IV BOLUS
1000.0000 mL | Freq: Once | INTRAVENOUS | Status: AC
  Administered 2019-05-16: 1000 mL via INTRAVENOUS

## 2019-05-16 MED ORDER — PANTOPRAZOLE SODIUM 40 MG IV SOLR
40.0000 mg | Freq: Once | INTRAVENOUS | Status: AC
  Administered 2019-05-16: 40 mg via INTRAVENOUS
  Filled 2019-05-16: qty 40
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
## 2019-05-16 NOTE — ED Notes (Signed)
Warm blanket given to patient, pt sitting on side of bed dressed and demanding RN to take IV out.

## 2019-05-16 NOTE — ED Triage Notes (Addendum)
Pt reports emesis and "black watery " diarrhea, headache since 630 this am. Pt reports has been taking pepto bismol and mylanta with no relief of symptoms.

## 2019-05-16 NOTE — ED Provider Notes (Signed)
[facility] EMERGENCY DEPARTMENT Provider Note   CSN: [id number] Arrival date & time: 05/16/19  2426     History Chief Complaint  Patient presents with  . Emesis    Beth Sanford is a 49 y.o. female with a history as outlined below, significant for CHF, hypertension presenting with a 12 hour history of vomiting and diarrhea.  She reports being at a fair yesterday and ate hotdogs, then around 6 pm developed vomiting and diarrhea, multiple episodes throughout the night not relieved by pepto bismol or mylanta.  She endorses dark watery stools (which started prior to taking the pepto bismol). She reports generalized fatigue.  She denies fevers, abdominal pain or distention, dysuria, also no hematemesis, denies history of PUD, reports occasional heartburn, but not bad, denies other stomach complaints. She tried to work this morning ([employer]) but was sent here by her supervisor.    HPI     Past Medical History:  Diagnosis Date  . Anxiety   . CHF (congestive heart failure) (HCC)   . Depression   . High cholesterol   . Hypertension   . OSA (obstructive sleep apnea) 09/13/2014    Patient Active Problem List   Diagnosis Date Noted  . Abnormal LFTs 09/16/2015  . Tobacco abuse 12/18/2014  . OSA (obstructive sleep apnea) 09/13/2014  . Major depression 06/08/2013  . Chest pain 11/01/2012  . Essential hypertension, malignant 11/01/2012  . Hyperlipidemia 11/01/2012    Past Surgical History:  Procedure Laterality Date  . CHOLECYSTECTOMY    . ELBOW SURGERY  left     OB History    Gravida  3   Para  1   Term      Preterm      AB  2   Living        SAB      TAB      Ectopic      Multiple      Live Births              Family History  Problem Relation Age of Onset  . Alcohol abuse Father   . Heart failure Father        deceased at age 9  . Hypertension Brother   . Bipolar disorder Brother   . Emphysema Maternal Grandfather   . Colon cancer Neg Hx      Social History   Tobacco Use  . Smoking status: Former Smoker    Packs/day: 0.50    Years: 27.00    Pack years: 13.50    Types: Cigarettes    Start date: 01/05/1984    Quit date: 01/05/2012    Years since quitting: 7.3  . Smokeless tobacco: Never Used  Substance Use Topics  . Alcohol use: No    Alcohol/week: 0.0 standard drinks  . Drug use: No    Home Medications Prior to Admission medications   Medication Sig Start Date End Date Taking? Authorizing Provider  busPIRone (BUSPAR) 10 MG tablet Take 1 tablet (10 mg total) by mouth 3 (three) times daily. Patient not taking: Reported on 10/24/2018 08/28/18   Myrlene Broker, MD  citalopram (CELEXA) 20 MG tablet Take 1 tablet (20 mg total) by mouth 2 (two) times daily. Patient not taking: Reported on 05/16/2019 08/24/18 08/24/19  Myrlene Broker, MD  hydrALAZINE (APRESOLINE) 25 MG tablet Take 1 tablet (25 mg total) by mouth 3 (three) times daily. Patient not taking: Reported on 10/24/2018 08/17/18   Jacquelin Hawking, PA-C  losartan (COZAAR) 100 MG tablet Take 1 tablet (100 mg total) by mouth daily. Patient not taking: Reported on 05/16/2019 08/17/18   Soyla Dryer, PA-C  omeprazole (PRILOSEC) 40 MG capsule Take 1 capsule (40 mg total) by mouth daily. 05/16/19   Melva Faux, Almyra Free, PA-C  ondansetron (ZOFRAN) 4 MG tablet Take 1 tablet (4 mg total) by mouth every 6 (six) hours. 05/16/19   Evalee Jefferson, PA-C  traZODone (DESYREL) 50 MG tablet Take 1 tablet (50 mg total) by mouth at bedtime. Patient not taking: Reported on 05/16/2019 08/24/18   Cloria Spring, MD    Allergies    Codeine and Tramadol  Review of Systems   Review of Systems  Constitutional: Negative for chills and fever.  HENT: Negative for congestion and sore throat.   Eyes: Negative.   Respiratory: Negative for chest tightness and shortness of breath.   Cardiovascular: Negative for chest pain.  Gastrointestinal: Positive for diarrhea, nausea and vomiting. Negative for abdominal  distention and abdominal pain.  Genitourinary: Negative.  Negative for dysuria.  Musculoskeletal: Negative for arthralgias, joint swelling and neck pain.  Skin: Negative.  Negative for rash and wound.  Neurological: Negative for dizziness, weakness, light-headedness, numbness and headaches.  Psychiatric/Behavioral: Negative.     Physical Exam Updated Vital Signs BP 113/66   Pulse 72   Temp 97.6 F (36.4 C) (Oral)   Resp 18   Ht 5\' 2"  (1.575 m)   Wt 76.2 kg   SpO2 98%   BMI 30.73 kg/m   Physical Exam Vitals and nursing note reviewed. Exam conducted with a chaperone present.  Constitutional:      Appearance: She is well-developed.  HENT:     Head: Normocephalic and atraumatic.  Eyes:     Conjunctiva/sclera: Conjunctivae normal.  Cardiovascular:     Rate and Rhythm: Normal rate and regular rhythm.     Heart sounds: Normal heart sounds.  Pulmonary:     Effort: Pulmonary effort is normal.     Breath sounds: Normal breath sounds. No wheezing.  Abdominal:     General: Abdomen is protuberant. Bowel sounds are increased. There is no distension.     Palpations: Abdomen is soft.     Tenderness: There is no abdominal tenderness. There is no guarding or rebound.  Genitourinary:    Rectum: Guaiac result positive. No mass or external hemorrhoid.     Comments: Black residue per rectal exam.   Musculoskeletal:        General: Normal range of motion.     Cervical back: Normal range of motion.  Skin:    General: Skin is warm and dry.  Neurological:     Mental Status: She is alert.     ED Results / Procedures / Treatments   Labs (all labs ordered are listed, but only abnormal results are displayed) Labs Reviewed  COMPREHENSIVE METABOLIC PANEL - Abnormal; Notable for the following components:      Result Value   Sodium 134 (*)    Glucose, Bld 178 (*)    BUN 26 (*)    Calcium 8.2 (*)    All other components within normal limits  CBC - Abnormal; Notable for the following  components:   RBC 3.75 (*)    MCV 100.3 (*)    All other components within normal limits  URINALYSIS, ROUTINE W REFLEX MICROSCOPIC - Abnormal; Notable for the following components:   Color, Urine STRAW (*)    APPearance HAZY (*)    Hgb urine dipstick  MODERATE (*)    Bacteria, UA RARE (*)    All other components within normal limits  HEMOGLOBIN AND HEMATOCRIT, BLOOD - Abnormal; Notable for the following components:   Hemoglobin 11.8 (*)    All other components within normal limits  POC OCCULT BLOOD, ED - Abnormal; Notable for the following components:   Fecal Occult Bld POSITIVE (*)    All other components within normal limits  LIPASE, BLOOD    EKG None  Radiology No results found.  Procedures Procedures (including critical care time)  Medications Ordered in ED Medications  sodium chloride 0.9 % bolus 1,000 mL (0 mLs Intravenous Stopped 05/16/19 1040)  ondansetron (ZOFRAN) injection 4 mg (4 mg Intravenous Given 05/16/19 0930)  pantoprazole (PROTONIX) injection 40 mg (40 mg Intravenous Given 05/16/19 1113)    ED Course  I have reviewed the triage vital signs and the nursing notes.  Pertinent labs & imaging results that were available during my care of the patient were reviewed by me and considered in my medical decision making (see chart for details).    MDM Rules/Calculators/A&P                      Patient with symptoms suggesting gastroenteritis including vomiting and diarrhea, however she is Hemoccult positive today.  Patient's labs are stable however with a normal hemoglobin level of 12.0 her BUN is slightly bumped at 26 and she has a normal creatinine.  She denies any significant upper GI complaints such as chronic GERD, no history of PUD.  She reports an occasional less than 1 tablet/week Aleve or other NSAID.  No aspirin use.  Discussed patient with Dr. Karilyn Cota who recommended a repeat H&H if this is stable she can be discharged home with close GI follow-up.  Her  hemoglobin was repeated and it was 11.8, this was after receiving a liter of fluid and so is a stable number.  She had no further vomiting or diarrhea while in the department.  Very possibly had a Mallory-Weiss tear as a source of her blood.  She was given strict return precautions for any weakness, dizziness, return of dark or bloody diarrhea or development of any hematemesis.  She had no complaints at time of discharge.  She was given referral to Dr. Karilyn Cota for follow-up care early next week.  She was prescribed Zofran, also placed on Prilosec.  She was given IV dose of Protonix prior to discharge home. Final Clinical Impression(s) / ED Diagnoses Final diagnoses:  Nausea vomiting and diarrhea  Blood in stool    Rx / DC Orders ED Discharge Orders         Ordered    omeprazole (PRILOSEC) 40 MG capsule  Daily     05/16/19 1307    ondansetron (ZOFRAN) 4 MG tablet  Every 6 hours     05/16/19 1311           Burgess Amor, PA-C 05/16/19 1553    Long, Arlyss Repress, MD 05/17/19 [phone]

## 2019-05-16 NOTE — ED Notes (Addendum)
Pt yelling at RN stating "I'm not staying here any longer, take this IV out. I'm leaving. Give me some papers saying I have been here." Informed pt EDP is awaiting [place] from gastro doctor and if she is leaving against medical leave then she needs to sign form. Pt states "I need eat." EDP at bedside informed of AMA and advise against leaving. Pt states "I can't keep staying in this hospital." IV removed per pt request.

## 2019-05-16 NOTE — Discharge Instructions (Signed)
Take the medication prescribed to help protect and heal your stomach assuming the blood we found today is from gastritis or stomach ulcer.  You may continue taking the Zofran if needed for nausea or vomiting.  It is recommended that you see Dr. Karilyn Cota as discussed, [place] his office for an appointment early next week.  In the interim return here if you develop any new or worsened symptoms.

## 2019-05-16 NOTE — ED Notes (Signed)
Pt called out and states she is hungry, cold and wants to go home. EDP made aware.

## 2019-05-21 ENCOUNTER — Ambulatory Visit (INDEPENDENT_AMBULATORY_CARE_PROVIDER_SITE_OTHER): Payer: Self-pay | Admitting: Internal Medicine

## 2019-05-21 ENCOUNTER — Other Ambulatory Visit: Payer: Self-pay

## 2019-05-21 ENCOUNTER — Encounter (INDEPENDENT_AMBULATORY_CARE_PROVIDER_SITE_OTHER): Payer: Self-pay | Admitting: *Deleted

## 2019-05-21 ENCOUNTER — Encounter (INDEPENDENT_AMBULATORY_CARE_PROVIDER_SITE_OTHER): Payer: Self-pay | Admitting: Internal Medicine

## 2019-05-21 ENCOUNTER — Other Ambulatory Visit (INDEPENDENT_AMBULATORY_CARE_PROVIDER_SITE_OTHER): Payer: Self-pay | Admitting: *Deleted

## 2019-05-21 DIAGNOSIS — I10 Essential (primary) hypertension: Secondary | ICD-10-CM | POA: Insufficient documentation

## 2019-05-21 DIAGNOSIS — K921 Melena: Secondary | ICD-10-CM

## 2019-05-21 MED ORDER — OMEPRAZOLE 40 MG PO CPDR: 40.0000 mg | DELAYED_RELEASE_CAPSULE | Freq: Every day | ORAL | 2 refills | Status: DC

## 2019-05-21 MED ORDER — LOSARTAN POTASSIUM 100 MG PO TABS: 100.0000 mg | ORAL_TABLET | Freq: Every day | ORAL | 5 refills | Status: DC
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
## 2019-05-21 NOTE — Patient Instructions (Signed)
Please do not take aspirin Advil Aleve BC Goody powder or similar medications Can take Tylenol on as-needed basis.  Daily dose not to exceed 2 g. Esophagogastroduodenoscopy to be scheduled. Will check hemoglobin at the time of endoscopy.
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
## 2019-05-28 ENCOUNTER — Other Ambulatory Visit: Payer: Self-pay

## 2019-05-28 ENCOUNTER — Other Ambulatory Visit (HOSPITAL_COMMUNITY)
Admission: RE | Admit: 2019-05-28 | Discharge: 2019-05-28 | Disposition: A | Discharge status: Home / Self Care | Payer: HRSA Program | Source: Ambulatory Visit | Attending: Internal Medicine | Admitting: Internal Medicine

## 2019-05-28 DIAGNOSIS — Z01812 Encounter for preprocedural laboratory examination: Secondary | ICD-10-CM | POA: Diagnosis present

## 2019-05-28 DIAGNOSIS — Z20822 Contact with and (suspected) exposure to covid-19: Secondary | ICD-10-CM | POA: Diagnosis not present

## 2019-05-29 LAB — SARS CORONAVIRUS 2 (TAT 6-24 HRS): SARS Coronavirus 2: NEGATIVE

## 2019-05-30 ENCOUNTER — Encounter (HOSPITAL_COMMUNITY)
Admission: RE | Disposition: A | Discharge status: Home / Self Care | Payer: Self-pay | Source: Home / Self Care | Attending: Internal Medicine

## 2019-05-30 ENCOUNTER — Ambulatory Visit (HOSPITAL_COMMUNITY)
Admission: RE | Admit: 2019-05-30 | Discharge: 2019-05-30 | Disposition: A | Discharge status: Home / Self Care | Payer: Self-pay | Attending: Internal Medicine | Admitting: Internal Medicine

## 2019-05-30 ENCOUNTER — Other Ambulatory Visit: Payer: Self-pay

## 2019-05-30 ENCOUNTER — Encounter (HOSPITAL_COMMUNITY): Payer: Self-pay | Admitting: Internal Medicine

## 2019-05-30 DIAGNOSIS — Z87891 Personal history of nicotine dependence: Secondary | ICD-10-CM | POA: Insufficient documentation

## 2019-05-30 DIAGNOSIS — K921 Melena: Secondary | ICD-10-CM | POA: Insufficient documentation

## 2019-05-30 DIAGNOSIS — K269 Duodenal ulcer, unspecified as acute or chronic, without hemorrhage or perforation: Secondary | ICD-10-CM | POA: Insufficient documentation

## 2019-05-30 DIAGNOSIS — F329 Major depressive disorder, single episode, unspecified: Secondary | ICD-10-CM | POA: Insufficient documentation

## 2019-05-30 DIAGNOSIS — F419 Anxiety disorder, unspecified: Secondary | ICD-10-CM | POA: Insufficient documentation

## 2019-05-30 DIAGNOSIS — Z9049 Acquired absence of other specified parts of digestive tract: Secondary | ICD-10-CM | POA: Insufficient documentation

## 2019-05-30 DIAGNOSIS — K209 Esophagitis, unspecified without bleeding: Secondary | ICD-10-CM

## 2019-05-30 DIAGNOSIS — K2980 Duodenitis without bleeding: Secondary | ICD-10-CM

## 2019-05-30 DIAGNOSIS — K2270 Barrett's esophagus without dysplasia: Secondary | ICD-10-CM | POA: Insufficient documentation

## 2019-05-30 DIAGNOSIS — I509 Heart failure, unspecified: Secondary | ICD-10-CM | POA: Insufficient documentation

## 2019-05-30 DIAGNOSIS — K228 Other specified diseases of esophagus: Secondary | ICD-10-CM

## 2019-05-30 DIAGNOSIS — I110 Hypertensive heart disease with heart failure: Secondary | ICD-10-CM | POA: Insufficient documentation

## 2019-05-30 DIAGNOSIS — Z79899 Other long term (current) drug therapy: Secondary | ICD-10-CM | POA: Insufficient documentation

## 2019-05-30 DIAGNOSIS — D649 Anemia, unspecified: Secondary | ICD-10-CM

## 2019-05-30 DIAGNOSIS — G4733 Obstructive sleep apnea (adult) (pediatric): Secondary | ICD-10-CM | POA: Insufficient documentation

## 2019-05-30 HISTORY — PX: BIOPSY: SHX5522

## 2019-05-30 HISTORY — PX: ESOPHAGOGASTRODUODENOSCOPY: SHX5428

## 2019-05-30 LAB — BASIC METABOLIC PANEL
Anion gap: 9 (ref 5–15)
BUN: 10 mg/dL (ref 6–20)
CO2: 22 mmol/L (ref 22–32)
Calcium: 8 mg/dL — ABNORMAL LOW (ref 8.9–10.3)
Chloride: 105 mmol/L (ref 98–111)
Creatinine, Ser: 0.79 mg/dL (ref 0.44–1.00)
GFR calc Af Amer: >60 mL/min (ref >60)
GFR calc non Af Amer: >60 mL/min (ref >60)
Glucose, Bld: 118 mg/dL — ABNORMAL HIGH (ref 70–99)
Potassium: 3.8 mmol/L (ref 3.5–5.1)
Sodium: 136 mmol/L (ref 135–145)

## 2019-05-30 LAB — CBC
HCT: 31.9 % — ABNORMAL LOW (ref 36.0–46.0)
Hemoglobin: 10.1 g/dL — ABNORMAL LOW (ref 12.0–15.0)
MCH: 31.9 pg (ref 26.0–34.0)
MCHC: 31.7 g/dL (ref 30.0–36.0)
MCV: 100.6 fL — ABNORMAL HIGH (ref 80.0–100.0)
Platelets: 331 10*3/uL (ref 150–400)
RBC: 3.17 MIL/uL — ABNORMAL LOW (ref 3.87–5.11)
RDW: 13.2 % (ref 11.5–15.5)
WBC: 8.1 10*3/uL (ref 4.0–10.5)
nRBC: 0 % (ref 0.0–0.2)

## 2019-05-30 LAB — FERRITIN: Ferritin: 15 ng/mL (ref 11–307)

## 2019-05-30 LAB — VITAMIN B12: Vitamin B-12: 374 pg/mL (ref 180–914)

## 2019-05-30 LAB — IRON: Iron: 20 ug/dL — ABNORMAL LOW (ref 28–170)

## 2019-05-30 SURGERY — EGD (ESOPHAGOGASTRODUODENOSCOPY)
Anesthesia: Moderate Sedation

## 2019-05-30 MED ORDER — SODIUM CHLORIDE 0.9 % IV SOLN: INTRAVENOUS | Status: DC

## 2019-05-30 MED ORDER — MEPERIDINE HCL 50 MG/ML IJ SOLN
INTRAMUSCULAR | Status: AC
  Filled 2019-05-30: qty 1

## 2019-05-30 MED ORDER — MIDAZOLAM HCL 5 MG/5ML IJ SOLN
INTRAMUSCULAR | Status: AC
  Filled 2019-05-30: qty 10

## 2019-05-30 MED ORDER — LIDOCAINE VISCOUS HCL 2 % MT SOLN
OROMUCOSAL | Status: AC
  Filled 2019-05-30: qty 15

## 2019-05-30 MED ORDER — MIDAZOLAM HCL 5 MG/5ML IJ SOLN
INTRAMUSCULAR | Status: DC | PRN
  Administered 2019-05-30 (×5): 2 mg via INTRAVENOUS

## 2019-05-30 MED ORDER — STERILE WATER FOR IRRIGATION IR SOLN
Status: DC | PRN
  Administered 2019-05-30: 1.5 mL

## 2019-05-30 MED ORDER — MEPERIDINE HCL 50 MG/ML IJ SOLN
INTRAMUSCULAR | Status: DC | PRN
  Administered 2019-05-30 (×4): 25 mg via INTRAVENOUS

## 2019-05-30 NOTE — Interval H&P Note (Signed)
Patient reports passing normal stools.  She has not had abdominal pain nausea or vomiting. She has no new complaints.  She [facility] not [facility]. ABC and metabolic 7 are pending from this afternoon. History and Physical Interval Note:  05/30/2019 12:40 PM  Beth Sanford  has presented today for surgery, with the diagnosis of melena.  The various methods of treatment have been discussed with the patient and family. After consideration of risks, benefits and other options for treatment, the patient has consented to  Procedure(s) with comments: ESOPHAGOGASTRODUODENOSCOPY (EGD) (N/A) - 150 as a surgical intervention.  The patient's history has been reviewed, patient examined, no change in status, stable for surgery.  I have reviewed the patient's chart and labs.  Questions were answered to the patient's satisfaction.     [employer]

## 2019-05-30 NOTE — Discharge Instructions (Signed)
Do not take Goody powder or ibuprofen or keep use to minimum. Resume usual medications and diet. Physician will Riederer with results of blood test and biopsy. No driving for 24 hours.   Upper Endoscopy, Adult, Care After This sheet gives you information about how to care for yourself after your procedure. Your health care provider may also give you more specific instructions. If you have problems or questions, contact your health care provider. What can I expect after the procedure? After the procedure, it is common to have:  A sore throat.  Mild stomach pain or discomfort.  Bloating.  Nausea. Follow these instructions at home:   Follow instructions from your health care provider about what to eat or drink after your procedure.  Return to your normal activities as told by your health care provider. Ask your health care provider what activities are safe for you.  Take over-the-counter and prescription medicines only as told by your health care provider.  Do not drive for 24 hours if you were given a sedative during your procedure.  Keep all follow-up visits as told by your health care provider. This is important. Contact a health care provider if you have:  A sore throat that lasts longer than one day.  Trouble swallowing. Get help right away if:  You vomit blood or your vomit looks like coffee grounds.  You have: ? A fever. ? Bloody, black, or tarry stools. ? A severe sore throat or you cannot swallow. ? Difficulty breathing. ? Severe pain in your chest or abdomen. Summary  After the procedure, it is common to have a sore throat, mild stomach discomfort, bloating, and nausea.  Do not drive for 24 hours if you were given a sedative during the procedure.  Follow instructions from your health care provider about what to eat or drink after your procedure.  Return to your normal activities as told by your health care provider. This information is not intended to replace  advice given to you by your health care provider. Make sure you discuss any questions you have with your health care provider. Document Revised: 06/14/2017 Document Reviewed: 05/23/2017 Elsevier Patient Education  2020 Elsevier Inc.  Hiatal Hernia  A hiatal hernia occurs when part of the stomach slides above the muscle that separates the abdomen from the chest (diaphragm). A person can be born with a hiatal hernia (congenital), or it may develop over time. In almost all cases of hiatal hernia, only the top part of the stomach pushes through the diaphragm. Many people have a hiatal hernia with no symptoms. The larger the hernia, the more likely it is that you will have symptoms. In some cases, a hiatal hernia allows stomach acid to flow back into the tube that carries food from your mouth to your stomach (esophagus). This may cause heartburn symptoms. Severe heartburn symptoms may mean that you have developed a condition called gastroesophageal reflux disease (GERD). What are the causes? This condition is caused by a weakness in the opening (hiatus) where the esophagus passes through the diaphragm to attach to the upper part of the stomach. A person may be born with a weakness in the hiatus, or a weakness can develop over time. What increases the risk? This condition is more likely to develop in:  Older people. Age is a major risk factor for a hiatal hernia, especially if you are over the age of 42.  Pregnant women.  People who are overweight.  People who have frequent constipation. What are  the signs or symptoms? Symptoms of this condition usually develop in the form of GERD symptoms. Symptoms include:  Heartburn.  Belching.  Indigestion.  Trouble swallowing.  Coughing or wheezing.  Sore throat.  Hoarseness.  Chest pain.  Nausea and vomiting. How is this diagnosed? This condition may be diagnosed during testing for GERD. Tests that may be done include:  X-rays of your  stomach or chest.  An upper gastrointestinal (GI) series. This is an X-ray exam of your GI tract that is taken after you swallow a chalky liquid that shows up clearly on the X-ray.  Endoscopy. This is a procedure to look into your stomach using a thin, flexible tube that has a tiny camera and light on the end of it. How is this treated? This condition may be treated by:  Dietary and lifestyle changes to help reduce GERD symptoms.  Medicines. These may include: ? Over-the-counter antacids. ? Medicines that make your stomach empty more quickly. ? Medicines that block the production of stomach acid (H2 blockers). ? Stronger medicines to reduce stomach acid (proton pump inhibitors).  Surgery to repair the hernia, if other treatments are not helping. If you have no symptoms, you may not need treatment. Follow these instructions at home: Lifestyle and activity  Do not use any products that contain nicotine or tobacco, such as cigarettes and e-cigarettes. If you need help quitting, ask your health care provider.  Try to achieve and maintain a healthy body weight.  Avoid putting pressure on your abdomen. Anything that puts pressure on your abdomen increases the amount of acid that may be pushed up into your esophagus. ? Avoid bending over, especially after eating. ? Raise the head of your bed by putting blocks under the legs. This keeps your head and esophagus higher than your stomach. ? Do not wear tight clothing around your chest or stomach. ? Try not to strain when having a bowel movement, when urinating, or when lifting heavy objects. Eating and drinking  Avoid foods that can worsen GERD symptoms. These may include: ? Fatty foods, like fried foods. ? Citrus fruits, like oranges or lemon. ? Other foods and drinks that contain acid, like orange juice or tomatoes. ? Spicy food. ? Chocolate.  Eat frequent small meals instead of three large meals a day. This helps prevent your stomach  from getting too full. ? Eat slowly. ? Do not lie down right after eating. ? Do not eat 1-2 hours before bed.  Do not drink beverages with caffeine. These include cola, coffee, cocoa, and tea.  Do not drink alcohol. General instructions  Take over-the-counter and prescription medicines only as told by your health care provider.  Keep all follow-up visits as told by your health care provider. This is important. Contact a health care provider if:  Your symptoms are not controlled with medicines or lifestyle changes.  You are having trouble swallowing.  You have coughing or wheezing that will not go away. Get help right away if:  Your pain is getting worse.  Your pain spreads to your arms, neck, jaw, teeth, or back.  You have shortness of breath.  You sweat for no reason.  You feel sick to your stomach (nauseous) or you vomit.  You vomit blood.  You have bright red blood in your stools.  You have black, tarry stools. This information is not intended to replace advice given to you by your health care provider. Make sure you discuss any questions you have with your health  care provider. Document Revised: 12/03/2016 Document Reviewed: 07/26/2016 Elsevier Patient Education  2020 [employer].

## 2019-05-30 NOTE — Op Note (Signed)
[facility] Patient Name: Beth Sanford Procedure Date: 05/30/2019 12:41 PM MRN: [id number] Date of Birth: 03/16/1970 Attending MD: Lionel DecemberNajeeb Christyl Osentoski , MD CSN: [id number] Age: 49 Admit Type: Outpatient Procedure:                Upper GI endoscopy Indications:              Melena Providers:                Lionel DecemberNajeeb Laurine Kuyper, MD, Criselda PeachesLurae B. Mathis FareAlbert RN, RN, Edythe ClarityKelly                            C[facility], Technician Referring MD:              Medicines:                Lidocaine spray, Meperidine 100 mg IV, Midazolam 10                            mg IV Complications:            No immediate complications. Estimated Blood Loss:     Estimated blood loss was minimal. Procedure:                Pre-Anesthesia Assessment:                           - Prior to the procedure, a History and Physical                            was performed, and patient medications and                            allergies were reviewed. The patient's tolerance of                            previous anesthesia was also reviewed. The risks                            and benefits of the procedure and the sedation                            options and risks were discussed with the patient.                            All questions were answered, and informed consent                            was obtained. Prior Anticoagulants: The patient has                            taken no previous anticoagulant or antiplatelet                            agents except for aspirin. ASA Grade Assessment: II                            -  A patient with mild systemic disease. After                            reviewing the risks and benefits, the patient was                            deemed in satisfactory condition to undergo the                            procedure.                           After obtaining informed consent, the endoscope was                            passed under direct vision. Throughout the                            procedure,  the patient's blood pressure, pulse, and                            oxygen saturations were monitored continuously. The                            [id number]-H190 ([id number]) scope was introduced through the                            mouth, and advanced to the second part of duodenum.                            The upper GI endoscopy was accomplished without                            difficulty. The patient tolerated the procedure                            well. Scope In: 12:56:59 PM Scope Out: 1:07:23 PM Total Procedure Duration: 0 hours 10 minutes 24 seconds  Findings:      The hypopharynx was normal.      The proximal esophagus and mid esophagus were normal.      LA Grade B (one or more mucosal breaks greater than 5 mm, not extending       between the tops of two mucosal folds) esophagitis with no bleeding was       found 30 to 33 cm from the incisors.      The Z-line was irregular and was found 33 cm from the incisors. Biopsies       were taken with a cold forceps for histology. The pathology specimen was       placed into Bottle Number 1.      The entire examined stomach was normal.      Multiple erosions without bleeding were found in the duodenal bulb.      The second portion of the duodenum was normal. Impression:               - Normal hypopharynx.                           -  Normal proximal esophagus and mid esophagus.                           - LA Grade B esophagitis with no bleeding.                           - Z-line irregular, 33 cm from the incisors.                            Biopsied.                           - Normal stomach.                           - Multiple duodenal erosions without bleeding.                           - Normal second portion of the duodenum. Moderate Sedation:      Moderate (conscious) sedation was administered by the endoscopy nurse       and supervised by the endoscopist. The following parameters were       monitored: oxygen saturation, heart  rate, blood pressure, CO2       capnography and response to care. Total physician intraservice time was       21 minutes. Recommendation:           - Patient has a contact number available for                            emergencies. The signs and symptoms of potential                            delayed complications were discussed with the                            patient. Return to normal activities tomorrow.                            Written discharge instructions were provided to the                            patient.                           - Resume previous diet today.                           - Continue present medications.                           - No aspirin, ibuprofen, naproxen, or other                            non-steroidal anti-inflammatory drugs.                           - Await pathology  results.                           - H. Pylori serology, serum iron, TIBC, ferritin                            and B12 level to be checked today. Procedure Code(s):        --- Professional ---                           [phone], Esophagogastroduodenoscopy, flexible,                            transoral; with biopsy, single or multiple                           G0500, Moderate sedation services provided by the                            same physician or other qualified health care                            professional performing a gastrointestinal                            endoscopic service that sedation supports,                            requiring the presence of an independent trained                            observer to assist in the monitoring of the                            patient's level of consciousness and physiological                            status; initial 15 minutes of intra-service time;                            patient age 8 years or older (additional time may                            be reported with 13086, as appropriate) Diagnosis Code(s):         --- Professional ---                           K20.90, Esophagitis, unspecified without bleeding                           K22.8, Other specified diseases of esophagus                           K26.9, Duodenal ulcer, unspecified as acute or  chronic, without hemorrhage or perforation                           K92.1, Melena (includes Hematochezia) CPT copyright 2019 American Medical Association. All rights reserved. The codes documented in this report are preliminary and upon coder review may  be revised to meet current compliance requirements. Lionel December, MD Lionel December, MD 05/30/2019 1:23:56 PM This report has been signed electronically. Number of Addenda: 0

## 2019-05-31 LAB — SURGICAL PATHOLOGY

## 2019-06-01 LAB — H. PYLORI ANTIBODY, IGG: H Pylori IgG: 5.73 Index Value — ABNORMAL HIGH (ref 0.00–0.79)

## 2019-06-08 ENCOUNTER — Other Ambulatory Visit (INDEPENDENT_AMBULATORY_CARE_PROVIDER_SITE_OTHER): Payer: Self-pay | Admitting: Internal Medicine

## 2019-06-08 MED ORDER — METRONIDAZOLE 500 MG PO TABS: 500.0000 mg | ORAL_TABLET | Freq: Three times a day (TID) | ORAL | 0 refills | Status: DC

## 2019-06-08 MED ORDER — TETRACYCLINE HCL 500 MG PO CAPS: 500.0000 mg | ORAL_CAPSULE | Freq: Four times a day (QID) | ORAL | 0 refills | Status: DC

## 2019-06-11 ENCOUNTER — Encounter (INDEPENDENT_AMBULATORY_CARE_PROVIDER_SITE_OTHER): Payer: Self-pay | Admitting: *Deleted

## 2019-06-11 ENCOUNTER — Other Ambulatory Visit (INDEPENDENT_AMBULATORY_CARE_PROVIDER_SITE_OTHER): Payer: Self-pay | Admitting: *Deleted

## 2019-06-11 DIAGNOSIS — E611 Iron deficiency: Secondary | ICD-10-CM

## 2019-06-11 DIAGNOSIS — A048 Other specified bacterial intestinal infections: Secondary | ICD-10-CM

## 2019-08-27 ENCOUNTER — Other Ambulatory Visit: Payer: Self-pay

## 2019-08-27 ENCOUNTER — Other Ambulatory Visit: Payer: Self-pay | Admitting: Student

## 2019-08-27 ENCOUNTER — Ambulatory Visit: Payer: Self-pay | Admitting: Physician Assistant

## 2019-08-27 ENCOUNTER — Encounter: Payer: Self-pay | Admitting: Physician Assistant

## 2019-08-27 VITALS — BP 150/110 | HR 68 | Temp 97.7°F | Ht 62.5 in | Wt 163.7 lb

## 2019-08-27 DIAGNOSIS — R7303 Prediabetes: Secondary | ICD-10-CM

## 2019-08-27 DIAGNOSIS — I10 Essential (primary) hypertension: Secondary | ICD-10-CM

## 2019-08-27 DIAGNOSIS — R079 Chest pain, unspecified: Secondary | ICD-10-CM

## 2019-08-27 DIAGNOSIS — E785 Hyperlipidemia, unspecified: Secondary | ICD-10-CM

## 2019-08-27 DIAGNOSIS — Z91199 Patient's noncompliance with other medical treatment and regimen due to unspecified reason: Secondary | ICD-10-CM

## 2019-08-27 DIAGNOSIS — D649 Anemia, unspecified: Secondary | ICD-10-CM

## 2019-08-27 MED ORDER — OMEPRAZOLE 40 MG PO CPDR
40.0000 mg | DELAYED_RELEASE_CAPSULE | Freq: Every day | ORAL | 0 refills | Status: DC
Start: 2019-08-27 — End: 2019-09-11

## 2019-08-27 MED ORDER — LOSARTAN POTASSIUM 100 MG PO TABS: 100.0000 mg | ORAL_TABLET | Freq: Every day | ORAL | 0 refills | Status: DC

## 2019-08-27 NOTE — Progress Notes (Signed)
BP (!) 150/110    Pulse 68    Temp 97.7 F (36.5 C)    Ht 5' 2.5" (1.588 m)    Wt 163 lb 11.2 oz (74.3 kg)    LMP 03/05/2018    SpO2 98%    BMI 29.46 kg/m    Subjective:    Patient ID: Beth Sanford, female    DOB: 08-29-1970, 49 y.o.   MRN: [id number]  HPI: Beth Sanford is a 49 y.o. female presenting on 08/27/2019 for Follow-up   HPI    Pt had a negative covid 19 screening questionnaire.    Pt is a 48yoF who has HTN and was scheduled to come in for follow up in January 2021 but is just now returning.  Pt is working at [employer]  Pt says she has been having CP.  She says she's been having a lot lately.   Pt has had CP chronically in the past and was followed by cardiology for it.  They had her on ranexa at one point.  She was Last seen by cardiology 09/26/15.  She is not seeing anyone for MH  She had EGD in May 2021 that showed esophagitis.  She is denied for disabiity again.  She says she's trying to get disability "for everything that's wrong with me".    Pt not yet vaccinated for covid.  Pt says she has [employer] financial assistance           Relevant past medical, surgical, family and social history reviewed and updated as indicated. Interim medical history since our last visit reviewed. Allergies and medications reviewed and updated.  CURRENT MEDS: IBU Goody powders  Review of Systems  Per HPI unless specifically indicated above     Objective:    BP (!) 150/110    Pulse 68    Temp 97.7 F (36.5 C)    Ht 5' 2.5" (1.588 m)    Wt 163 lb 11.2 oz (74.3 kg)    LMP 03/05/2018    SpO2 98%    BMI 29.46 kg/m   Wt Readings from Last 3 Encounters:  08/27/19 163 lb 11.2 oz (74.3 kg)  05/30/19 165 lb (74.8 kg)  05/21/19 159 lb 11.2 oz (72.4 kg)    Physical Exam Vitals reviewed.  Constitutional:      General: She is not in acute distress.    Appearance: She is well-developed. She is not ill-appearing.  HENT:     Head: Normocephalic and atraumatic.   Cardiovascular:     Rate and Rhythm: Normal rate and regular rhythm.  Pulmonary:     Effort: Pulmonary effort is normal.     Breath sounds: Normal breath sounds.  Abdominal:     General: Bowel sounds are normal.     Palpations: Abdomen is soft. There is no mass.     Tenderness: There is no abdominal tenderness.  Musculoskeletal:     Cervical back: Neck supple.     Right lower leg: No edema.     Left lower leg: No edema.  Lymphadenopathy:     Cervical: No cervical adenopathy.  Skin:    General: Skin is warm and dry.  Neurological:     Mental Status: She is alert and oriented to person, place, and time.  Psychiatric:        Behavior: Behavior normal.            Assessment & Plan:    Encounter Diagnoses  Name Primary?  Essential hypertension Yes   Hyperlipidemia, unspecified hyperlipidemia type    Personal history of noncompliance with medical treatment, presenting hazards to health    Anemia, unspecified type    Prediabetes    Chronic chest pain       -Update labs    -Restart losartan -pt to Get on omeprazole.  It is uncertain whether she ever took it  -Refer back to cardiology -encouraged pt to Get vaccinated for covid -encouraged Smoking cessation -pt to follow up 4 weeks.  She is to contact office sooner prn

## 2019-08-29 ENCOUNTER — Encounter: Payer: Self-pay | Admitting: Student

## 2019-09-10 ENCOUNTER — Encounter (HOSPITAL_COMMUNITY): Payer: Self-pay | Admitting: Emergency Medicine

## 2019-09-10 ENCOUNTER — Other Ambulatory Visit: Payer: Self-pay

## 2019-09-10 ENCOUNTER — Observation Stay (HOSPITAL_COMMUNITY)
Admission: EM | Admit: 2019-09-10 | Discharge: 2019-09-11 | Disposition: A | Discharge status: Home / Self Care | Payer: Self-pay | Attending: Emergency Medicine | Admitting: Emergency Medicine

## 2019-09-10 ENCOUNTER — Emergency Department (HOSPITAL_COMMUNITY): Payer: Self-pay

## 2019-09-10 DIAGNOSIS — F329 Major depressive disorder, single episode, unspecified: Secondary | ICD-10-CM | POA: Insufficient documentation

## 2019-09-10 DIAGNOSIS — I509 Heart failure, unspecified: Secondary | ICD-10-CM | POA: Insufficient documentation

## 2019-09-10 DIAGNOSIS — Z20822 Contact with and (suspected) exposure to covid-19: Secondary | ICD-10-CM | POA: Insufficient documentation

## 2019-09-10 DIAGNOSIS — E7800 Pure hypercholesterolemia, unspecified: Secondary | ICD-10-CM | POA: Insufficient documentation

## 2019-09-10 DIAGNOSIS — I10 Essential (primary) hypertension: Secondary | ICD-10-CM | POA: Insufficient documentation

## 2019-09-10 DIAGNOSIS — I110 Hypertensive heart disease with heart failure: Secondary | ICD-10-CM | POA: Insufficient documentation

## 2019-09-10 DIAGNOSIS — Z6828 Body mass index (BMI) 28.0-28.9, adult: Secondary | ICD-10-CM | POA: Insufficient documentation

## 2019-09-10 DIAGNOSIS — G4733 Obstructive sleep apnea (adult) (pediatric): Secondary | ICD-10-CM | POA: Insufficient documentation

## 2019-09-10 DIAGNOSIS — R079 Chest pain, unspecified: Secondary | ICD-10-CM | POA: Insufficient documentation

## 2019-09-10 DIAGNOSIS — K219 Gastro-esophageal reflux disease without esophagitis: Secondary | ICD-10-CM | POA: Insufficient documentation

## 2019-09-10 DIAGNOSIS — E669 Obesity, unspecified: Secondary | ICD-10-CM | POA: Insufficient documentation

## 2019-09-10 DIAGNOSIS — Z79899 Other long term (current) drug therapy: Secondary | ICD-10-CM | POA: Insufficient documentation

## 2019-09-10 DIAGNOSIS — I160 Hypertensive urgency: Principal | ICD-10-CM | POA: Insufficient documentation

## 2019-09-10 LAB — BASIC METABOLIC PANEL
Anion gap: 11 (ref 5–15)
BUN: 8 mg/dL (ref 6–20)
CO2: 21 mmol/L — ABNORMAL LOW (ref 22–32)
Calcium: 8.7 mg/dL — ABNORMAL LOW (ref 8.9–10.3)
Chloride: 105 mmol/L (ref 98–111)
Creatinine, Ser: 0.75 mg/dL (ref 0.44–1.00)
GFR calc Af Amer: >60 mL/min (ref >60)
GFR calc non Af Amer: >60 mL/min (ref >60)
Glucose, Bld: 126 mg/dL — ABNORMAL HIGH (ref 70–99)
Potassium: 3.6 mmol/L (ref 3.5–5.1)
Sodium: 137 mmol/L (ref 135–145)

## 2019-09-10 LAB — BRAIN NATRIURETIC PEPTIDE: B Natriuretic Peptide: 92 pg/mL (ref 0.0–100.0)

## 2019-09-10 LAB — CBC WITH DIFFERENTIAL/PLATELET
Abs Immature Granulocytes: 0.04 10*3/uL (ref 0.00–0.07)
Basophils Absolute: 0.1 10*3/uL (ref 0.0–0.1)
Basophils Relative: 1 %
Eosinophils Absolute: 0.1 10*3/uL (ref 0.0–0.5)
Eosinophils Relative: 1 %
HCT: 48.9 % — ABNORMAL HIGH (ref 36.0–46.0)
Hemoglobin: 14.9 g/dL (ref 12.0–15.0)
Immature Granulocytes: 0 %
Lymphocytes Relative: 27 %
Lymphs Abs: 2.9 10*3/uL (ref 0.7–4.0)
MCH: 27.2 pg (ref 26.0–34.0)
MCHC: 30.5 g/dL (ref 30.0–36.0)
MCV: 89.4 fL (ref 80.0–100.0)
Monocytes Absolute: 0.8 10*3/uL (ref 0.1–1.0)
Monocytes Relative: 7 %
Neutro Abs: 7.1 10*3/uL (ref 1.7–7.7)
Neutrophils Relative %: 64 %
Platelets: 321 10*3/uL (ref 150–400)
RBC: 5.47 MIL/uL — ABNORMAL HIGH (ref 3.87–5.11)
RDW: 17.7 % — ABNORMAL HIGH (ref 11.5–15.5)
WBC: 11 10*3/uL — ABNORMAL HIGH (ref 4.0–10.5)
nRBC: 0 % (ref 0.0–0.2)

## 2019-09-10 LAB — TROPONIN I (HIGH SENSITIVITY)
Troponin I (High Sensitivity): 7 ng/L (ref <18)
Troponin I (High Sensitivity): 8 ng/L (ref <18)

## 2019-09-10 LAB — POC URINE PREG, ED: Preg Test, Ur: NEGATIVE

## 2019-09-10 LAB — SARS CORONAVIRUS 2 BY RT PCR (HOSPITAL ORDER, PERFORMED IN ~~LOC~~ HOSPITAL LAB): SARS Coronavirus 2: NEGATIVE

## 2019-09-10 MED ORDER — ATORVASTATIN CALCIUM 40 MG PO TABS: 40.0000 mg | ORAL_TABLET | Freq: Every day | ORAL | Status: DC

## 2019-09-10 MED ORDER — ACETAMINOPHEN 650 MG RE SUPP: 650.0000 mg | Freq: Four times a day (QID) | RECTAL | Status: DC | PRN

## 2019-09-10 MED ORDER — CARVEDILOL 3.125 MG PO TABS
6.2500 mg | ORAL_TABLET | Freq: Two times a day (BID) | ORAL | Status: DC
  Administered 2019-09-10 – 2019-09-11 (×2): 6.25 mg via ORAL
  Filled 2019-09-10 (×2): qty 2

## 2019-09-10 MED ORDER — HYDRALAZINE HCL 20 MG/ML IJ SOLN
10.0000 mg | Freq: Once | INTRAMUSCULAR | Status: AC
  Administered 2019-09-10: 10 mg via INTRAVENOUS
  Filled 2019-09-10: qty 1

## 2019-09-10 MED ORDER — LOSARTAN POTASSIUM 50 MG PO TABS
50.0000 mg | ORAL_TABLET | Freq: Every day | ORAL | Status: DC
  Administered 2019-09-10: 50 mg via ORAL
  Filled 2019-09-10: qty 2

## 2019-09-10 MED ORDER — ENOXAPARIN SODIUM 40 MG/0.4ML ~~LOC~~ SOLN
40.0000 mg | SUBCUTANEOUS | Status: DC
  Filled 2019-09-10: qty 0.4

## 2019-09-10 MED ORDER — HYDRALAZINE HCL 20 MG/ML IJ SOLN
10.0000 mg | INTRAMUSCULAR | Status: DC | PRN
  Administered 2019-09-10: 10 mg via INTRAVENOUS
  Filled 2019-09-10: qty 1

## 2019-09-10 MED ORDER — POTASSIUM CHLORIDE CRYS ER 20 MEQ PO TBCR
40.0000 meq | EXTENDED_RELEASE_TABLET | Freq: Once | ORAL | Status: AC
  Administered 2019-09-11: 40 meq via ORAL
  Filled 2019-09-10: qty 2

## 2019-09-10 MED ORDER — NITROGLYCERIN 0.4 MG SL SUBL: 0.4000 mg | SUBLINGUAL_TABLET | SUBLINGUAL | Status: DC | PRN

## 2019-09-10 MED ORDER — POLYETHYLENE GLYCOL 3350 17 G PO PACK: 17.0000 g | PACK | Freq: Every day | ORAL | Status: DC | PRN

## 2019-09-10 MED ORDER — ONDANSETRON HCL 4 MG PO TABS: 4.0000 mg | ORAL_TABLET | Freq: Four times a day (QID) | ORAL | Status: DC | PRN

## 2019-09-10 MED ORDER — ONDANSETRON HCL 4 MG/2ML IJ SOLN: 4.0000 mg | Freq: Four times a day (QID) | INTRAMUSCULAR | Status: DC | PRN

## 2019-09-10 MED ORDER — ACETAMINOPHEN 325 MG PO TABS
650.0000 mg | ORAL_TABLET | Freq: Four times a day (QID) | ORAL | Status: DC | PRN
  Administered 2019-09-10: 650 mg via ORAL
  Filled 2019-09-10: qty 2

## 2019-09-10 MED ORDER — TRAZODONE HCL 50 MG PO TABS
50.0000 mg | ORAL_TABLET | Freq: Every day | ORAL | Status: DC
  Administered 2019-09-10: 50 mg via ORAL
  Filled 2019-09-10: qty 1

## 2019-09-10 NOTE — ED Provider Notes (Signed)
[facility] EMERGENCY DEPARTMENT Provider Note   CSN: [id number] Arrival date & time: 09/10/19  1412     History Chief Complaint  Patient presents with  . Chest Pain    Beth Sanford is a 49 y.o. female with past medical history of hypertension, high cholesterol, OSA, CHF, presents today for evaluation of chest pain.  She reports that over the past week she has had intermittent chest pain.  She states that it comes and goes.  When it is present last for about 10 minutes.  She thinks that it goes away when she eats Tums or Maalox.  She reports that she has not been taking any of her medications including blood pressure medicines for over a year.  She states the pain is in the middle of her chest, does not radiate or move.  She denies any cough or shortness of breath.  No fevers or known Covid contacts.  She is not vaccinated against Covid.  She denies any nausea or vomiting.  She states that the pain is happening about 4-5 times a day, and has woken her up from her sleep and started at rest before.    She used to be followed by cardiology for chest pains however has not seen them in about 5 years.  When asked about caffeine use she states that she drank MTN dew this morning and works at a [employer] place so eats a lot of salt.  She denies any leg swelling.  She does not know what medications she used to take.  She reports that she smokes about 1 pack of cigarettes a week.  Her father died from heart related issues in his mid 13s.  HPI     Past Medical History:  Diagnosis Date  . Anxiety   . CHF (congestive heart failure) (HCC)   . Depression   . High cholesterol   . Hypertension   . OSA (obstructive sleep apnea) 09/13/2014    Patient Active Problem List   Diagnosis Date Noted  . Hypertensive urgency 09/10/2019  . Melena 05/21/2019  . Hypertension 05/21/2019  . Abnormal LFTs 09/16/2015  . Tobacco abuse 12/18/2014  . OSA (obstructive sleep apnea) 09/13/2014  . Major depression  06/08/2013  . Chest pain 11/01/2012  . Essential hypertension, malignant 11/01/2012  . Hyperlipidemia 11/01/2012    Past Surgical History:  Procedure Laterality Date  . BIOPSY  05/30/2019   Procedure: BIOPSY;  Surgeon: Malissa Hippo, MD;  Location: [facility];  Service: Endoscopy;;  esophagus  . CHOLECYSTECTOMY    . ELBOW SURGERY  left  . ESOPHAGOGASTRODUODENOSCOPY N/A 05/30/2019   Procedure: ESOPHAGOGASTRODUODENOSCOPY (EGD);  Surgeon: Malissa Hippo, MD;  Location: [facility];  Service: Endoscopy;  Laterality: N/A;  150     OB History    Gravida  3   Para  1   Term      Preterm      AB  2   Living        SAB      TAB      Ectopic      Multiple      Live Births              Family History  Problem Relation Age of Onset  . Alcohol abuse Father   . Heart failure Father        deceased at age 18  . Hypertension Brother   . Bipolar disorder Brother   . Emphysema Maternal  Grandfather   . Colon cancer Neg Hx     Social History   Tobacco Use  . Smoking status: Current Some Day Smoker    Packs/day: 0.00    Years: 33.00    Pack years: 0.00    Types: Cigarettes    Start date: 01/05/1984  . Smokeless tobacco: Never Used  . Tobacco comment: 2-3 cig week  Vaping Use  . Vaping Use: Never used  Substance Use Topics  . Alcohol use: No    Alcohol/week: 0.0 standard drinks  . Drug use: No    Home Medications Prior to Admission medications   Medication Sig Start Date End Date Taking? Authorizing Provider  alum & mag hydroxide-simeth (MAALOX ADVANCED MAX ST) 400-400-40 MG/5ML suspension Take 15 mLs by mouth every 6 (six) hours as needed for indigestion.   Yes [provider]  calcium carbonate (TUMS - DOSED IN MG ELEMENTAL CALCIUM) 500 MG chewable tablet Chew 1 tablet by mouth daily. Five times daily or more in the past week   Yes [provider]  losartan (COZAAR) 100 MG tablet Take 1 tablet (100 mg total) by mouth daily. Patient  not taking: Reported on 09/10/2019 08/27/19   Jacquelin HawkingMcElroy, Shannon, PA-C  omeprazole (PRILOSEC) 40 MG capsule Take 1 capsule (40 mg total) by mouth daily. Patient not taking: Reported on 09/10/2019 08/27/19   Jacquelin HawkingMcElroy, Shannon, PA-C    Allergies    Codeine and Tramadol  Review of Systems   Review of Systems  Constitutional: Negative for chills and fever.  Eyes: Negative for visual disturbance.  Respiratory: Negative for cough and shortness of breath.   Cardiovascular: Positive for chest pain. Negative for palpitations and leg swelling.  Gastrointestinal: Negative for abdominal pain, nausea and vomiting.  Musculoskeletal: Negative for back pain and neck pain.  Skin: Negative for color change and rash.  Neurological: Negative for weakness and headaches.  All other systems reviewed and are negative.   Physical Exam Updated Vital Signs BP (!) 121/91   Pulse 73   Temp 98.1 F (36.7 C) (Oral)   Resp 11   Ht 5\' 2"  (1.575 m)   Wt 74.4 kg   LMP 03/05/2018   SpO2 95%   BMI 30.00 kg/m   Physical Exam Vitals and nursing note reviewed.  Constitutional:      General: She is not in acute distress.    Appearance: She is well-developed. She is not toxic-appearing or diaphoretic.  HENT:     Head: Normocephalic and atraumatic.     Right Ear: External ear normal.     Left Ear: External ear normal.     Nose: Nose normal.  Eyes:     General: No scleral icterus.       Right eye: No discharge.        Left eye: No discharge.     Conjunctiva/sclera: Conjunctivae normal.     Pupils: Pupils are equal, round, and reactive to light.  Neck:     Trachea: No tracheal deviation.  Cardiovascular:     Rate and Rhythm: Normal rate and regular rhythm.     Pulses:          Radial pulses are 2+ on the right side and 2+ on the left side.       Dorsalis pedis pulses are 2+ on the right side and 2+ on the left side.     Heart sounds: Normal heart sounds. No murmur heard.  No friction rub. No gallop.    Pulmonary:  Effort: Pulmonary effort is normal. No respiratory distress.     Breath sounds: Normal breath sounds. No stridor. No decreased breath sounds or wheezing.  Abdominal:     General: Bowel sounds are normal. There is no distension.     Palpations: Abdomen is soft.     Tenderness: There is no abdominal tenderness. There is no guarding.  Musculoskeletal:        General: No deformity.     Cervical back: Normal range of motion and neck supple.     Right lower leg: No tenderness. No edema.     Left lower leg: No tenderness. No edema.  Skin:    General: Skin is warm and dry.  Neurological:     General: No focal deficit present.     Mental Status: She is alert.     Cranial Nerves: No cranial nerve deficit.     Sensory: No sensory deficit.     Motor: No abnormal muscle tone.  Psychiatric:        Mood and Affect: Mood normal.        Behavior: Behavior normal.     ED Results / Procedures / Treatments   Labs (all labs ordered are listed, but only abnormal results are displayed) Labs Reviewed  BASIC METABOLIC PANEL - Abnormal; Notable for the following components:      Result Value   CO2 21 (*)    Glucose, Bld 126 (*)    Calcium 8.7 (*)    All other components within normal limits  CBC WITH DIFFERENTIAL/PLATELET - Abnormal; Notable for the following components:   WBC 11.0 (*)    RBC 5.47 (*)    HCT 48.9 (*)    RDW 17.7 (*)    All other components within normal limits  SARS CORONAVIRUS 2 BY RT PCR (HOSPITAL ORDER, PERFORMED IN [facility])  BRAIN NATRIURETIC PEPTIDE  URINALYSIS, ROUTINE W REFLEX MICROSCOPIC  RAPID URINE DRUG SCREEN, HOSP PERFORMED  HIV ANTIBODY (ROUTINE TESTING W REFLEX)  BASIC METABOLIC PANEL  POC URINE PREG, ED  TROPONIN I (HIGH SENSITIVITY)  TROPONIN I (HIGH SENSITIVITY)    EKG EKG Interpretation  Date/Time:  Monday September 10 2019 15:33:04 EDT Ventricular Rate:  73 PR Interval:  150 QRS Duration: 76 QT Interval:  408 QTC  Calculation: 449 R Axis:   -38 Text Interpretation: Normal sinus rhythm Left axis deviation Septal infarct , age undetermined Abnormal ECG No significant change since last tracing Confirmed by Vanetta Mulders [phone]) on 09/10/2019 3:46:23 PM   Radiology DG Chest Port 1 View  Result Date: 09/10/2019 CLINICAL DATA:  Pt c/o center chest pain x 2-3 days; denies n/v; reports she has been taking tums and maalox with some relief. COVID test ordered - results pending. Hx HTN, CHF, smoker EXAM: PORTABLE CHEST 1 VIEW COMPARISON:  10/03/2017 FINDINGS: Cardiac silhouette is normal in size. No mediastinal or hilar masses or evidence of adenopathy. Clear lungs.  No pleural effusion or pneumothorax. Skeletal structures are grossly intact. IMPRESSION: No active disease. Electronically Signed   By: Amie Portland M.D.   On: 09/10/2019 16:00    Procedures Procedures (including critical care time)  Medications Ordered in ED Medications                                hydrALAZINE (APRESOLINE) injection 10 mg (10 mg Intravenous Given 09/10/19 1636)    ED Course  I have reviewed the  triage vital signs and the nursing notes.  Pertinent labs & imaging results that were available during my care of the patient were reviewed by me and considered in my medical decision making (see chart for details).  Clinical Course as of Sep 10 2342  [facility] Sep 10, 2019  1547 Portable x-ray.    [EH]    Clinical Course User Index [EH] Norman Clay   MDM Rules/Calculators/A&P                         Patient is a 49 year old woman who presents today for evaluation of chest pain.  Her troponins are not elevated, EKG does not have evidence of clear ischemia.  Patient has multiple risk factors including tobacco use, her father dying of cardiac issues in his mid 42s, her uncontrolled hypertension.  Here in the ER she is markedly hypertensive with blood pressures as high as 234/134.  Her chest x-ray is  reassuring.  She is given IV hydralazine with improvement in her blood pressures.   CBC shows mild leukocytosis at 11, however no anemia and is otherwise unremarkable.  BMP is overall reassuring.  Her BNP is not significantly elevated.  Covid test is negative.  Based on her atypical nature of the chest pain, raising concerns for unstable angina versus hypertensive related chest pain patient will require admission for continued treatment, monitoring and evaluation. I spoke with hospitalist will see the patient for admission.  Beth Sanford was evaluated in Emergency Department on 09/10/2019 for the symptoms described in the history of present illness. She was evaluated in the context of the global COVID-19 pandemic, which necessitated consideration that the patient might be at risk for infection with the SARS-CoV-2 virus that causes COVID-19. Institutional protocols and algorithms that pertain to the evaluation of patients at risk for COVID-19 are in a state of rapid change based on information released by regulatory bodies including the CDC and federal and state organizations. These policies and algorithms were followed during the patient's care in the ED.  Note: Portions of this report may have been transcribed using voice recognition software. Every effort was made to ensure accuracy; however, inadvertent computerized transcription errors may be present   Final Clinical Impression(s) / ED Diagnoses Final diagnoses:  Chest pain    Rx / DC Orders ED Discharge Orders    None       Cristina Gong, PA-C 09/10/19 2344    Vanetta Mulders, MD 09/20/19 1053

## 2019-09-10 NOTE — ED Triage Notes (Signed)
Pt c/o center chest pain x 2-3 days; denies n/v; reports she has been taking tums and maalox with some relief

## 2019-09-10 NOTE — H&P (Signed)
History and Physical    Beth Sanford GYJ:[id number] DOB: 1970/06/29 DOA: 09/10/2019  PCP: Jacquelin Hawking, PA-C   Patient coming from: Home  I have personally briefly reviewed patient's old medical records in [facility] Link  Chief Complaint: Chest pain  HPI: Beth Sanford is a 49 y.o. female with medical history significant for hypertension, depression, OSA and anxiety. Patient presented to the ED with complaints of central, nonradiating chest pain intermittent over the past 2 weeks, episodes lasted about 10 minutes, relieved by taking Tums and resting, she has had chest pain with cough from sleep, but she also thinks that activity provokes her chest pains.  No associated difficulty breathing nausea vomiting with chest pain.  No leg swelling.  Patient reports chronic chest pain for which she is used to see a cardiologist in the past, she reports she is having the same chest pain now.  Patient has not taken her blood pressure medication losartan over the past several weeks to months, she reports that she has been under stress, as she became homeless at some point.  She denies headaches at this time.  She is supposed to use a CPAP but she has not used it in a long time.  Patient smokes cigarettes, about ~ 2 cigarettes a day.  Father had heart failure and died of a heart attack at age 50.  ED Course: Blood pressure 234/134.  O2 sats 98 200% on room air.  Heart rates 69-78.  Troponin unremarkable 7 >8.  EKG shows sinus rhythm without significant ST or T wave abnormalities.  BNP 92.  WBC 11.  Unremarkable BMP.  Portable chest x-ray without acute abnormality.  10 mg hydralazine given with blood pressure improving to 164/116.  Hospitalist to admit for chest pain and uncontrolled high blood pressure.  Review of Systems: As per HPI all other systems reviewed and negative.  Past Medical History:  Diagnosis Date  . Anxiety   . CHF (congestive heart failure) (HCC)   . Depression   . High cholesterol    . Hypertension   . OSA (obstructive sleep apnea) 09/13/2014    Past Surgical History:  Procedure Laterality Date  . BIOPSY  05/30/2019   Procedure: BIOPSY;  Surgeon: Malissa Hippo, MD;  [facility];  Service: Endoscopy;;  esophagus  . CHOLECYSTECTOMY    . ELBOW SURGERY  left  . ESOPHAGOGASTRODUODENOSCOPY N/A 05/30/2019   Procedure: ESOPHAGOGASTRODUODENOSCOPY (EGD);  Surgeon: Malissa Hippo, MD;  Location: [facility];  Service: Endoscopy;  Laterality: N/A;  150     reports that she has been smoking cigarettes. She started smoking about 35 years ago. She has been smoking about 0.00 packs per day for the past 33.00 years. She has never used smokeless tobacco. She reports that she does not drink alcohol and does not use drugs.  Allergies  Allergen Reactions  . Codeine Itching and Nausea Only  . Tramadol Nausea Only    Family History  Problem Relation Age of Onset  . Alcohol abuse Father   . Heart failure Father        deceased at age 41  . Hypertension Brother   . Bipolar disorder Brother   . Emphysema Maternal Grandfather   . Colon cancer Neg Hx     Prior to Admission medications   Medication Sig Start Date End Date Taking? Authorizing Provider  alum & mag hydroxide-simeth (MAALOX ADVANCED MAX ST) 400-400-40 MG/5ML suspension Take 15 mLs by mouth every 6 (six)  hours as needed for indigestion.   Yes [provider]  calcium carbonate (TUMS - DOSED IN MG ELEMENTAL CALCIUM) 500 MG chewable tablet Chew 1 tablet by mouth daily. Five times daily or more in the past week   Yes [provider]  losartan (COZAAR) 100 MG tablet Take 1 tablet (100 mg total) by mouth daily. Patient not taking: Reported on 09/10/2019 08/27/19   Jacquelin Hawking, PA-C  omeprazole (PRILOSEC) 40 MG capsule Take 1 capsule (40 mg total) by mouth daily. Patient not taking: Reported on 09/10/2019 08/27/19   Jacquelin Hawking, PA-C    Physical Exam: Vitals:   09/10/19 1630 09/10/19  1700 09/10/19 1808 09/10/19 1841  BP: (!) 214/114 (!) 157/108 (!) 192/115 (!) 164/116  Pulse: 71  78   Resp: (!) 21 18 (!) 25 11  Temp:      TempSrc:      SpO2: 100%  98%   Weight:      Height:        Constitutional: NAD, calm, comfortable Vitals:   09/10/19 1630 09/10/19 1700 09/10/19 1808 09/10/19 1841  BP: (!) 214/114 (!) 157/108 (!) 192/115 (!) 164/116  Pulse: 71  78   Resp: (!) 21 18 (!) 25 11  Temp:      TempSrc:      SpO2: 100%  98%   Weight:      Height:       Eyes: PERRL, lids and conjunctivae normal ENMT: Mucous membranes are moist.  Neck: normal, supple, no masses, no thyromegaly Respiratory: clear to auscultation bilaterally, no wheezing, no crackles. Normal respiratory effort. No accessory muscle use.  Cardiovascular: Regular rate and rhythm, no murmurs / rubs / gallops. No extremity edema. 2+ pedal pulses.   Abdomen: no tenderness, no masses palpated. No hepatosplenomegaly. Bowel sounds positive.  Musculoskeletal: no clubbing / cyanosis. No joint deformity upper and lower extremities. Good ROM, no contractures. Normal muscle tone.  Skin: no rashes, lesions, ulcers. No induration Neurologic: No apparent cranial nerve abnormality, moving extremities spontaneously. Psychiatric: Normal judgment and insight. Alert and oriented x 3. Normal mood.   Labs on Admission: I have personally reviewed following labs and imaging studies  CBC: Recent Labs  Lab 09/10/19 1545  WBC 11.0*  NEUTROABS 7.1  HGB 14.9  HCT 48.9*  MCV 89.4  PLT 321   Basic Metabolic Panel: Recent Labs  Lab 09/10/19 1543  NA 137  K 3.6  CL 105  CO2 21*  GLUCOSE 126*  BUN 8  CREATININE 0.75  CALCIUM 8.7*    Radiological Exams on Admission: DG Chest Port 1 View  Result Date: 09/10/2019 CLINICAL DATA:  Pt c/o center chest pain x 2-3 days; denies n/v; reports she has been taking tums and maalox with some relief. COVID test ordered - results pending. Hx HTN, CHF, smoker EXAM: PORTABLE  CHEST 1 VIEW COMPARISON:  10/03/2017 FINDINGS: Cardiac silhouette is normal in size. No mediastinal or hilar masses or evidence of adenopathy. Clear lungs.  No pleural effusion or pneumothorax. Skeletal structures are grossly intact. IMPRESSION: No active disease. Electronically Signed   By: Amie Portland M.D.   On: 09/10/2019 16:00    EKG: Independently reviewed.  Sinus rhythm, QTC 449.  No significant ST or T wave changes compared to prior EKG.  Assessment/Plan Principal Problem:   Hypertensive urgency Active Problems:   Chest pain   Essential hypertension, malignant   Major depression   OSA (obstructive sleep apnea)    Hypertensive urgency-due to noncompliance,  blood pressure 234/134, some improvement with IV 10 mg of hydralazine.  At this time no evidence of endorgan damage/dysfunction.  Patient is supposed to be on losartan 100mg .  Per 2017 notes patient was also on hydralazine and verapamil at some point, but notes also indicate patient was non-compliant with them. -Start Coreg 6.25 twice daily, titrate as tolerated -Resume losartan at 50 mg daily, titrate as tolerated -IV hydralazine for systolic greater than 170.  Chest pain-history of chronic chest pains.  Chest pain at this time may be demand 2/2 uncontrolled high blood pressure.  Troponins, EKG unremarkable.  Risk factors for heart disease-family history of premature coronary artery disease, dyslipidemia, tobacco abuse, uncontrolled hypertension.  Previously followed with cardiology.  Per last cardiology notes 2017 with Dr. 2018 - managed for malignant hypertension and chest pain.  Had echocardiogram stress testing 2014-per notes slightly thick walls, indicative of hypertensive heart disease.  I am unable to review actual results. -Will start Coreg for blood pressure control and chest pain -Obtain UDS -Start atorvastatin 40 mg daily -Obtain echocardiogram -EKG in the morning -As needed nitro -N.p.o. from midnight for now  pending clinical course  Depression -Requesting sleeping aid, will start trazodone 50 mg nightly  OSA-reports she has not used her CPAP in a while, after it broke.  DVT prophylaxis: Lovenox Code Status: Full code Family Communication: None at bedside Disposition Plan: 1 to 2 days Consults called: None Admission status: Observation, telemetry.   11-21-1993 MD [facility]  09/10/2019, 8:15 PM

## 2019-09-10 NOTE — ED Notes (Signed)
Beth Sanford, [place] made aware of pts BP. 192/115.

## 2019-09-11 ENCOUNTER — Encounter (HOSPITAL_COMMUNITY): Payer: Self-pay | Admitting: Internal Medicine

## 2019-09-11 ENCOUNTER — Observation Stay (HOSPITAL_BASED_OUTPATIENT_CLINIC_OR_DEPARTMENT_OTHER): Payer: Self-pay

## 2019-09-11 DIAGNOSIS — R079 Chest pain, unspecified: Secondary | ICD-10-CM

## 2019-09-11 DIAGNOSIS — K219 Gastro-esophageal reflux disease without esophagitis: Secondary | ICD-10-CM

## 2019-09-11 DIAGNOSIS — I10 Essential (primary) hypertension: Secondary | ICD-10-CM

## 2019-09-11 DIAGNOSIS — G4733 Obstructive sleep apnea (adult) (pediatric): Secondary | ICD-10-CM

## 2019-09-11 DIAGNOSIS — I160 Hypertensive urgency: Secondary | ICD-10-CM

## 2019-09-11 LAB — BASIC METABOLIC PANEL
Anion gap: 11 (ref 5–15)
BUN: 9 mg/dL (ref 6–20)
CO2: 20 mmol/L — ABNORMAL LOW (ref 22–32)
Calcium: 8.6 mg/dL — ABNORMAL LOW (ref 8.9–10.3)
Chloride: 108 mmol/L (ref 98–111)
Creatinine, Ser: 0.62 mg/dL (ref 0.44–1.00)
GFR calc Af Amer: >60 mL/min (ref >60)
GFR calc non Af Amer: >60 mL/min (ref >60)
Glucose, Bld: 110 mg/dL — ABNORMAL HIGH (ref 70–99)
Potassium: 4 mmol/L (ref 3.5–5.1)
Sodium: 139 mmol/L (ref 135–145)

## 2019-09-11 LAB — HIV ANTIBODY (ROUTINE TESTING W REFLEX): HIV Screen 4th Generation wRfx: NONREACTIVE

## 2019-09-11 LAB — ECHOCARDIOGRAM COMPLETE
Area-P 1/2: 2.28 cm2
Height: 62 in
S' Lateral: 1.83 cm
Weight: 2500.9 oz

## 2019-09-11 MED ORDER — CARVEDILOL 6.25 MG PO TABS
6.2500 mg | ORAL_TABLET | Freq: Two times a day (BID) | ORAL | 0 refills | Status: DC
Start: 2019-09-11 — End: 2019-11-19

## 2019-09-11 MED ORDER — PANTOPRAZOLE SODIUM 40 MG PO TBEC
40.0000 mg | DELAYED_RELEASE_TABLET | Freq: Two times a day (BID) | ORAL | Status: DC
  Administered 2019-09-11: 40 mg via ORAL
  Filled 2019-09-11: qty 1

## 2019-09-11 MED ORDER — LOSARTAN POTASSIUM 50 MG PO TABS
50.0000 mg | ORAL_TABLET | Freq: Every day | ORAL | 2 refills | Status: DC
Start: 2019-09-11 — End: 2019-10-22

## 2019-09-11 MED ORDER — ATORVASTATIN CALCIUM 40 MG PO TABS
40.0000 mg | ORAL_TABLET | Freq: Every day | ORAL | 1 refills | Status: DC
Start: 2019-09-11 — End: 2019-10-22

## 2019-09-11 MED ORDER — PANTOPRAZOLE SODIUM 40 MG PO TBEC
40.0000 mg | DELAYED_RELEASE_TABLET | Freq: Two times a day (BID) | ORAL | 1 refills | Status: DC
Start: 2019-09-11 — End: 2019-11-19

## 2019-09-11 MED ORDER — ALPRAZOLAM 0.25 MG PO TABS
0.2500 mg | ORAL_TABLET | Freq: Every evening | ORAL | Status: DC | PRN
  Administered 2019-09-11: 0.25 mg via ORAL
  Filled 2019-09-11: qty 1

## 2019-09-11 MED ORDER — NICOTINE 21 MG/24HR TD PT24: 21.0000 mg | MEDICATED_PATCH | TRANSDERMAL | 1 refills | Status: DC

## 2019-09-11 NOTE — Progress Notes (Signed)
[facility] night shift.  The patient stated that she has not being able to sleep despite using trazodone earlier.  Alprazolam 0.25 mg nightly as needed order.  Sanda Klein, MD.

## 2019-09-11 NOTE — Discharge Summary (Signed)
Physician Discharge Summary  Beth Sanford NWG:[id number] DOB: 1970/08/05 DOA: 09/10/2019  PCP: Jacquelin [facility]king, PA-C  Admit date: 09/10/2019 Discharge date: 09/11/2019  Time spent: 35 minutes  Recommendations for Outpatient Follow-up:  1. Repeat BMET to follow electrolytes and renal function 2. Reassess BP and adjust antihypertensive regimen 3. Assist patient with smoking cessation and weight loss.    Discharge Diagnoses:  Principal Problem:   Hypertensive urgency Active Problems:   Chest pain   Essential hypertension, malignant   Major depression   OSA (obstructive sleep apnea)   Gastroesophageal reflux disease   Discharge Condition: Stable and improved.  Discharged home with instruction to follow-up with PCP in 10 days.  Cardiology service will arrange follow-up as an outpatient.  CODE STATUS: Full code.  Diet recommendation: Low calorie and heart healthy diet.  Filed Weights   09/10/19 1532 09/11/19 0040  Weight: 74.4 kg 70.9 kg    History of present illness:  As per H&P written by Dr. Mariea Clonts on 09/11/19 Beth Sanford is a 49 y.o. female with medical history significant for hypertension, depression, OSA and anxiety. Patient presented to the ED with complaints of central, nonradiating chest pain intermittent over the past 2 weeks, episodes lasted about 10 minutes, relieved by taking Tums and resting, she has had chest pain with cough from sleep, but she also thinks that activity provokes her chest pains.  No associated difficulty breathing nausea vomiting with chest pain.  No leg swelling.  Patient reports chronic chest pain for which she is used to see a cardiologist in the past, she reports she is having the same chest pain now.  Patient has not taken her blood pressure medication losartan over the past several weeks to months, she reports that she has been under stress, as she became homeless at some point.  She denies headaches at this time.  She is supposed to use a CPAP  but she has not used it in a long time.  Patient smokes cigarettes, about ~ 2 cigarettes a day.  Father had heart failure and died of a heart attack at age 68.  ED Course: Blood pressure 234/134.  O2 sats 98 200% on room air.  Heart rates 69-78.  Troponin unremarkable 7 >8.  EKG shows sinus rhythm without significant ST or T wave abnormalities.  BNP 92.  WBC 11.  Unremarkable BMP.  Portable chest x-ray without acute abnormality.  10 mg hydralazine given with blood pressure improving to 164/116.  Hospitalist to admit for chest pain and uncontrolled high blood pressure.  Hospital Course:  1-hypertensive urgency  -in the setting of medication non-compliance and diet indiscretion. -improved after resumption of antihypertensive agents -patient advise to follow heart healthy diet and to be compliant with medication regimen. -Prescriptions printed and provided to patient to assist required medications -Advised to follow-up with PCP in 10 days.  2-chest pain: Noncardiac -In the setting of gastroesophageal reflux disease most likely -EKG without acute ischemic changes, negative troponin and reassuring 2D echo. -Patient instructed to be compliant with blood pressure medication management and heart healthy diet. -Has been discharged on Protonix twice a day.  3-gastroesophageal disease -As mentioned above patient discharged on Protonix twice a day -Lifestyle changes around food that can worsening condition discussed with patient.  4-class I obesity -Low calorie diet, portion control increase physical activity discussed with patient -Body mass index is 28.59 kg/m. -Patient expressed to be actively doing diet and looking to lose weight.  5-chronic diastolic heart failure in the  setting of hypertension -severe hypertrophy seen on repeat echo -patient instructed to be compliant with medication, low calorie diet and daily weight -continue good control of BP  6-OSA -was on CPAP, but reported that  machine is currently broken -recommended to contact agency that she got the machine from and to repair or exchange it, so she can be compliant with CPAP management.  7-hx of depression -no SI or hallucinations -outpatient follow up with PCP/psych recommended and initiation of antidepressant if needed encouraged.  8-tobacco abuse -Nicotine patch has been prescribed -Cessation counseling provided.    Procedures:  Echo: 1. Left ventricular ejection fraction, by estimation, is 70 to 75%. The  left ventricle has hyperdynamic function. The left ventricle has no  regional wall motion abnormalities. There is moderate asymmetric left  ventricular hypertrophy of the septal  segment. Left ventricular diastolic parameters are indeterminate.  2. Right ventricular systolic function is normal. The right ventricular  size is normal. Tricuspid regurgitation signal is inadequate for assessing  PA pressure.  3. The mitral valve is grossly normal. Trivial mitral valve  regurgitation.  4. The aortic valve is tricuspid. Aortic valve regurgitation is not  visualized.  5. The inferior vena cava is normal in size with greater than 50%  respiratory variability, suggesting right atrial pressure of 3 mmHg.   Consultations:  None formally; cardiology curbside (if echo stable, they will follow her as an outpatient)  Discharge Exam: Vitals:   09/11/19 0808 09/11/19 0948  BP: 124/80   Pulse: (!) 53 64  Resp: 18   Temp: 98.1 F (36.7 C)   SpO2: 99%     General: no chest pain, no nausea, no vomiting, no fever.  Patient would like to go home.  Blood pressure stable and controlled currently. Cardiovascular: S1 and S2, no rubs, unable to properly assess JVD with body habitus. Respiratory: Clear to auscultation bilaterally; no using accessory muscle.  Good oxygen saturation on room air. Abdomen: Obese, soft, nontender, nondistended, positive bowel sounds Extremities: No cyanosis or  clubbing.  Discharge Instructions   Discharge Instructions    Diet - low sodium heart healthy   Complete by: As directed    Discharge instructions   Complete by: As directed    Take medications as prescribed Maintain adequate hydration Arrange follow-up with PCP in 10 days Follow-up with cardiology service as an outpatient (office will contact you with appointment details) Follow heart healthy diet and low calorie diet.   Increase activity slowly   Complete by: As directed      Allergies as of 09/11/2019      Reactions   Codeine Itching, Nausea Only   Tramadol Nausea Only      Medication List    STOP taking these medications   calcium carbonate 500 MG chewable tablet Commonly known as: TUMS - dosed in mg elemental calcium   omeprazole 40 MG capsule Commonly known as: PRILOSEC     TAKE these medications   atorvastatin 40 MG tablet Commonly known as: LIPITOR Take 1 tablet (40 mg total) by mouth daily.   carvedilol 6.25 MG tablet Commonly known as: COREG Take 1 tablet (6.25 mg total) by mouth 2 (two) times daily with a meal.   losartan 50 MG tablet Commonly known as: COZAAR Take 1 tablet (50 mg total) by mouth daily. What changed:   medication strength  how much to take   Maalox Advanced Max St 400-400-40 MG/5ML suspension Generic drug: alum & mag hydroxide-simeth Take 15 mLs by mouth  every 6 (six) hours as needed for indigestion.   nicotine 21 mg/24hr patch Commonly known as: NICODERM CQ - dosed in mg/24 hours Place 1 patch (21 mg total) onto the skin daily.   pantoprazole 40 MG tablet Commonly known as: PROTONIX Take 1 tablet (40 mg total) by mouth 2 (two) times daily.      Allergies  Allergen Reactions  . Codeine Itching and Nausea Only  . Tramadol Nausea Only    Follow-up Information    Jacquelin Hawking, PA-C. Schedule an appointment as soon as possible for a visit in 10 day(s).   Specialty: Physician Assistant Contact information: [address] [place] [address] [phone]               The results of significant diagnostics from this hospitalization (including imaging, microbiology, ancillary and laboratory) are listed below for reference.    Significant Diagnostic Studies: DG Chest Port 1 View  Result Date: 09/10/2019 CLINICAL DATA:  Pt c/o center chest pain x 2-3 days; denies n/v; reports she has been taking tums and maalox with some relief. COVID test ordered - results pending. Hx HTN, CHF, smoker EXAM: PORTABLE CHEST 1 VIEW COMPARISON:  10/03/2017 FINDINGS: Cardiac silhouette is normal in size. No mediastinal or hilar masses or evidence of adenopathy. Clear lungs.  No pleural effusion or pneumothorax. Skeletal structures are grossly intact. IMPRESSION: No active disease. Electronically Signed   By: Amie Portland M.D.   On: 09/10/2019 16:00   ECHOCARDIOGRAM COMPLETE  Result Date: 09/11/2019    ECHOCARDIOGRAM REPORT   Patient Name:   Beth Sanford Date of Exam: 09/11/2019 Medical Rec #:  [id number]     Height:       62.0 in Accession #:    [id number]    Weight:       156.3 lb Date of Birth:  1970-04-02     BSA:          1.722 m Patient Age:    48 years      BP:           124/80 mmHg Patient Gender: F             HR:           53 bpm. [facility] Procedure: 2D Echo Indications:    Chest Pain 786.50 / R07.9  History:        Patient has prior history of Echocardiogram examinations, most                 recent 02/20/2015. Signs/Symptoms:Chest Pain; Risk                 Factors:Current Smoker, Dyslipidemia and Hypertension.  Sonographer:    Jeryl Columbia RDCS (AE) Referring Phys: [id number] [facility] IMPRESSIONS  1. Left ventricular ejection fraction, by estimation, is 70 to 75%. The left ventricle has hyperdynamic function. The left ventricle has no regional wall motion abnormalities. There is moderate asymmetric left ventricular hypertrophy of the septal segment. Left ventricular diastolic parameters are  indeterminate.  2. Right ventricular systolic function is normal. The right ventricular size is normal. Tricuspid regurgitation signal is inadequate for assessing PA pressure.  3. The mitral valve is grossly normal. Trivial mitral valve regurgitation.  4. The aortic valve is tricuspid. Aortic valve regurgitation is not visualized.  5. The inferior vena cava is normal in size with greater than 50% respiratory variability, suggesting right atrial pressure of 3 mmHg. FINDINGS  Left Ventricle: Left ventricular ejection fraction, by estimation, is 70 to 75%. The left ventricle has hyperdynamic function. The left ventricle has no regional wall motion abnormalities. The left ventricular internal cavity size was normal in size. There is moderate asymmetric left ventricular hypertrophy of the septal segment. Left ventricular diastolic parameters are indeterminate. Right Ventricle: The right ventricular size is normal. No increase in right ventricular wall thickness. Right ventricular systolic function is normal. Tricuspid regurgitation signal is inadequate for assessing PA pressure. The tricuspid regurgitant velocity is 1.95 m/s, and with an assumed right atrial pressure of 3 mmHg, the estimated right ventricular systolic pressure is 18.2 mmHg. Left Atrium: Left atrial size was normal in size. Right Atrium: Right atrial size was normal in size. Pericardium: There is no evidence of pericardial effusion. Mitral Valve: The mitral valve is grossly normal. Trivial mitral valve regurgitation. Tricuspid Valve: The tricuspid valve is grossly normal. Tricuspid valve regurgitation is trivial. Aortic Valve: The aortic valve is tricuspid. Aortic valve regurgitation is not visualized. Mild aortic valve annular calcification. Pulmonic Valve: The pulmonic valve was grossly normal. Pulmonic valve regurgitation is trivial. Aorta: The aortic root is normal in size and structure. Venous: The inferior vena cava is normal in size with greater  than 50% respiratory variability, suggesting right atrial pressure of 3 mmHg. IAS/Shunts: No atrial level shunt detected by color flow Doppler.  LEFT VENTRICLE PLAX 2D LVIDd:         3.64 cm  Diastology LVIDs:         1.83 cm  LV e' lateral:   6.96 cm/s LV PW:         1.19 cm  LV E/e' lateral: 11.7 LV IVS:        1.40 cm  LV e' medial:    4.90 cm/s LVOT diam:     1.90 cm  LV E/e' medial:  16.6 LVOT Area:     2.84 cm  RIGHT VENTRICLE RV S prime:     12.60 cm/s TAPSE (M-mode): 2.5 cm LEFT ATRIUM             Index       RIGHT ATRIUM           Index LA diam:        3.40 cm 1.97 cm/m  RA Area:     14.70 cm LA Vol (A2C):   41.7 ml 24.22 ml/m RA Volume:   37.80 ml  21.96 ml/m LA Vol (A4C):   43.6 ml 25.33 ml/m LA Biplane Vol: 44.5 ml 25.85 ml/m   AORTA Ao Root diam: 2.70 cm MITRAL VALVE               TRICUSPID VALVE MV Area (PHT): 2.28 cm    TR Peak grad:   15.2 mmHg MV Decel Time: 333 msec    TR Vmax:        195.00 cm/s MV E velocity: 81.40 cm/s MV A velocity: 97.30 cm/s  SHUNTS MV E/A ratio:  0.84        Systemic Diam: 1.90 cm Nona Dell MD Electronically signed by Nona Dell MD Signature Date/Time: 09/11/2019/10:01:34 AM    Final     Microbiology: Recent Results (from the past 240 hour(s))  SARS Coronavirus 2 by RT PCR (hospital order, performed in [facility] hospital lab) Nasopharyngeal Nasopharyngeal Swab     Status: None   Collection Time: 09/10/19  4:40 PM   Specimen: Nasopharyngeal Swab  Result Value Ref Range Status   SARS  Coronavirus 2 NEGATIVE NEGATIVE Final    Comment: (NOTE) SARS-CoV-2 target nucleic acids are NOT DETECTED.  The SARS-CoV-2 RNA is generally detectable in upper and lower respiratory specimens during the acute phase of infection. The lowest concentration of SARS-CoV-2 viral copies this assay can detect is 250 copies / mL. A negative result does not preclude SARS-CoV-2 infection and should not be used as the sole basis for treatment or other patient management  decisions.  A negative result may occur with improper specimen collection / handling, submission of specimen other than nasopharyngeal swab, presence of viral mutation(s) within the areas targeted by this assay, and inadequate number of viral copies (<250 copies / mL). A negative result must be combined with clinical observations, patient history, and epidemiological information.  Fact Sheet for Patients:   BoilerBrush.com.cy  Fact Sheet for Healthcare Providers: https://pope.com/  This test is not yet approved or  cleared by the [place] FDA and has been authorized for detection and/or diagnosis of SARS-CoV-2 by FDA under an Emergency Use Authorization (EUA).  This EUA will remain in effect (meaning this test can be used) for the duration of the COVID-19 declaration under Section 564(b)(1) of the Act, 21 U.S.C. section 360bbb-3(b)(1), unless the authorization is terminated or revoked sooner.  Performed at [facility], [address] [place], [place] [address]      Labs: Basic Metabolic Panel: Recent Labs  Lab 09/10/19 1543 09/11/19 0403  NA 137 139  K 3.6 4.0  CL 105 108  CO2 21* 20*  GLUCOSE 126* 110*  BUN 8 9  CREATININE 0.75 0.62  CALCIUM 8.7* 8.6*   CBC: Recent Labs  Lab 09/10/19 1545  WBC 11.0*  NEUTROABS 7.1  HGB 14.9  HCT 48.9*  MCV 89.4  PLT 321    BNP (last 3 results) Recent Labs    09/10/19 1545  BNP 92.0    Signed:  Vassie Loll MD.  [facility] 09/11/2019, 2:40 PM

## 2019-09-11 NOTE — Progress Notes (Signed)
*  PRELIMINARY RESULTS* Echocardiogram 2D Echocardiogram has been performed.  Beth Sanford 09/11/2019, 9:20 AM

## 2019-09-24 ENCOUNTER — Encounter: Payer: Self-pay | Admitting: Physician Assistant

## 2019-09-24 ENCOUNTER — Other Ambulatory Visit: Payer: Self-pay

## 2019-09-24 ENCOUNTER — Other Ambulatory Visit (HOSPITAL_COMMUNITY)
Admission: RE | Admit: 2019-09-24 | Discharge: 2019-09-24 | Disposition: A | Discharge status: Home / Self Care | Payer: Self-pay | Source: Ambulatory Visit | Attending: Physician Assistant | Admitting: Physician Assistant

## 2019-09-24 ENCOUNTER — Other Ambulatory Visit: Payer: Self-pay | Admitting: Critical Care Medicine

## 2019-09-24 ENCOUNTER — Ambulatory Visit: Payer: Self-pay | Admitting: Physician Assistant

## 2019-09-24 DIAGNOSIS — Z20822 Contact with and (suspected) exposure to covid-19: Secondary | ICD-10-CM

## 2019-09-24 DIAGNOSIS — E785 Hyperlipidemia, unspecified: Secondary | ICD-10-CM | POA: Insufficient documentation

## 2019-09-24 DIAGNOSIS — R7303 Prediabetes: Secondary | ICD-10-CM | POA: Insufficient documentation

## 2019-09-24 DIAGNOSIS — D649 Anemia, unspecified: Secondary | ICD-10-CM | POA: Insufficient documentation

## 2019-09-24 DIAGNOSIS — I10 Essential (primary) hypertension: Secondary | ICD-10-CM | POA: Insufficient documentation

## 2019-09-24 LAB — CBC
HCT: 42.7 % (ref 36.0–46.0)
Hemoglobin: 13.6 g/dL (ref 12.0–15.0)
MCH: 27.9 pg (ref 26.0–34.0)
MCHC: 31.9 g/dL (ref 30.0–36.0)
MCV: 87.7 fL (ref 80.0–100.0)
Platelets: 358 10*3/uL (ref 150–400)
RBC: 4.87 MIL/uL (ref 3.87–5.11)
RDW: 17.6 % — ABNORMAL HIGH (ref 11.5–15.5)
WBC: 8.5 10*3/uL (ref 4.0–10.5)
nRBC: 0 % (ref 0.0–0.2)

## 2019-09-24 LAB — COMPREHENSIVE METABOLIC PANEL
ALT: 21 U/L (ref 0–44)
AST: 22 U/L (ref 15–41)
Albumin: 3.6 g/dL (ref 3.5–5.0)
Alkaline Phosphatase: 70 U/L (ref 38–126)
Anion gap: 9 (ref 5–15)
BUN: 15 mg/dL (ref 6–20)
CO2: 25 mmol/L (ref 22–32)
Calcium: 8.5 mg/dL — ABNORMAL LOW (ref 8.9–10.3)
Chloride: 101 mmol/L (ref 98–111)
Creatinine, Ser: 0.64 mg/dL (ref 0.44–1.00)
GFR calc Af Amer: >60 mL/min (ref >60)
GFR calc non Af Amer: >60 mL/min (ref >60)
Glucose, Bld: 136 mg/dL — ABNORMAL HIGH (ref 70–99)
Potassium: 4.4 mmol/L (ref 3.5–5.1)
Sodium: 135 mmol/L (ref 135–145)
Total Bilirubin: 0.5 mg/dL (ref 0.3–1.2)
Total Protein: 7.1 g/dL (ref 6.5–8.1)

## 2019-09-24 LAB — LIPID PANEL
Cholesterol: 207 mg/dL — ABNORMAL HIGH (ref 0–200)
HDL: 48 mg/dL (ref >40)
LDL Cholesterol: 100 mg/dL — ABNORMAL HIGH (ref 0–99)
Total CHOL/HDL Ratio: 4.3 RATIO
Triglycerides: 296 mg/dL — ABNORMAL HIGH (ref <150)
VLDL: 59 mg/dL — ABNORMAL HIGH (ref 0–40)

## 2019-09-24 LAB — HEMOGLOBIN A1C
Hgb A1c MFr Bld: 6.6 % — ABNORMAL HIGH (ref 4.8–5.6)
Mean Plasma Glucose: 142.72 mg/dL

## 2019-09-24 NOTE — Patient Instructions (Addendum)
Cardiologist -   [phone]

## 2019-09-24 NOTE — Progress Notes (Signed)
LMP 03/05/2018    Subjective:    Patient ID: [name] [place], female    DOB: June 16, 1970, 49 y.o.   MRN: [id number]  HPI: Jazzmine Kleiman Lausch is a 49 y.o. female presenting on 09/24/2019 for No chief complaint on file.   HPI   This is a telemedicine appointment through [employer] due to coronavirus pandemic.  I connected with  Vernella C Defalco on 09/24/19 by a video enabled telemedicine application and verified that I am speaking with the correct person using two identifiers.   I discussed the limitations of evaluation and management by telemedicine. The patient expressed understanding and agreed to proceed.  Pt is in her parked car.  Provider is in office.    Pt is a 48yoF who was scheduled for in-person appointment today to follow up HTN but she had covid exposure so was changed to virtual appointment.    She went into her niece's house Thursday (4 days ago) and then found out that her niece was covid +  Pt just got out of hospital for HTN on 09/11/19.  She is not checking her bp at home.   She did not bring her medications with her and is uncertain what she is taking.  The only medication she knows for sure is losartan and a stomach medication that starts with an "O".  She Denies cough and congestion.    She has not yet received the covid vaccination despite being educated and encouraged to do so.       Relevant past medical, surgical, family and social history reviewed and updated as indicated. Interim medical history since our last visit reviewed. Allergies and medications reviewed and updated.   CURRENT MEDS: Losartan  ?omeprazole.     Review of Systems  Per HPI unless specifically indicated above     Objective:    LMP 03/05/2018   Wt Readings from Last 3 Encounters:  09/11/19 156 lb 4.9 oz (70.9 kg)  08/27/19 163 lb 11.2 oz (74.3 kg)  05/30/19 165 lb (74.8 kg)    Physical Exam Constitutional:      General: She is not in acute distress. HENT:     Head: Normocephalic  and atraumatic.  Pulmonary:     Effort: No respiratory distress.  Neurological:     Mental Status: She is alert and oriented to person, place, and time.  Psychiatric:        Attention and Perception: Attention normal.        Speech: Speech normal.        Behavior: Behavior is cooperative.         Assessment & Plan:     Encounter Diagnoses  Name Primary?  . Essential hypertension Yes  . Close exposure to COVID-19 virus      -lab results are pending as she just had them drawn prior to appointment -pt is scheduled for covid testing later today.   She is counseled to self-isolate until she gets her results.  Discussed with her that if she is negative, she really should go ahead and get vaccinated.  She says that would make sense.   -pt is given a monitor with which she can check her BP at home.  She is shown how to use it properly.   -cardiology attempted to contact pt but got no answer so pt is given the phone number to [place] them back.  Referral was made for her during recent hospitalization -pt is scheduled to follow up in office in  1 month.  She is to monitor her BP and contact office if it is running high.  She is reminded to bring all her meds with her to her appointment.

## 2019-09-26 LAB — SPECIMEN STATUS REPORT

## 2019-09-26 LAB — SARS-COV-2, NAA 2 DAY TAT

## 2019-09-26 LAB — NOVEL CORONAVIRUS, NAA: SARS-CoV-2, NAA: NOT DETECTED

## 2019-09-27 ENCOUNTER — Ambulatory Visit: Payer: Self-pay | Attending: Internal Medicine

## 2019-09-27 ENCOUNTER — Telehealth: Payer: Self-pay | Admitting: Student

## 2019-09-27 DIAGNOSIS — Z23 Encounter for immunization: Secondary | ICD-10-CM

## 2019-09-27 NOTE — Progress Notes (Signed)
   Covid-19 Vaccination Clinic  Name:  Beth Sanford    MRN: [id number] DOB: 1970-04-23  09/27/2019  [name] was observed post Covid-19 immunization for 15 minutes without incident. She was provided with Vaccine Information Sheet and instruction to access the V-Safe system.   [name] was instructed to [place] 911 with any severe reactions post vaccine: Marland Kitchen Difficulty breathing  . Swelling of face and throat  . A fast heartbeat  . A bad rash all over body  . Dizziness and weakness   Immunizations Administered    Name Date Dose VIS Date Route   Pfizer COVID-19 Vaccine 09/27/2019  3:23 PM 0.3 mL 02/28/2018 Intramuscular   Manufacturer: [employer], [employer]   Lot: [id number]   NDC: [id number]

## 2019-09-27 NOTE — Telephone Encounter (Signed)
LPN called and notified pt that her COVID test was negative. Pt verbalized understanding.

## 2019-10-15 ENCOUNTER — Ambulatory Visit: Payer: Self-pay | Admitting: Physician Assistant

## 2019-10-15 ENCOUNTER — Other Ambulatory Visit: Payer: Self-pay

## 2019-10-15 ENCOUNTER — Other Ambulatory Visit: Payer: Self-pay | Admitting: Critical Care Medicine

## 2019-10-15 DIAGNOSIS — R059 Cough, unspecified: Secondary | ICD-10-CM

## 2019-10-15 DIAGNOSIS — Z20822 Contact with and (suspected) exposure to covid-19: Secondary | ICD-10-CM

## 2019-10-15 DIAGNOSIS — R0981 Nasal congestion: Secondary | ICD-10-CM

## 2019-10-15 DIAGNOSIS — R52 Pain, unspecified: Secondary | ICD-10-CM

## 2019-10-15 DIAGNOSIS — R509 Fever, unspecified: Secondary | ICD-10-CM

## 2019-10-15 NOTE — Progress Notes (Signed)
° °  LMP 03/05/2018    Subjective:    Patient ID: Beth Sanford, female    DOB: 02-20-1970, 49 y.o.   MRN: [id number]  HPI: Beth Sanford is a 49 y.o. female presenting on 10/15/2019 for No chief complaint on file.   HPI   This is a telemedicine appointment through [employer] due to coronavirus pandemic.  I connected with  Tajanay C Hyneman on 10/15/19 by a video enabled telemedicine application and verified that I am speaking with the correct person using two identifiers.   I discussed the limitations of evaluation and management by telemedicine. The patient expressed understanding and agreed to proceed.  Pt is at home.  Provider is at office.    Pt is sick.  She says she started feeling Sick Saturday night while at work.  She is  Feverish but hasn't checked her temperature.  She is [place] and has ST, Congestion and cough.    She works [employer]   She got 1st dose covid vaccination 9/23 and appointment for 2nd dose october 14.       Relevant past medical, surgical, family and social history reviewed and updated as indicated. Interim medical history since our last visit reviewed. Allergies and medications reviewed and updated.  Review of Systems  Per HPI unless specifically indicated above     Objective:    LMP 03/05/2018   Wt Readings from Last 3 Encounters:  09/11/19 156 lb 4.9 oz (70.9 kg)  08/27/19 163 lb 11.2 oz (74.3 kg)  05/30/19 165 lb (74.8 kg)    Physical Exam Constitutional:      General: She is not in acute distress.    Appearance: She is ill-appearing.  HENT:     Head: Normocephalic and atraumatic.     Nose: Congestion present.  Pulmonary:     Effort: Pulmonary effort is normal. No respiratory distress.  Neurological:     Mental Status: She is alert and oriented to person, place, and time.  Psychiatric:        Attention and Perception: Attention normal.        Speech: Speech normal.        Behavior: Behavior is cooperative.           Assessment  & Plan:    Encounter Diagnoses  Name Primary?   Person under investigation for COVID-19 Yes   Subjective fever    Body aches    Cough    Nasal congestion     Pt is Scheduled for covid test.  She is Counseled on self-quarantine.  Discussed with pt that if her test is positive, she will be unable to get her second dose on Thursday.   She is emailed note to be out of work until negative covid test result.  Pt counseled rest, fluids, OTCs prn symptomatic needs.

## 2019-10-16 LAB — SPECIMEN STATUS REPORT

## 2019-10-16 LAB — NOVEL CORONAVIRUS, NAA: SARS-CoV-2, NAA: NOT DETECTED

## 2019-10-16 LAB — SARS-COV-2, NAA 2 DAY TAT

## 2019-10-17 ENCOUNTER — Telehealth: Payer: Self-pay | Admitting: Student

## 2019-10-17 NOTE — Telephone Encounter (Signed)
LPN called pt to notify of negative COVID test results, but was unable to leave vm to ask pt to [place] back as vm was full. Will try again at a later time.

## 2019-10-18 ENCOUNTER — Ambulatory Visit: Payer: Self-pay | Attending: Internal Medicine

## 2019-10-18 DIAGNOSIS — Z23 Encounter for immunization: Secondary | ICD-10-CM

## 2019-10-18 NOTE — Progress Notes (Signed)
   Covid-19 Vaccination Clinic  Name:  Beth Sanford    MRN: [id number] DOB: 1970-01-17  10/18/2019  [name] was observed post Covid-19 immunization for 15 minutes without incident. She was provided with Vaccine Information Sheet and instruction to access the V-Safe system.   [name] was instructed to [place] 911 with any severe reactions post vaccine: Marland Kitchen Difficulty breathing  . Swelling of face and throat  . A fast heartbeat  . A bad rash all over body  . Dizziness and weakness   Immunizations Administered    Name Date Dose VIS Date Route   Pfizer COVID-19 Vaccine 10/18/2019  3:45 PM 0.3 mL 02/28/2018 Intramuscular   Manufacturer: [employer], [employer]   Lot: [id number]   NDC: [address]-3

## 2019-10-18 NOTE — Telephone Encounter (Signed)
Pt called back 10-17-2019 and was notified by reception of negative COVID results.

## 2019-10-22 ENCOUNTER — Ambulatory Visit: Payer: Self-pay | Admitting: Physician Assistant

## 2019-10-22 ENCOUNTER — Encounter: Payer: Self-pay | Admitting: Physician Assistant

## 2019-10-22 ENCOUNTER — Other Ambulatory Visit: Payer: Self-pay

## 2019-10-22 VITALS — BP 179/117 | HR 99 | Temp 98.5°F

## 2019-10-22 DIAGNOSIS — E785 Hyperlipidemia, unspecified: Secondary | ICD-10-CM

## 2019-10-22 DIAGNOSIS — I10 Essential (primary) hypertension: Secondary | ICD-10-CM

## 2019-10-22 DIAGNOSIS — E119 Type 2 diabetes mellitus without complications: Secondary | ICD-10-CM

## 2019-10-22 MED ORDER — LOSARTAN POTASSIUM 100 MG PO TABS: 100.0000 mg | ORAL_TABLET | Freq: Every day | ORAL | 3 refills | Status: DC

## 2019-10-22 MED ORDER — METFORMIN HCL ER 500 MG PO TB24: 500.0000 mg | ORAL_TABLET | Freq: Every day | ORAL | 0 refills | Status: DC

## 2019-10-22 MED ORDER — ATORVASTATIN CALCIUM 40 MG PO TABS
40.0000 mg | ORAL_TABLET | Freq: Every day | ORAL | 0 refills | Status: DC
Start: 2019-10-22 — End: 2020-01-14

## 2019-10-22 MED ORDER — LOSARTAN POTASSIUM 100 MG PO TABS
100.0000 mg | ORAL_TABLET | Freq: Every day | ORAL | 0 refills | Status: DC
Start: 2019-10-22 — End: 2020-01-14

## 2019-10-22 NOTE — Patient Instructions (Signed)

## 2019-10-22 NOTE — Progress Notes (Signed)
BP (!) 179/117    Pulse 99    Temp 98.5 F (36.9 C)    LMP 03/05/2018    SpO2 98%    Subjective:    Patient ID: Beth Sanford, female    DOB: 06-04-1970, 49 y.o.   MRN: [id number]  HPI: Beth Sanford is a 49 y.o. female presenting on 10/22/2019 for No chief complaint on file.   HPI    Pt had a negative covid 19 screening questionnaire.     Pt is 48yoF with appointment to follow up HTN.   Again pt did not bring her meds with her to her appointment.    She says she can't get her bp machine to work (that was given to her by [facility] and she was shown how to use it)  and she didn't bring it with her.  She says she took her papers to [facility] to get signed up with [facility].  Pt thinks her BP is high today because she chased a little dog around.     Relevant past medical, surgical, family and social history reviewed and updated as indicated. Interim medical history since our last visit reviewed. Allergies and medications reviewed and updated.   CURRENT MEDS: Losartan    Review of Systems  Per HPI unless specifically indicated above     Objective:    BP (!) 179/117    Pulse 99    Temp 98.5 F (36.9 C)    LMP 03/05/2018    SpO2 98%   Wt Readings from Last 3 Encounters:  09/11/19 156 lb 4.9 oz (70.9 kg)  08/27/19 163 lb 11.2 oz (74.3 kg)  05/30/19 165 lb (74.8 kg)    Physical Exam Vitals reviewed.  Constitutional:      General: She is not in acute distress.    Appearance: She is well-developed. She is obese. She is not ill-appearing.  HENT:     Head: Normocephalic and atraumatic.  Cardiovascular:     Rate and Rhythm: Normal rate and regular rhythm.  Pulmonary:     Effort: Pulmonary effort is normal.     Breath sounds: Normal breath sounds.  Abdominal:     General: Bowel sounds are normal.     Palpations: Abdomen is soft. There is no mass.     Tenderness: There is no abdominal tenderness.  Musculoskeletal:     Cervical back: Neck supple.     Right  lower leg: No edema.     Left lower leg: No edema.  Lymphadenopathy:     Cervical: No cervical adenopathy.  Skin:    General: Skin is warm and dry.  Neurological:     Mental Status: She is alert and oriented to person, place, and time.  Psychiatric:        Behavior: Behavior normal.           Assessment & Plan:    Encounter Diagnoses  Name Primary?   Essential hypertension Yes   Hyperlipidemia, unspecified hyperlipidemia type    Diabetes mellitus without complication (HCC)      -reviewed labs with pt  -Increase losartan for uncontrolled HTN. -Add metformin for newly diagnosed DM.  She is counseled on diabetic diet and given handout.  Encouraged pt to become a label reader to help her manage her carbs and sugars -Add/renew atorvastatin -pt to follow up 1 month WITH ALL MEDS.  She is encouraged to Bring her bp machine with her.   -rx for losartan will be sent  to local pharmacy as she needs to get it today.  Other meds will be sent to [facility]

## 2019-11-06 ENCOUNTER — Telehealth: Payer: Self-pay

## 2019-11-06 NOTE — Telephone Encounter (Signed)
Pt was contacted for enrollment into the Remote Monitoring in the [facility] Hypertension Program per a referral sent by the medical provider [facility] to assist with BP management and health coaching  Pt was available to talk about program and enrollment requirements and stated her readiness to move forth with the enrollment process.  She stated she had the appropriate Wi-Fi Connections and Smartphone device to obtain the connectivity needed for BP and Scale Readings      Enrollment appt was made for Wed, Nov 07, 2019 at 3:30pm at [facility]  Reminder appt will be sent by text message on today

## 2019-11-19 ENCOUNTER — Encounter: Payer: Self-pay | Admitting: Physician Assistant

## 2019-11-19 ENCOUNTER — Ambulatory Visit: Payer: Self-pay | Admitting: Physician Assistant

## 2019-11-19 VITALS — BP 140/108 | HR 77 | Temp 97.9°F | Ht 62.5 in | Wt 167.0 lb

## 2019-11-19 DIAGNOSIS — E119 Type 2 diabetes mellitus without complications: Secondary | ICD-10-CM

## 2019-11-19 DIAGNOSIS — E669 Obesity, unspecified: Secondary | ICD-10-CM

## 2019-11-19 DIAGNOSIS — R079 Chest pain, unspecified: Secondary | ICD-10-CM

## 2019-11-19 DIAGNOSIS — G8929 Other chronic pain: Secondary | ICD-10-CM

## 2019-11-19 DIAGNOSIS — I10 Essential (primary) hypertension: Secondary | ICD-10-CM

## 2019-11-19 DIAGNOSIS — E785 Hyperlipidemia, unspecified: Secondary | ICD-10-CM

## 2019-11-19 MED ORDER — CARVEDILOL 6.25 MG PO TABS
6.2500 mg | ORAL_TABLET | Freq: Two times a day (BID) | ORAL | 0 refills | Status: DC
Start: 2019-11-19 — End: 2020-01-14

## 2019-11-19 MED ORDER — NITROGLYCERIN 0.4 MG SL SUBL: 0.4000 mg | SUBLINGUAL_TABLET | SUBLINGUAL | 3 refills | Status: DC | PRN

## 2019-11-19 NOTE — Patient Instructions (Signed)
Increase omeprazole to twice daily

## 2019-11-19 NOTE — Progress Notes (Signed)
BP (!) 140/108   Pulse 77   Temp 97.9 F (36.6 C)   Ht 5' 2.5" (1.588 m)   Wt 167 lb (75.8 kg)   LMP 03/05/2018   SpO2 97%   BMI 30.06 kg/m    Subjective:    Patient ID: Beth Sanford, female    DOB: 08-21-1970, 49 y.o.   MRN: [id number]  HPI: Beth Sanford is a 49 y.o. female presenting on 11/19/2019 for Hypertension   HPI    Pt had a negative covid 19 screening questionnaire.   Chief Complaint  Patient presents with  . Hypertension      She is still having CP.  She is not taking her coreg.  She doesn't think she ever got it from the pharmacy after being discharged for CP in 09/24/19.     She has appt with cardiology for 01/07/20.    She is still working at [employer]  She is still smoking but not much      Relevant past medical, surgical, family and social history reviewed and updated as indicated. Interim medical history since our last visit reviewed. Allergies and medications reviewed and updated.   Current Outpatient Medications:  .  atorvastatin (LIPITOR) 40 MG tablet, Take 1 tablet (40 mg total) by mouth daily., Disp: 90 tablet, Rfl: 0 .  losartan (COZAAR) 100 MG tablet, Take 1 tablet (100 mg total) by mouth daily., Disp: 90 tablet, Rfl: 0 .  metFORMIN (GLUCOPHAGE XR) 500 MG 24 hr tablet, Take 1 tablet (500 mg total) by mouth daily with breakfast., Disp: 90 tablet, Rfl: 0 .  omeprazole (PRILOSEC) 20 MG capsule, Take 20 mg by mouth daily. , Disp: , Rfl:  .  carvedilol (COREG) 6.25 MG tablet, Take 1 tablet (6.25 mg total) by mouth 2 (two) times daily with a meal. (Patient not taking: Reported on 09/24/2019), Disp: 60 tablet, Rfl: 0     Review of Systems  Per HPI unless specifically indicated above     Objective:    BP (!) 140/108   Pulse 77   Temp 97.9 F (36.6 C)   Ht 5' 2.5" (1.588 m)   Wt 167 lb (75.8 kg)   LMP 03/05/2018   SpO2 97%   BMI 30.06 kg/m   Wt Readings from Last 3 Encounters:  11/19/19 167 lb (75.8 kg)  09/11/19 156 lb 4.9  oz (70.9 kg)  08/27/19 163 lb 11.2 oz (74.3 kg)    Physical Exam Vitals reviewed.  Constitutional:      General: She is not in acute distress.    Appearance: She is well-developed. She is not ill-appearing.  HENT:     Head: Normocephalic and atraumatic.  Cardiovascular:     Rate and Rhythm: Normal rate and regular rhythm.  Pulmonary:     Effort: Pulmonary effort is normal.     Breath sounds: Normal breath sounds.  Abdominal:     General: Bowel sounds are normal.     Palpations: Abdomen is soft. There is no mass.     Tenderness: There is no abdominal tenderness.  Musculoskeletal:     Cervical back: Neck supple.     Right lower leg: No edema.     Left lower leg: No edema.  Lymphadenopathy:     Cervical: No cervical adenopathy.  Skin:    General: Skin is warm and dry.  Neurological:     Mental Status: She is alert and oriented to person, place, and time.  Psychiatric:  Behavior: Behavior normal.             Assessment & Plan:    Encounter Diagnoses  Name Primary?  . Essential hypertension Yes  . Hyperlipidemia, unspecified hyperlipidemia type   . Diabetes mellitus without complication (HCC)   . Chronic chest pain   . Obesity, unspecified classification, unspecified obesity type, unspecified whether serious comorbidity present      -will increease omeprazole to bid -pt was Given nitrostat rx and counseled on how to use it properly including to go to ER if CP persists after 3 doses -pt to get on her coreg.  rx for Coreg sent to [facility]. -pt to follow up 1 month to recheck bp and update labs.  She is to contact office sooner prn

## 2019-12-07 ENCOUNTER — Other Ambulatory Visit: Payer: Self-pay | Admitting: Physician Assistant

## 2019-12-08 ENCOUNTER — Encounter (HOSPITAL_COMMUNITY): Payer: Self-pay | Admitting: Emergency Medicine

## 2019-12-08 ENCOUNTER — Emergency Department (HOSPITAL_COMMUNITY)
Admission: EM | Admit: 2019-12-08 | Discharge: 2019-12-08 | Disposition: A | Discharge status: Home / Self Care | Payer: Medicaid Other | Attending: Emergency Medicine | Admitting: Emergency Medicine

## 2019-12-08 ENCOUNTER — Other Ambulatory Visit: Payer: Self-pay

## 2019-12-08 DIAGNOSIS — Z7984 Long term (current) use of oral hypoglycemic drugs: Secondary | ICD-10-CM | POA: Diagnosis not present

## 2019-12-08 DIAGNOSIS — M109 Gout, unspecified: Secondary | ICD-10-CM | POA: Insufficient documentation

## 2019-12-08 DIAGNOSIS — Z79899 Other long term (current) drug therapy: Secondary | ICD-10-CM | POA: Insufficient documentation

## 2019-12-08 DIAGNOSIS — I509 Heart failure, unspecified: Secondary | ICD-10-CM | POA: Insufficient documentation

## 2019-12-08 DIAGNOSIS — F17210 Nicotine dependence, cigarettes, uncomplicated: Secondary | ICD-10-CM | POA: Diagnosis not present

## 2019-12-08 DIAGNOSIS — M21612 Bunion of left foot: Secondary | ICD-10-CM | POA: Insufficient documentation

## 2019-12-08 DIAGNOSIS — M79672 Pain in left foot: Secondary | ICD-10-CM | POA: Diagnosis present

## 2019-12-08 DIAGNOSIS — M21619 Bunion of unspecified foot: Secondary | ICD-10-CM

## 2019-12-08 DIAGNOSIS — I110 Hypertensive heart disease with heart failure: Secondary | ICD-10-CM | POA: Insufficient documentation

## 2019-12-08 LAB — URIC ACID: Uric Acid, Serum: 7.8 mg/dL — ABNORMAL HIGH (ref 2.5–7.1)

## 2019-12-08 MED ORDER — NAPROXEN 250 MG PO TABS: ORAL_TABLET | ORAL | 0 refills | Status: DC

## 2019-12-08 MED ORDER — COLCHICINE 0.6 MG PO TABS: 0.6000 mg | ORAL_TABLET | Freq: Every day | ORAL | 0 refills | Status: DC

## 2019-12-08 MED ORDER — KETOROLAC TROMETHAMINE 30 MG/ML IJ SOLN
30.0000 mg | Freq: Once | INTRAMUSCULAR | Status: AC
  Administered 2019-12-08: 30 mg via INTRAMUSCULAR
  Filled 2019-12-08: qty 1

## 2019-12-08 NOTE — ED Provider Notes (Addendum)
[facility] EMERGENCY DEPARTMENT Provider Note   CSN: [id number] Arrival date & time: 12/08/19  3790   Time seen 6:08 AM  History Chief Complaint  Patient presents with  . Foot Pain    Beth Sanford is a 49 y.o. female.  HPI   Patient states "my foot is killing me".  She indicates her left foot.  She states she has never had any problem before other than having a bunion.  She denies any fevers.  Please note history is difficult because patient falls asleep and starts snoring while talking to her.  She denies having history of gout and does not recall anybody in her family having gout.  She states she wears tennis shoes and she wears nonslip shoes at work.  PCP Jacquelin Hawking, PA-C   Past Medical History:  Diagnosis Date  . Anxiety   . CHF (congestive heart failure) (HCC)   . Depression   . High cholesterol   . Hypertension   . OSA (obstructive sleep apnea) 09/13/2014    Patient Active Problem List   Diagnosis Date Noted  . Gastroesophageal reflux disease   . Hypertensive urgency 09/10/2019  . Melena 05/21/2019  . Hypertension 05/21/2019  . Abnormal LFTs 09/16/2015  . Tobacco abuse 12/18/2014  . OSA (obstructive sleep apnea) 09/13/2014  . Major depression 06/08/2013  . Chest pain 11/01/2012  . Essential hypertension, malignant 11/01/2012  . Hyperlipidemia 11/01/2012    Past Surgical History:  Procedure Laterality Date  . BIOPSY  05/30/2019   Procedure: BIOPSY;  Surgeon: Malissa Hippo, MD;  Location: [facility] ENDO [facility];  Service: Endoscopy;;  esophagus  . CHOLECYSTECTOMY    . ELBOW SURGERY  left  . ESOPHAGOGASTRODUODENOSCOPY N/A 05/30/2019   Procedure: ESOPHAGOGASTRODUODENOSCOPY (EGD);  Surgeon: Malissa Hippo, MD;  Location: [facility] ENDO [facility];  Service: Endoscopy;  Laterality: N/A;  150     OB History    Gravida  3   Para  1   Term      Preterm      AB  2   Living        SAB      TAB      Ectopic      Multiple      Live Births               Family History  Problem Relation Age of Onset  . Alcohol abuse Father   . Heart failure Father        deceased at age 50  . Hypertension Brother   . Bipolar disorder Brother   . Emphysema Maternal Grandfather   . Colon cancer Neg Hx     Social History   Tobacco Use  . Smoking status: Current Some Day Smoker    Packs/day: 0.00    Years: 33.00    Pack years: 0.00    Types: Cigarettes    Start date: 01/05/1984  . Smokeless tobacco: Never Used  . Tobacco comment: 2-3 cig week  Vaping Use  . Vaping Use: Never used  Substance Use Topics  . Alcohol use: No    Alcohol/week: 0.0 standard drinks  . Drug use: No    Home Medications Prior to Admission medications   Medication Sig Start Date End Date Taking? Authorizing Provider  atorvastatin (LIPITOR) 40 MG tablet Take 1 tablet (40 mg total) by mouth daily. 10/22/19   Jacquelin Hawking, PA-C  carvedilol (COREG) 6.25 MG tablet Take 1 tablet (6.25 mg total) by mouth 2 (  two) times daily with a meal. 11/19/19   Jacquelin Hawking, PA-C  colchicine 0.6 MG tablet Take 1 tablet (0.6 mg total) by mouth daily. 12/08/19   Devoria Albe, MD  losartan (COZAAR) 100 MG tablet Take 1 tablet (100 mg total) by mouth daily. 10/22/19   Jacquelin Hawking, PA-C  metFORMIN (GLUCOPHAGE XR) 500 MG 24 hr tablet Take 1 tablet (500 mg total) by mouth daily with breakfast. 10/22/19   Jacquelin Hawking, PA-C  naproxen (NAPROSYN) 250 MG tablet Take 1 po BID with food prn pain 12/08/19   Devoria Albe, MD  nitroGLYCERIN (NITROSTAT) 0.4 MG SL tablet Place 1 tablet (0.4 mg total) under the tongue every 5 (five) minutes as needed for chest pain. 11/19/19   Jacquelin Hawking, PA-C  omeprazole (PRILOSEC) 20 MG capsule Take 20 mg by mouth in the morning and at bedtime.    [provider]    Allergies    Codeine and Tramadol  Review of Systems   Review of Systems  All other systems reviewed and are negative.   Physical Exam Updated Vital Signs BP 107/71   Pulse  71   Temp 97.8 F (36.6 C) (Oral)   Resp 18   Ht 5\' 2"  (1.575 m)   Wt 77.1 kg   LMP 03/05/2018   SpO2 96%   BMI 31.09 kg/m   Physical Exam Vitals and nursing note reviewed.  Constitutional:      Appearance: Normal appearance. She is obese.     Comments: Patient is laying on her side sound asleep.  I had to turn out the lights and say her name several times to get her to wake up.  She falls asleep frequently during our interview and exam.  HENT:     Head: Normocephalic and atraumatic.     Right Ear: External ear normal.     Left Ear: External ear normal.  Eyes:     Extraocular Movements: Extraocular movements intact.     Conjunctiva/sclera: Conjunctivae normal.  Cardiovascular:     Rate and Rhythm: Normal rate.  Pulmonary:     Effort: Pulmonary effort is normal. No respiratory distress.  Musculoskeletal:        General: Normal range of motion.     Cervical back: Normal range of motion.     Comments: On exam of patient's feet she is noted to have some bunions bilaterally however the left has a lot of swelling that may be joint effusion present.  There is some redness but minimal warmth to touch.  When I palpated she does not appear to be extremely tender.  She has some mild swelling of the dorsum of her foot.  She has good distal pulses and capillary refill and normal temperature of the skin of her toes.  Neurological:     General: No focal deficit present.     Mental Status: She is alert and oriented to person, place, and time.     Cranial Nerves: No cranial nerve deficit.  Psychiatric:        Mood and Affect: Affect is flat.        Speech: Speech is delayed.        Behavior: Behavior is slowed.       ED Results / Procedures / Treatments   Labs (all labs ordered are listed, but only abnormal results are displayed) Results for orders placed or performed during the hospital encounter of 12/08/19  Uric acid  Result Value Ref Range   Uric Acid, Serum  7.8 (H) 2.5 - 7.1  mg/dL   Laboratory interpretation all normal except mildly elevated uric acid    EKG None  Radiology No results found.  Procedures Procedures (including critical care time)  Medications Ordered in ED Medications  ketorolac (TORADOL) 30 MG/ML injection 30 mg (30 mg Intramuscular Given 12/08/19 0631)    ED Course  I have reviewed the triage vital signs and the nursing notes.  Pertinent labs & imaging results that were available during my care of the patient were reviewed by me and considered in my medical decision making (see chart for details).    MDM Rules/Calculators/A&P                          I suspect patient has bunions and may have an acute gouty flareup.  Uric acid level was done.  Patient was given Toradol IM.  Patient's uric acid was mildly elevated.  She was discharged home with colchicine and naproxen, I gave her a lower dose due to her history of congestive heart failure.  She can follow-up with her primary care doctor about her gout.  She should consider seeing a podiatrist about her bunions.  When patient was rechecked at discharge she states her pain was better after the Toradol injection.  She was given her test results and discharge instructions.  Final Clinical Impression(s) / ED Diagnoses Final diagnoses:  Podagra  Bunion of great toe    Rx / DC Orders ED Discharge Orders         Ordered    naproxen (NAPROSYN) 250 MG tablet        12/08/19 0708    colchicine 0.6 MG tablet  Daily        12/08/19 0708         Plan discharge  Devoria Albe, MD, Concha Pyo, MD 12/08/19 2778    Devoria Albe, MD 12/08/19 [phone]

## 2019-12-08 NOTE — Discharge Instructions (Addendum)
Use ice and heat for comfort.  Your uric acid level was elevated, this is a gouty flareup.  Please look at the low purine diet and avoid things that can make it flareup.  You should consider seeing a podiatrist about your bunions.

## 2019-12-08 NOTE — ED Triage Notes (Signed)
Pt states she has had foot pain for a while now. Pt states due to favoring left foot it has caused her hip to also hurt.

## 2019-12-20 ENCOUNTER — Other Ambulatory Visit: Payer: Self-pay | Admitting: Physician Assistant

## 2019-12-20 DIAGNOSIS — E119 Type 2 diabetes mellitus without complications: Secondary | ICD-10-CM

## 2019-12-20 DIAGNOSIS — E785 Hyperlipidemia, unspecified: Secondary | ICD-10-CM

## 2019-12-20 DIAGNOSIS — I10 Essential (primary) hypertension: Secondary | ICD-10-CM

## 2019-12-20 NOTE — Progress Notes (Unsigned)
cm

## 2019-12-25 NOTE — Progress Notes (Deleted)
CARDIOLOGY CONSULT NOTE       Patient ID: Beth Sanford MRN: [id number] DOB/AGE: 50/23/1972 50 y.o.  Admit date: (Not on file) Referring Physician: Katherene Ponto Primary Physician: Jacquelin Hawking, PA-C Primary Cardiologist: New Reason for Consultation: Chest Pain  Active Problems:   * No active hospital problems. *   HPI:  49 y.o. referred by Jacquelin Hawking PA for chest pain. History of HTN, OSA, HLD and anxiety She is a current every day smoker Admitted to [facility] with central non radiating chest pain intermittent over 2 weeks on 09/11/19. Episodes last about 10 minutes Improved with Tums Also worse with coughing She is non compliant with her BP meds Stress with homelessness at times. Father had CHF and died of MI at age 98 She r/o with no acute ECG changes TTE reviewed 09/11/19 EF 70-75% moderate LVH no significant valve disease or effusion. Has not had recent stress testing  ***  ROS All other systems reviewed and negative except as noted above  Past Medical History:  Diagnosis Date  . Anxiety   . CHF (congestive heart failure) (HCC)   . Depression   . High cholesterol   . Hypertension   . OSA (obstructive sleep apnea) 09/13/2014    Family History  Problem Relation Age of Onset  . Alcohol abuse Father   . Heart failure Father        deceased at age 61  . Hypertension Brother   . Bipolar disorder Brother   . Emphysema Maternal Grandfather   . Colon cancer Neg Hx     Social History   Socioeconomic History  . Marital status: Single    Spouse name: Not on file  . Number of children: Not on file  . Years of education: Not on file  . Highest education level: Not on file  Occupational History  . Not on file  Tobacco Use  . Smoking status: Current Some Day Smoker    Packs/day: 0.00    Years: 33.00    Pack years: 0.00    Types: Cigarettes    Start date: 01/05/1984  . Smokeless tobacco: Never Used  . Tobacco comment: 2-3 cig week  Vaping Use  . Vaping Use: Never used   Substance and Sexual Activity  . Alcohol use: No    Alcohol/week: 0.0 standard drinks  . Drug use: No  . Sexual activity: Not Currently    Birth control/protection: None  Other Topics Concern  . Not on file  Social History Narrative  . Not on file   Social Determinants of Health   Financial Resource Strain: Not on file  Food Insecurity: Not on file  Transportation Needs: Not on file  Physical Activity: Not on file  Stress: Not on file  Social Connections: Not on file  Intimate Partner Violence: Not on file    Past Surgical History:  Procedure Laterality Date  . BIOPSY  05/30/2019   Procedure: BIOPSY;  Surgeon: Malissa Hippo, MD;  Location[facility];  Service: Endoscopy;;  esophagus  . CHOLECYSTECTOMY    . ELBOW SURGERY  left  . ESOPHAGOGASTRODUODENOSCOPY N/A 05/30/2019   Procedure: ESOPHAGOGASTRODUODENOSCOPY (EGD);  Surgeon: Malissa Hippo, MD;  Location: [facility];  Service: Endoscopy;  Laterality: N/A;  150      Current Outpatient Medications:  .  atorvastatin (LIPITOR) 40 MG tablet, Take 1 tablet (40 mg total) by mouth daily., Disp: 90 tablet, Rfl: 0 .  carvedilol (COREG) 6.25 MG tablet, Take 1 tablet (6.25  mg total) by mouth 2 (two) times daily with a meal., Disp: 180 tablet, Rfl: 0 .  colchicine 0.6 MG tablet, Take 1 tablet (0.6 mg total) by mouth daily., Disp: 10 tablet, Rfl: 0 .  losartan (COZAAR) 100 MG tablet, Take 1 tablet (100 mg total) by mouth daily., Disp: 90 tablet, Rfl: 0 .  metFORMIN (GLUCOPHAGE XR) 500 MG 24 hr tablet, Take 1 tablet (500 mg total) by mouth daily with breakfast., Disp: 90 tablet, Rfl: 0 .  naproxen (NAPROSYN) 250 MG tablet, Take 1 po BID with food prn pain, Disp: 20 tablet, Rfl: 0 .  nitroGLYCERIN (NITROSTAT) 0.4 MG SL tablet, Place 1 tablet (0.4 mg total) under the tongue every 5 (five) minutes as needed for chest pain., Disp: 25 tablet, Rfl: 3 .  omeprazole (PRILOSEC) 20 MG capsule, Take 20 mg by mouth in the morning and at  bedtime., Disp: , Rfl:  .  omeprazole (PRILOSEC) 40 MG capsule, TAKE 1 Capsule BY MOUTH ONCE EVERY DAY, Disp: 45 capsule, Rfl: 0    Physical Exam: Last menstrual period 03/05/2018.    Affect appropriate Healthy:  appears stated age HEENT: normal Neck supple with no adenopathy JVP normal no bruits no thyromegaly Lungs clear with no wheezing and good diaphragmatic motion Heart:  S1/S2 no murmur, no rub, gallop or click PMI normal Abdomen: benighn, BS positve, no tenderness, no AAA no bruit.  No HSM or HJR Distal pulses intact with no bruits No edema Neuro non-focal Skin warm and dry No muscular weakness   Labs:   Lab Results  Component Value Date   WBC 8.5 09/24/2019   HGB 13.6 09/24/2019   HCT 42.7 09/24/2019   MCV 87.7 09/24/2019   PLT 358 09/24/2019   No results for input(s): NA, K, CL, CO2, BUN, CREATININE, CALCIUM, PROT, BILITOT, ALKPHOS, ALT, AST, GLUCOSE in the last 168 hours.  Invalid input(s): LABALBU Lab Results  Component Value Date   TROPONINI <0.03 10/03/2017    Lab Results  Component Value Date   CHOL 207 (H) 09/24/2019   CHOL 252 (H) 10/16/2018   CHOL 172 07/17/2018   Lab Results  Component Value Date   HDL 48 09/24/2019   HDL 73 10/16/2018   HDL 55 07/17/2018   Lab Results  Component Value Date   LDLCALC 100 (H) 09/24/2019   LDLCALC 145 (H) 10/16/2018   LDLCALC 68 07/17/2018   Lab Results  Component Value Date   TRIG 296 (H) 09/24/2019   TRIG 172 (H) 10/16/2018   TRIG 247 (H) 07/17/2018   Lab Results  Component Value Date   CHOLHDL 4.3 09/24/2019   CHOLHDL 3.5 10/16/2018   CHOLHDL 3.1 07/17/2018   No results found for: LDLDIRECT    Radiology: No results found.  EKG: 09/12/19 SR poor R wave progression non specific ST changes    ASSESSMENT AND PLAN:   1. Chest Pain:  CRF;s HTN, family history and HLD. ECG abnormal with poor R wave progression and non specific ST changes Pain atypical with r/o in hospital September 2021  ***  2. HTN:  Stressed diet and compliance continue coreg and losartan  3. HLD:  Continue statin   4. DM:  Discussed low carb diet.  Target hemoglobin A1c is 6.5 or less.  Continue current medications.  5. OSA:  Not compliant with CPAP discussed weight loss  6. GERD: may be related to pains in chest continue prilosec  ***  Signed: Charlton Haws 12/25/2019, 9:17 AM

## 2020-01-07 ENCOUNTER — Ambulatory Visit: Payer: Self-pay | Admitting: Cardiovascular Disease

## 2020-01-14 ENCOUNTER — Ambulatory Visit: Payer: Self-pay | Admitting: Physician Assistant

## 2020-01-14 ENCOUNTER — Encounter: Payer: Self-pay | Admitting: Physician Assistant

## 2020-01-14 ENCOUNTER — Other Ambulatory Visit: Payer: Self-pay

## 2020-01-14 VITALS — BP 154/98 | HR 87 | Temp 98.0°F | Ht 62.5 in | Wt 169.0 lb

## 2020-01-14 DIAGNOSIS — E785 Hyperlipidemia, unspecified: Secondary | ICD-10-CM

## 2020-01-14 DIAGNOSIS — I10 Essential (primary) hypertension: Secondary | ICD-10-CM

## 2020-01-14 DIAGNOSIS — F17200 Nicotine dependence, unspecified, uncomplicated: Secondary | ICD-10-CM

## 2020-01-14 DIAGNOSIS — E119 Type 2 diabetes mellitus without complications: Secondary | ICD-10-CM

## 2020-01-14 DIAGNOSIS — Z91199 Patient's noncompliance with other medical treatment and regimen due to unspecified reason: Secondary | ICD-10-CM

## 2020-01-14 DIAGNOSIS — R079 Chest pain, unspecified: Secondary | ICD-10-CM

## 2020-01-14 DIAGNOSIS — E669 Obesity, unspecified: Secondary | ICD-10-CM

## 2020-01-14 DIAGNOSIS — Z9119 Patient's noncompliance with other medical treatment and regimen: Secondary | ICD-10-CM

## 2020-01-14 MED ORDER — METFORMIN HCL ER 500 MG PO TB24: 500.0000 mg | ORAL_TABLET | Freq: Every day | ORAL | 0 refills | Status: DC

## 2020-01-14 MED ORDER — OMEPRAZOLE 40 MG PO CPDR: DELAYED_RELEASE_CAPSULE | ORAL | 0 refills | Status: DC

## 2020-01-14 MED ORDER — CARVEDILOL 6.25 MG PO TABS
6.2500 mg | ORAL_TABLET | Freq: Two times a day (BID) | ORAL | 0 refills | Status: DC
Start: 2020-01-14 — End: 2022-07-29

## 2020-01-14 MED ORDER — NITROGLYCERIN 0.4 MG SL SUBL: 0.4000 mg | SUBLINGUAL_TABLET | SUBLINGUAL | 3 refills | Status: DC | PRN

## 2020-01-14 MED ORDER — LOSARTAN POTASSIUM 100 MG PO TABS
100.0000 mg | ORAL_TABLET | Freq: Every day | ORAL | 0 refills | Status: DC
Start: 2020-01-14 — End: 2022-07-29

## 2020-01-14 MED ORDER — ATORVASTATIN CALCIUM 40 MG PO TABS: 40.0000 mg | ORAL_TABLET | Freq: Every day | ORAL | 0 refills | Status: DC

## 2020-01-14 NOTE — Progress Notes (Signed)
BP (!) 154/98   Pulse 87   Temp 98 F (36.7 C)   Ht 5' 2.5" (1.588 m)   Wt 169 lb (76.7 kg)   LMP 03/05/2018   SpO2 98%   BMI 30.42 kg/m    Subjective:    Patient ID: Beth Sanford, female    DOB: Dec 14, 1970, 50 y.o.   MRN: [id number]  HPI: Beth Sanford is a 50 y.o. female presenting on 01/14/2020 for Hypertension, Diabetes, Hyperlipidemia, and Chest Pain   HPI  Pt had a negative covid 19 screening questionnaire.   Chief Complaint  Patient presents with  . Hypertension  . Diabetes  . Hyperlipidemia  . Chest Pain    Pt says she is Not having chest pain right now.   CP was "real bad" on Saturday while at work.  She called out of work on Sunday due to not feelig well due to CP.  Pt had appointment with cardiology on 01/07/20 but it was rescheduled due to inclement weather.   She was referred to cardiology on 08/27/19.    Pt is out of all meds because she didn't [place] for refills.    Pt reports a lot of stress with recent tragedies.    She didn't get labs drawn.  She says no one called her.    Relevant past medical, surgical, family and social history reviewed and updated as indicated. Interim medical history since our last visit reviewed. Allergies and medications reviewed and updated.    Current Outpatient Medications:  .  atorvastatin (LIPITOR) 40 MG tablet, Take 1 tablet (40 mg total) by mouth daily. (Patient not taking: Reported on 01/14/2020), Disp: 90 tablet, Rfl: 0 .  carvedilol (COREG) 6.25 MG tablet, Take 1 tablet (6.25 mg total) by mouth 2 (two) times daily with a meal. (Patient not taking: Reported on 01/14/2020), Disp: 180 tablet, Rfl: 0 .  colchicine 0.6 MG tablet, Take 1 tablet (0.6 mg total) by mouth daily., Disp: 10 tablet, Rfl: 0 .  losartan (COZAAR) 100 MG tablet, Take 1 tablet (100 mg total) by mouth daily. (Patient not taking: Reported on 01/14/2020), Disp: 90 tablet, Rfl: 0 .  metFORMIN (GLUCOPHAGE XR) 500 MG 24 hr tablet, Take 1 tablet (500 mg  total) by mouth daily with breakfast. (Patient not taking: Reported on 01/14/2020), Disp: 90 tablet, Rfl: 0 .  naproxen (NAPROSYN) 250 MG tablet, Take 1 po BID with food prn pain (Patient not taking: Reported on 01/14/2020), Disp: 20 tablet, Rfl: 0 .  nitroGLYCERIN (NITROSTAT) 0.4 MG SL tablet, Place 1 tablet (0.4 mg total) under the tongue every 5 (five) minutes as needed for chest pain. (Patient not taking: Reported on 01/14/2020), Disp: 25 tablet, Rfl: 3 .  omeprazole (PRILOSEC) 20 MG capsule, Take 20 mg by mouth in the morning and at bedtime. (Patient not taking: Reported on 01/14/2020), Disp: , Rfl:  .  omeprazole (PRILOSEC) 40 MG capsule, TAKE 1 Capsule BY MOUTH ONCE EVERY DAY (Patient not taking: Reported on 01/14/2020), Disp: 45 capsule, Rfl: 0     Review of Systems  Per HPI unless specifically indicated above     Objective:    BP (!) 154/98   Pulse 87   Temp 98 F (36.7 C)   Ht 5' 2.5" (1.588 m)   Wt 169 lb (76.7 kg)   LMP 03/05/2018   SpO2 98%   BMI 30.42 kg/m   Wt Readings from Last 3 Encounters:  01/14/20 169 lb (76.7 kg)  12/08/19 170  lb (77.1 kg)  11/19/19 167 lb (75.8 kg)    Physical Exam Vitals reviewed.  Constitutional:      General: She is not in acute distress.    Appearance: She is well-developed and well-nourished. She is not ill-appearing.  HENT:     Head: Normocephalic and atraumatic.  Cardiovascular:     Rate and Rhythm: Normal rate and regular rhythm.  Pulmonary:     Effort: Pulmonary effort is normal.     Breath sounds: Normal breath sounds.  Abdominal:     General: Bowel sounds are normal.     Palpations: Abdomen is soft. There is no hepatosplenomegaly or mass.     Tenderness: There is no abdominal tenderness.  Musculoskeletal:        General: No edema.     Cervical back: Neck supple.     Right lower leg: No edema.     Left lower leg: No edema.  Lymphadenopathy:     Cervical: No cervical adenopathy.  Skin:    General: Skin is warm and dry.   Neurological:     Mental Status: She is alert and oriented to person, place, and time.  Psychiatric:        Mood and Affect: Mood and affect normal.        Behavior: Behavior normal.        EKG- NSR no changes from EKG done 09/12/19     Assessment & Plan:    Encounter Diagnoses  Name Primary?  . Essential hypertension Yes  . Hyperlipidemia, unspecified hyperlipidemia type   . Diabetes mellitus without complication (HCC)   . Obesity, unspecified classification, unspecified obesity type, unspecified whether serious comorbidity present   . Tobacco use disorder   . Chest pain, unspecified type   . Personal history of noncompliance with medical treatment, presenting hazards to health      -pt urged to Get back on meds TODAY, particularly the BP meds -she is Reminded on how to properly use the NTG including to go to ER if CP persists after 3 doses.  Also recommended low dose aspirin daily until seen by cardiology -she was given Note for work to avoid lifting over 10 pounds until she has been evaluated by cardiology on 2/14 -pt to follow up here 1 month.  She is to contact office sooner prn

## 2020-01-24 ENCOUNTER — Other Ambulatory Visit: Payer: Self-pay | Admitting: Physician Assistant

## 2020-01-24 MED ORDER — IRBESARTAN 300 MG PO TABS: 300.0000 mg | ORAL_TABLET | Freq: Every day | ORAL | 1 refills | Status: DC

## 2020-01-28 ENCOUNTER — Ambulatory Visit: Payer: Self-pay | Admitting: Physician Assistant

## 2020-01-29 ENCOUNTER — Ambulatory Visit: Payer: Self-pay | Admitting: Physician Assistant

## 2020-01-29 VITALS — Temp 98.0°F

## 2020-01-29 DIAGNOSIS — Z20822 Contact with and (suspected) exposure to covid-19: Secondary | ICD-10-CM

## 2020-01-29 DIAGNOSIS — R0981 Nasal congestion: Secondary | ICD-10-CM

## 2020-01-29 NOTE — Progress Notes (Signed)
Pt is in the office today to update her flu vaccine.  Pt is reporting not feeling well and having head congestion for the past week. Pt denies any fever - pt's temp today is 52F. Pt states she did a home COVID test on Sunday night and it was negative.  The flu vaccine will be postponed due to pt being symptomatic. Pt was informed that home COVID tests are not very accurate, so pt was urged to get COVID tested at a COVID testing site. Pt was given information on mobile COVID testing site with [facility] in [place] for pt to get tested as the testing site in [place] has already been filled. pt is to [facility] with results and to get rescheduled for the flu shot. Pt verbalized understanding.  LPN discussed plan with PA and PA is in agreement.

## 2020-02-07 ENCOUNTER — Ambulatory Visit: Payer: Self-pay | Admitting: Physician Assistant

## 2020-02-07 DIAGNOSIS — R0602 Shortness of breath: Secondary | ICD-10-CM

## 2020-02-07 DIAGNOSIS — R69 Illness, unspecified: Secondary | ICD-10-CM

## 2020-02-07 DIAGNOSIS — Z20822 Contact with and (suspected) exposure to covid-19: Secondary | ICD-10-CM

## 2020-02-07 NOTE — Progress Notes (Signed)
   LMP 03/05/2018    Subjective:    Patient ID: Beth Sanford, female    DOB: [birth year], 50 y.o.   MRN: [id number]  HPI: Beth Sanford is a 50 y.o. female presenting on 02/07/2020 for No chief complaint on file.   HPI   This is a telemedicine appointment due to coronavirus pandemic.  It is via telephone at pt was unable to get connected hrough [employer] today.  I connected with  Beth Sanford on 02/07/20 by a video enabled telemedicine application and verified that I am speaking with the correct person using two identifiers.   I discussed the limitations of evaluation and management by telemedicine. The patient expressed understanding and agreed to proceed.  Pt is at home.  Provider is at office.      Pt called today for appointment due to she is still sick.  Pt was in office on 01/29/20 to get a flu shot but she was having congestion so the flu shot was not given.  Pt says she has just continued to get more sick since that time.  She says she had a negative covid test at the [facility] site a week ago but she says she is congested and feels terrible and has trouble with her breathing.       Relevant past medical, surgical, family and social history reviewed and updated as indicated. Interim medical history since our last visit reviewed. Allergies and medications reviewed and updated.  Review of Systems  Per HPI unless specifically indicated above     Objective:    LMP 03/05/2018   Wt Readings from Last 3 Encounters:  01/14/20 169 lb (76.7 kg)  12/08/19 170 lb (77.1 kg)  11/19/19 167 lb (75.8 kg)    Physical Exam  This pt is known for years and she is sob today and sounds quite unlike herself.  She is not able to speak in full sentences and stops to catch her breath.        Assessment & Plan:    Encounter Diagnoses  Name Primary?  . SOB (shortness of breath) Yes  . Suspected COVID-19 virus infection   . Sick       Pt is encouraged to go to ER now for evaluation and  treatment.  Discussed that she needs to be checked due to her having sob.  She agrees to go to ER

## 2020-02-08 NOTE — Progress Notes (Signed)
CARDIOLOGY CONSULT NOTE       Patient ID: Beth Sanford MRN: [id number] DOB/AGE: 1970-07-29 50 y.o.  Admit date: (Not on file) Referring Physician: Katherene Ponto Primary Physician: Jacquelin Hawking, PA-C Primary Cardiologist: New Reason for Consultation: Chest Pain  Active Problems:   * No active hospital problems. *   HPI:  50 y.o. referred by Jacquelin Hawking for chest pain. Has had congestion and dyspnea Told to go to  ER for COVID testing but don't see that this was done Seen in their office 01/14/20 History of HTN DM and HLD. Complained of chest pain She has had a lot of stress Has not had her medications refilled Her pain was bad For 48 hours prior to office visit but not exertional She has 4 negative COVID tests in chart most recently 10/15/19 She had TTE done 09/11/19 with hyperdynamic EF 70% no valve disease Previous ECG;s have shown NSR with poor R wave progression LAD   She is still smoking and clinically has chronic bronchitis Her pain is atypical and non exertional Related to anxiety She is waiting on SS disability. Was homeless and then her mother helped her and she is working at [employer]  Has GERD and a lot of her pain seems GI. On omeprazole now Tums/Maalox help pain at times Had EGD 05/30/19 With esophagitis and erosions    ROS All other systems reviewed and negative except as noted above  Past Medical History:  Diagnosis Date  . Anxiety   . CHF (congestive heart failure) (HCC)   . Depression   . High cholesterol   . Hypertension   . OSA (obstructive sleep apnea) 09/13/2014    Family History  Problem Relation Age of Onset  . Alcohol abuse Father   . Heart failure Father        deceased at age 85  . Hypertension Brother   . Bipolar disorder Brother   . Emphysema Maternal Grandfather   . Colon cancer Neg Hx     Social History   Socioeconomic History  . Marital status: Single    Spouse name: Not on file  . Number of children: Not on file  . Years of  education: Not on file  . Highest education level: Not on file  Occupational History  . Not on file  Tobacco Use  . Smoking status: Current Some Day Smoker    Packs/day: 0.00    Years: 33.00    Pack years: 0.00    Types: Cigarettes    Start date: 01/05/1984  . Smokeless tobacco: Never Used  . Tobacco comment: 2-3 cig week  Vaping Use  . Vaping Use: Never used  Substance and Sexual Activity  . Alcohol use: No    Alcohol/week: 0.0 standard drinks  . Drug use: No  . Sexual activity: Not Currently    Birth control/protection: None  Other Topics Concern  . Not on file  Social History Narrative  . Not on file   Social Determinants of Health   Financial Resource Strain: Not on file  Food Insecurity: Not on file  Transportation Needs: Not on file  Physical Activity: Not on file  Stress: Not on file  Social Connections: Not on file  Intimate Partner Violence: Not on file    Past Surgical History:  Procedure Laterality Date  . BIOPSY  05/30/2019   Procedure: BIOPSY;  Surgeon: Malissa Hippo, MD;  Location: [facility];  Service: Endoscopy;;  esophagus  . CHOLECYSTECTOMY    .  ELBOW SURGERY  left  . ESOPHAGOGASTRODUODENOSCOPY N/A 05/30/2019   Procedure: ESOPHAGOGASTRODUODENOSCOPY (EGD);  Surgeon: Malissa Hippo, MD;  Location: [facility];  Service: Endoscopy;  Laterality: N/A;  150      Current Outpatient Medications:  .  atorvastatin (LIPITOR) 40 MG tablet, Take 1 tablet (40 mg total) by mouth daily., Disp: 30 tablet, Rfl: 0 .  carvedilol (COREG) 6.25 MG tablet, Take 1 tablet (6.25 mg total) by mouth 2 (two) times daily with a meal., Disp: 60 tablet, Rfl: 0 .  colchicine 0.6 MG tablet, Take 1 tablet (0.6 mg total) by mouth daily., Disp: 10 tablet, Rfl: 0 .  irbesartan (AVAPRO) 300 MG tablet, Take 1 tablet (300 mg total) by mouth daily., Disp: 30 tablet, Rfl: 1 .  losartan (COZAAR) 100 MG tablet, Take 1 tablet (100 mg total) by mouth daily., Disp: 30 tablet, Rfl: 0 .   metFORMIN (GLUCOPHAGE XR) 500 MG 24 hr tablet, Take 1 tablet (500 mg total) by mouth daily with breakfast., Disp: 30 tablet, Rfl: 0 .  nitroGLYCERIN (NITROSTAT) 0.4 MG SL tablet, Place 1 tablet (0.4 mg total) under the tongue every 5 (five) minutes as needed for chest pain., Disp: 25 tablet, Rfl: 3 .  omeprazole (PRILOSEC) 40 MG capsule, TAKE 1 Capsule BY MOUTH ONCE EVERY DAY, Disp: 30 capsule, Rfl: 0    Physical Exam: Blood pressure 140/90, pulse 91, height 5' 2.5" (1.588 m), weight 74.6 kg, last menstrual period 03/05/2018, SpO2 98 %.    Affect appropriate Healthy:  appears stated age HEENT: normal Neck supple with no adenopathy JVP normal no bruits no thyromegaly Lungs clear with no wheezing and good diaphragmatic motion Heart:  S1/S2 no murmur, no rub, gallop or click PMI normal Abdomen: benighn, BS positve, no tenderness, no AAA no bruit.  No HSM or HJR Distal pulses intact with no bruits No edema Neuro non-focal Skin warm and dry No muscular weakness   Labs:   Lab Results  Component Value Date   WBC 8.5 09/24/2019   HGB 13.6 09/24/2019   HCT 42.7 09/24/2019   MCV 87.7 09/24/2019   PLT 358 09/24/2019   No results for input(s): NA, K, CL, CO2, BUN, CREATININE, CALCIUM, PROT, BILITOT, ALKPHOS, ALT, AST, GLUCOSE in the last 168 hours.  Invalid input(s): LABALBU Lab Results  Component Value Date   TROPONINI <0.03 10/03/2017    Lab Results  Component Value Date   CHOL 207 (H) 09/24/2019   CHOL 252 (H) 10/16/2018   CHOL 172 07/17/2018   Lab Results  Component Value Date   HDL 48 09/24/2019   HDL 73 10/16/2018   HDL 55 07/17/2018   Lab Results  Component Value Date   LDLCALC 100 (H) 09/24/2019   LDLCALC 145 (H) 10/16/2018   LDLCALC 68 07/17/2018   Lab Results  Component Value Date   TRIG 296 (H) 09/24/2019   TRIG 172 (H) 10/16/2018   TRIG 247 (H) 07/17/2018   Lab Results  Component Value Date   CHOLHDL 4.3 09/24/2019   CHOLHDL 3.5 10/16/2018    CHOLHDL 3.1 07/17/2018   No results found for: LDLDIRECT    Radiology: No results found.  EKG: See HPI    ASSESSMENT AND PLAN:   1. Chest Pain: atypical CRFls ECG poor R wave progression likely from body habitus f/u Lexiscan myovue 2. HLD:  Continue statin labs with primary  3. HTN:  Well controlled.  Continue current medications and low sodium Dash type diet.   4.  DM:  Discussed low carb diet.  Target hemoglobin A1c is 6.5 or less.  Continue current medications. ' 5. Esophagitis: contributes to chest pains now on omeprazole  6. Smoking: with chronic bronchitic voice and likely bouts of URI's/ sinus infections counseled on cessation < 10 minutes CXR 09/10/19 NAD   Lexiscan Myovue F/U cardiology PRN   Signed: Charlton Haws 02/18/2020, 3:45 PM

## 2020-02-14 ENCOUNTER — Ambulatory Visit: Payer: Self-pay | Admitting: Physician Assistant

## 2020-02-18 ENCOUNTER — Other Ambulatory Visit: Payer: Self-pay

## 2020-02-18 ENCOUNTER — Ambulatory Visit (INDEPENDENT_AMBULATORY_CARE_PROVIDER_SITE_OTHER): Payer: Medicaid Other | Admitting: Cardiovascular Disease

## 2020-02-18 ENCOUNTER — Other Ambulatory Visit (HOSPITAL_COMMUNITY)
Admission: RE | Admit: 2020-02-18 | Discharge: 2020-02-18 | Disposition: A | Discharge status: Home / Self Care | Payer: Medicaid Other | Source: Ambulatory Visit | Attending: Physician Assistant | Admitting: Physician Assistant

## 2020-02-18 ENCOUNTER — Encounter: Payer: Self-pay | Admitting: Cardiovascular Disease

## 2020-02-18 ENCOUNTER — Encounter: Payer: Self-pay | Admitting: *Deleted

## 2020-02-18 DIAGNOSIS — R079 Chest pain, unspecified: Secondary | ICD-10-CM | POA: Diagnosis not present

## 2020-02-18 DIAGNOSIS — E785 Hyperlipidemia, unspecified: Secondary | ICD-10-CM | POA: Insufficient documentation

## 2020-02-18 DIAGNOSIS — E119 Type 2 diabetes mellitus without complications: Secondary | ICD-10-CM | POA: Diagnosis present

## 2020-02-18 DIAGNOSIS — I10 Essential (primary) hypertension: Secondary | ICD-10-CM | POA: Insufficient documentation

## 2020-02-18 DIAGNOSIS — R69 Illness, unspecified: Secondary | ICD-10-CM | POA: Diagnosis present

## 2020-02-18 LAB — COMPREHENSIVE METABOLIC PANEL
ALT: 28 U/L (ref 0–44)
AST: 27 U/L (ref 15–41)
Albumin: 4.2 g/dL (ref 3.5–5.0)
Alkaline Phosphatase: 66 U/L (ref 38–126)
Anion gap: 11 (ref 5–15)
BUN: 9 mg/dL (ref 6–20)
CO2: 21 mmol/L — ABNORMAL LOW (ref 22–32)
Calcium: 9.2 mg/dL (ref 8.9–10.3)
Chloride: 103 mmol/L (ref 98–111)
Creatinine, Ser: 0.8 mg/dL (ref 0.44–1.00)
GFR, Estimated: >60 mL/min (ref >60)
Glucose, Bld: 146 mg/dL — ABNORMAL HIGH (ref 70–99)
Potassium: 3.9 mmol/L (ref 3.5–5.1)
Sodium: 135 mmol/L (ref 135–145)
Total Bilirubin: 0.8 mg/dL (ref 0.3–1.2)
Total Protein: 7.6 g/dL (ref 6.5–8.1)

## 2020-02-18 LAB — LIPID PANEL
Cholesterol: 209 mg/dL — ABNORMAL HIGH (ref 0–200)
HDL: 70 mg/dL (ref >40)
LDL Cholesterol: 103 mg/dL — ABNORMAL HIGH (ref 0–99)
Total CHOL/HDL Ratio: 3 RATIO
Triglycerides: 178 mg/dL — ABNORMAL HIGH (ref <150)
VLDL: 36 mg/dL (ref 0–40)

## 2020-02-18 NOTE — Patient Instructions (Signed)
Medication Instructions:  Your physician recommends that you continue on your current medications as directed. Please refer to the Current Medication list given to you today.  *If you need a refill on your cardiac medications before your next appointment, please [facility] your pharmacy*   Lab Work: NONE  If you have labs (blood work) drawn today and your tests are completely normal, you will receive your results only by: . MyChart Message (if you have MyChart) OR . A paper copy in the mail If you have any lab test that is abnormal or we need to change your treatment, we will [place] you to review the results.   Testing/Procedures: Your physician has requested that you have a lexiscan myoview. For further information please visit [email or web address]. Please follow instruction sheet, as given.  Follow-Up: At [facility], you and your health needs are our priority.  As part of our continuing mission to provide you with exceptional heart care, we have created designated Provider Care Teams.  These Care Teams include your primary Cardiologist (physician) and Advanced Practice Providers (APPs -  Physician Assistants and Nurse Practitioners) who all work together to provide you with the care you need, when you need it.  We recommend signing up for the patient portal called "MyChart".  Sign up information is provided on this After Visit Summary.  MyChart is used to connect with patients for Virtual Visits (Telemedicine).  Patients are able to view lab/test results, encounter notes, upcoming appointments, etc.  Non-urgent messages can be sent to your provider as well.   To learn more about what you can do with MyChart, go to [email or web address].    Your next appointment:    As Needed   The format for your next appointment:   In Person  Provider:   Peter Nishan, MD   Other Instructions Thank you for choosing [facility]!    

## 2020-02-19 LAB — MICROALBUMIN, URINE: Microalb, Ur: 21.3 ug/mL — ABNORMAL HIGH

## 2020-02-19 LAB — HEMOGLOBIN A1C
Hgb A1c MFr Bld: 6.3 % — ABNORMAL HIGH (ref 4.8–5.6)
Mean Plasma Glucose: 134 mg/dL

## 2020-02-21 ENCOUNTER — Encounter (HOSPITAL_COMMUNITY)
Admission: RE | Admit: 2020-02-21 | Discharge: 2020-02-21 | Disposition: A | Discharge status: Home / Self Care | Payer: Medicaid Other | Source: Ambulatory Visit | Attending: Cardiovascular Disease | Admitting: Cardiovascular Disease

## 2020-02-21 ENCOUNTER — Other Ambulatory Visit: Payer: Self-pay

## 2020-02-21 ENCOUNTER — Ambulatory Visit (HOSPITAL_COMMUNITY)
Admission: RE | Admit: 2020-02-21 | Discharge: 2020-02-21 | Disposition: A | Discharge status: Home / Self Care | Payer: Medicaid Other | Source: Ambulatory Visit | Attending: Cardiovascular Disease | Admitting: Cardiovascular Disease

## 2020-02-21 DIAGNOSIS — R079 Chest pain, unspecified: Secondary | ICD-10-CM | POA: Diagnosis not present

## 2020-02-21 LAB — NM MYOCAR MULTI W/SPECT W/WALL MOTION / EF
LV dias vol: 67 mL (ref 46–106)
LV sys vol: 26 mL
Peak HR: 76 {beats}/min
RATE: 0.51
Rest HR: 59 {beats}/min
SDS: 4
SRS: 1
SSS: 5
TID: 1.22

## 2020-02-21 MED ORDER — REGADENOSON 0.4 MG/5ML IV SOLN
INTRAVENOUS | Status: AC
  Administered 2020-02-21: 0.4 mg via INTRAVENOUS
  Filled 2020-02-21: qty 5

## 2020-02-21 MED ORDER — TECHNETIUM TC 99M TETROFOSMIN IV KIT
30.0000 | PACK | Freq: Once | INTRAVENOUS | Status: AC | PRN
  Administered 2020-02-21: 32 via INTRAVENOUS

## 2020-02-21 MED ORDER — TECHNETIUM TC 99M TETROFOSMIN IV KIT
10.0000 | PACK | Freq: Once | INTRAVENOUS | Status: AC | PRN
  Administered 2020-02-21: 9.1 via INTRAVENOUS

## 2020-02-21 MED ORDER — SODIUM CHLORIDE FLUSH 0.9 % IV SOLN
INTRAVENOUS | Status: AC
  Administered 2020-02-21: 10 mL via INTRAVENOUS
  Filled 2020-02-21: qty 10

## 2020-02-22 ENCOUNTER — Encounter (HOSPITAL_COMMUNITY): Payer: Self-pay

## 2020-02-22 ENCOUNTER — Other Ambulatory Visit (HOSPITAL_COMMUNITY): Payer: Self-pay

## 2020-02-25 ENCOUNTER — Encounter: Payer: Self-pay | Admitting: Physician Assistant

## 2020-04-07 ENCOUNTER — Encounter (HOSPITAL_COMMUNITY): Payer: Self-pay

## 2020-04-07 ENCOUNTER — Emergency Department (HOSPITAL_COMMUNITY): Payer: Medicaid Other

## 2020-04-07 ENCOUNTER — Emergency Department (HOSPITAL_COMMUNITY)
Admission: EM | Admit: 2020-04-07 | Discharge: 2020-04-07 | Discharge status: Against Medical Advice | Payer: Medicaid Other | Attending: Emergency Medicine | Admitting: Emergency Medicine

## 2020-04-07 ENCOUNTER — Other Ambulatory Visit: Payer: Self-pay

## 2020-04-07 DIAGNOSIS — Z20822 Contact with and (suspected) exposure to covid-19: Secondary | ICD-10-CM | POA: Diagnosis not present

## 2020-04-07 DIAGNOSIS — I110 Hypertensive heart disease with heart failure: Secondary | ICD-10-CM | POA: Diagnosis not present

## 2020-04-07 DIAGNOSIS — J3489 Other specified disorders of nose and nasal sinuses: Secondary | ICD-10-CM | POA: Insufficient documentation

## 2020-04-07 DIAGNOSIS — Z79899 Other long term (current) drug therapy: Secondary | ICD-10-CM | POA: Diagnosis not present

## 2020-04-07 DIAGNOSIS — I509 Heart failure, unspecified: Secondary | ICD-10-CM | POA: Insufficient documentation

## 2020-04-07 DIAGNOSIS — R42 Dizziness and giddiness: Secondary | ICD-10-CM | POA: Diagnosis not present

## 2020-04-07 DIAGNOSIS — R059 Cough, unspecified: Secondary | ICD-10-CM

## 2020-04-07 DIAGNOSIS — Z7984 Long term (current) use of oral hypoglycemic drugs: Secondary | ICD-10-CM | POA: Diagnosis not present

## 2020-04-07 DIAGNOSIS — F17210 Nicotine dependence, cigarettes, uncomplicated: Secondary | ICD-10-CM | POA: Diagnosis not present

## 2020-04-07 DIAGNOSIS — R0981 Nasal congestion: Secondary | ICD-10-CM | POA: Insufficient documentation

## 2020-04-07 DIAGNOSIS — R072 Precordial pain: Secondary | ICD-10-CM | POA: Insufficient documentation

## 2020-04-07 DIAGNOSIS — R079 Chest pain, unspecified: Secondary | ICD-10-CM

## 2020-04-07 DIAGNOSIS — R1114 Bilious vomiting: Secondary | ICD-10-CM | POA: Insufficient documentation

## 2020-04-07 LAB — CBC
HCT: 46.5 % — ABNORMAL HIGH (ref 36.0–46.0)
Hemoglobin: 15.1 g/dL — ABNORMAL HIGH (ref 12.0–15.0)
MCH: 31.1 pg (ref 26.0–34.0)
MCHC: 32.5 g/dL (ref 30.0–36.0)
MCV: 95.7 fL (ref 80.0–100.0)
Platelets: 287 10*3/uL (ref 150–400)
RBC: 4.86 MIL/uL (ref 3.87–5.11)
RDW: 14.6 % (ref 11.5–15.5)
WBC: 12 10*3/uL — ABNORMAL HIGH (ref 4.0–10.5)
nRBC: 0 % (ref 0.0–0.2)

## 2020-04-07 LAB — BASIC METABOLIC PANEL
Anion gap: 11 (ref 5–15)
BUN: 15 mg/dL (ref 6–20)
CO2: 24 mmol/L (ref 22–32)
Calcium: 8.8 mg/dL — ABNORMAL LOW (ref 8.9–10.3)
Chloride: 103 mmol/L (ref 98–111)
Creatinine, Ser: 0.84 mg/dL (ref 0.44–1.00)
GFR, Estimated: >60 mL/min (ref >60)
Glucose, Bld: 131 mg/dL — ABNORMAL HIGH (ref 70–99)
Potassium: 4 mmol/L (ref 3.5–5.1)
Sodium: 138 mmol/L (ref 135–145)

## 2020-04-07 LAB — RESP PANEL BY RT-PCR (FLU A&B, COVID) ARPGX2
Influenza A by PCR: NEGATIVE
Influenza B by PCR: NEGATIVE
SARS Coronavirus 2 by RT PCR: NEGATIVE

## 2020-04-07 LAB — POC SARS CORONAVIRUS 2 AG -  ED: SARS Coronavirus 2 Ag: NEGATIVE

## 2020-04-07 LAB — TROPONIN I (HIGH SENSITIVITY)
Troponin I (High Sensitivity): 12 ng/L (ref <18)
Troponin I (High Sensitivity): 12 ng/L (ref <18)

## 2020-04-07 MED ORDER — NITROGLYCERIN 0.4 MG SL SUBL
0.4000 mg | SUBLINGUAL_TABLET | Freq: Once | SUBLINGUAL | Status: AC
  Administered 2020-04-07: 0.4 mg via SUBLINGUAL
  Filled 2020-04-07: qty 1

## 2020-04-07 MED ORDER — IRBESARTAN 150 MG PO TABS
300.0000 mg | ORAL_TABLET | Freq: Every day | ORAL | Status: DC
  Administered 2020-04-07: 300 mg via ORAL
  Filled 2020-04-07: qty 2

## 2020-04-07 MED ORDER — ASPIRIN 81 MG PO CHEW
324.0000 mg | CHEWABLE_TABLET | Freq: Once | ORAL | Status: AC
  Administered 2020-04-07: 324 mg via ORAL
  Filled 2020-04-07: qty 4

## 2020-04-07 NOTE — ED Triage Notes (Signed)
PT reports cough, head and chest congestion x 1 week.  Denies fever.  Reports chest started feeling tight while at work today.  Pt was at work and said her "boss man" was very hard on her.  PT says she feels dizzy and feels like her bp is up.

## 2020-04-07 NOTE — ED Provider Notes (Signed)
[facility] EMERGENCY DEPARTMENT Provider Note   CSN: [id number] Arrival date & time: 04/07/20  6734     History Chief Complaint  Patient presents with  . Cough    Beth Sanford is a 50 y.o. female with a history of CHF, high cholesterol, hypertension, GERD, anxiety.  Patient presents with chief complaint of chest pain and dizziness.  Patient reports that her chest pain began approximately 0800 this morning while at work.  Patient reports chest pain is midsternal and does not radiate.  Patient describes her pain as a "twisting."  Patient rates her pain 8/10 on the pain scale.  Patient reports that she has had intermittent chest pain over the last 4 years.  Patient reports that this pain today is no different than what she has experienced in the past.  Patient reports that her dizziness began at the same time as her chest pain.  Patient reports that she felt like the room was spinning.  Patient reports that she was worried her blood pressure was high.  Patient reports that she did not take her blood pressure medication this morning.  States that she was under more stress than usual while at work.  Reports that her boss was "fussing at me."  Patient also endorses nasal congestion and cough x2 weeks.  Patient reports that her nasal congestion is slowly improving.  Reports cough is productive.  Patient also endorses sinus pressure.    Patient states that yesterday she was nauseous and had 2 episodes of bilious vomiting.  Patient denies any blood or coffee-ground emesis.  She denies any abdominal pain, diarrhea, or constipation.  Patient denies any fevers, chills, sore throat, loss of smell or taste, shortness of breath, leg swelling, GU symptoms, lightheadedness, syncope, slurred speech, facial asymmetry, seizures, focal neurological deficit.  LMP 3 months prior.  Patient is vaccinated for COVID-19 x2 no booster. HPI     Past Medical History:  Diagnosis Date  . Anxiety   . CHF (congestive  heart failure) (HCC)   . Depression   . High cholesterol   . Hypertension   . OSA (obstructive sleep apnea) 09/13/2014    Patient Active Problem List   Diagnosis Date Noted  . Gastroesophageal reflux disease   . Hypertensive urgency 09/10/2019  . Melena 05/21/2019  . Hypertension 05/21/2019  . Abnormal LFTs 09/16/2015  . Tobacco abuse 12/18/2014  . OSA (obstructive sleep apnea) 09/13/2014  . Major depression 06/08/2013  . Chest pain 11/01/2012  . Essential hypertension, malignant 11/01/2012  . Hyperlipidemia 11/01/2012    Past Surgical History:  Procedure Laterality Date  . BIOPSY  05/30/2019   Procedure: BIOPSY;  Surgeon: Malissa Hippo, MD;  Location: [facility];  Service: Endoscopy;;  esophagus  . CHOLECYSTECTOMY    . ELBOW SURGERY  left  . ESOPHAGOGASTRODUODENOSCOPY N/A 05/30/2019   Procedure: ESOPHAGOGASTRODUODENOSCOPY (EGD);  Surgeon: Malissa Hippo, MD;  Location: [facility];  Service: Endoscopy;  Laterality: N/A;  150     OB History    Gravida  3   Para  1   Term      Preterm      AB  2   Living        SAB      IAB      Ectopic      Multiple      Live Births              Family History  Problem Relation Age of  Onset  . Alcohol abuse Father   . Heart failure Father        deceased at age 90  . Hypertension Brother   . Bipolar disorder Brother   . Emphysema Maternal Grandfather   . Colon cancer Neg Hx     Social History   Tobacco Use  . Smoking status: Current Some Day Smoker    Packs/day: 0.00    Years: 33.00    Pack years: 0.00    Types: Cigarettes    Start date: 01/05/1984  . Smokeless tobacco: Never Used  . Tobacco comment: 2-3 cig week  Vaping Use  . Vaping Use: Never used  Substance Use Topics  . Alcohol use: No    Alcohol/week: 0.0 standard drinks  . Drug use: No    Home Medications Prior to Admission medications   Medication Sig Start Date End Date Taking? Authorizing Provider  atorvastatin (LIPITOR) 40 MG  tablet Take 1 tablet (40 mg total) by mouth daily. 01/14/20   Jacquelin Hawking, PA-C  carvedilol (COREG) 6.25 MG tablet Take 1 tablet (6.25 mg total) by mouth 2 (two) times daily with a meal. 01/14/20   Jacquelin Hawking, PA-C  colchicine 0.6 MG tablet Take 1 tablet (0.6 mg total) by mouth daily. 12/08/19   Devoria Albe, MD  irbesartan (AVAPRO) 300 MG tablet Take 1 tablet (300 mg total) by mouth daily. 01/24/20   Jacquelin Hawking, PA-C  losartan (COZAAR) 100 MG tablet Take 1 tablet (100 mg total) by mouth daily. 01/14/20   Jacquelin Hawking, PA-C  metFORMIN (GLUCOPHAGE XR) 500 MG 24 hr tablet Take 1 tablet (500 mg total) by mouth daily with breakfast. 01/14/20   Jacquelin Hawking, PA-C  nitroGLYCERIN (NITROSTAT) 0.4 MG SL tablet Place 1 tablet (0.4 mg total) under the tongue every 5 (five) minutes as needed for chest pain. 01/14/20   Jacquelin Hawking, PA-C  omeprazole (PRILOSEC) 40 MG capsule TAKE 1 Capsule BY MOUTH ONCE EVERY DAY 01/14/20   Jacquelin Hawking, PA-C    Allergies    Codeine and Tramadol  Review of Systems   Review of Systems  Constitutional: Negative for chills and fever.  HENT: Positive for congestion, rhinorrhea and sinus pressure. Negative for drooling, sore throat and trouble swallowing.   Eyes: Negative for visual disturbance.  Respiratory: Positive for cough. Negative for shortness of breath.   Cardiovascular: Positive for chest pain. Negative for leg swelling.  Gastrointestinal: Negative for abdominal distention, abdominal pain, anal bleeding, blood in stool, constipation, diarrhea, nausea, rectal pain and vomiting.  Genitourinary: Negative for difficulty urinating, dysuria, frequency, hematuria, vaginal bleeding, vaginal discharge and vaginal pain.  Musculoskeletal: Negative for back pain and neck pain.  Skin: Negative for color change and rash.  Neurological: Positive for dizziness. Negative for tremors, seizures, syncope, facial asymmetry, speech difficulty, weakness,  light-headedness, numbness and headaches.  Psychiatric/Behavioral: Negative for confusion. The patient is nervous/anxious.     Physical Exam Updated Vital Signs BP (!) 162/120   Pulse 84   Temp 98.5 F (36.9 C) (Oral)   Resp (!) 22   Ht 5\' 2"  (1.575 m)   Wt 73.9 kg   LMP 03/05/2018   SpO2 97%   BMI 29.81 kg/m   Physical Exam Vitals and nursing note reviewed.  Constitutional:      General: She is not in acute distress.    Appearance: She is not ill-appearing, toxic-appearing or diaphoretic.  HENT:     Head: Normocephalic and atraumatic.     Jaw: No trismus  or pain on movement.     Nose:     Right Sinus: No maxillary sinus tenderness or frontal sinus tenderness.     Left Sinus: No maxillary sinus tenderness or frontal sinus tenderness.     Mouth/Throat:     Mouth: Mucous membranes are moist.     Pharynx: Oropharynx is clear. Uvula midline. No pharyngeal swelling, oropharyngeal exudate, posterior oropharyngeal erythema or uvula swelling.  Eyes:     General: No scleral icterus.       Right eye: No discharge.        Left eye: No discharge.     Extraocular Movements: Extraocular movements intact.     Pupils: Pupils are equal, round, and reactive to light.  Cardiovascular:     Rate and Rhythm: Normal rate.  Pulmonary:     Effort: Pulmonary effort is normal. No respiratory distress.     Breath sounds: Normal breath sounds. No stridor.  Chest:     Chest wall: No mass, lacerations, deformity, tenderness or crepitus.  Abdominal:     General: There is no distension. There are no signs of injury.     Palpations: Abdomen is soft. There is no mass.     Tenderness: There is no abdominal tenderness. There is no guarding or rebound.     Hernia: There is no hernia in the umbilical area or ventral area.  Musculoskeletal:     Cervical back: Normal range of motion and neck supple. No rigidity.     Right lower leg: No swelling, deformity, lacerations, tenderness or bony tenderness. No  edema.     Left lower leg: No swelling, deformity, lacerations, tenderness or bony tenderness. No edema.  Skin:    General: Skin is warm and dry.  Neurological:     General: No focal deficit present.     Mental Status: She is alert.     GCS: GCS eye subscore is 4. GCS verbal subscore is 5. GCS motor subscore is 6.     Cranial Nerves: No cranial nerve deficit or facial asymmetry.     Sensory: Sensation is intact.     Motor: No weakness, tremor, seizure activity or pronator drift.     Coordination: Finger-Nose-Finger Test normal.     Comments: CN II-XII intact; performed in supine position, +5 strength to bilateral upper extremities, grip strength equal, +5 strength to dorsiflexion and plantarflexion, patient able to left both legs against gravity and hold each there without difficulty   Psychiatric:        Mood and Affect: Mood is anxious.        Behavior: Behavior is cooperative.     ED Results / Procedures / Treatments   Labs (all labs ordered are listed, but only abnormal results are displayed) Labs Reviewed  BASIC METABOLIC PANEL - Abnormal; Notable for the following components:      Result Value   Glucose, Bld 131 (*)    Calcium 8.8 (*)    All other components within normal limits  CBC - Abnormal; Notable for the following components:   WBC 12.0 (*)    Hemoglobin 15.1 (*)    HCT 46.5 (*)    All other components within normal limits  RESP PANEL BY RT-PCR (FLU A&B, COVID) ARPGX2  POC SARS CORONAVIRUS 2 AG -  ED  TROPONIN I (HIGH SENSITIVITY)  TROPONIN I (HIGH SENSITIVITY)    EKG None  Radiology DG Chest Port 1 View  Result Date: 04/07/2020 CLINICAL DATA:  Chest pain Shortness  of breath Cough Elevated blood pressure EXAM: PORTABLE CHEST 1 VIEW COMPARISON:  09/10/2019 FINDINGS: The heart size and mediastinal contours are within normal limits. Both lungs are clear. The visualized skeletal structures are unremarkable. IMPRESSION: No active disease. Electronically Signed    By: Acquanetta BellingFarhaan  Mir M.D.   On: 04/07/2020 11:16    Procedures Procedures   Medications Ordered in ED Medications  nitroGLYCERIN (NITROSTAT) SL tablet 0.4 mg (has no administration in time range)  aspirin chewable tablet 324 mg (has no administration in time range)  irbesartan (AVAPRO) tablet 300 mg (has no administration in time range)    ED Course  I have reviewed the triage vital signs and the nursing notes.  Pertinent labs & imaging results that were available during my care of the patient were reviewed by me and considered in my medical decision making (see chart for details).    MDM Rules/Calculators/A&P                          Alert 50 year old female no acute distress, nontoxic appearing.  Patient presents with chief complaint of chest pain and dizziness.  Symptoms began 0800 this morning while patient was at work.  Patient reports that she was under increased stress while at work.  Patient reports that she has history of intermittent chest pain over the past 4 years.  Patient is seen by cardiologist Dr. Eden EmmsNishan.  Patient had stress test on 02/21/2020.   Patient also complains of nasal congestion and productive cough x2 weeks.  We will obtain chest x-ray, EKG, troponin x2, CMP, CBC, and Covid testing.  Patient given 324 mg aspirin and 1 sublingual nitro.  Patient noted to be hypertensive at 194/110.  Patient reports that she did not take her medications this morning.  Patient's home valsartan ordered.  CBC shows slight leukocytosis at 12.0.  Low suspicion for pneumonia as chest x-ray shows no consolidation and lungs clear to auscultation bilaterally. BMP unremarkable. COVID-19 antigen test negative.  Will obtain COVID-19 PCR.  If patient's COVID-19 test negative will consider antibiotics for acute rhinosinusitis as patient has had nasal congestion and sinus pressure x2 weeks.  Chest x-ray shows no active cardiopulmonary disease.  Initial troponin 12.  EKG shows sinus rhythm with no  ST elevation or depression.  On reexamination patient reports complete resolution of her pain.  Patient continues to be in no acute distress.  And hemodynamically stable.  Advised that by RN that patient left AGAINST MEDICAL ADVICE prior to second troponin being resulted.  This provider did not have an opportunity to speak with patient before she left.  Final Clinical Impression(s) / ED Diagnoses Final diagnoses:  Cough  Chest pain    Rx / DC Orders ED Discharge Orders    None       Berneice HeinrichBadalamente, Yomayra Tate R, PA-C 04/07/20 2116    Bethann BerkshireZammit, Joseph, MD 04/09/20 [phone]

## 2020-10-22 ENCOUNTER — Encounter: Payer: Self-pay | Admitting: Orthopedic Surgery

## 2020-10-30 ENCOUNTER — Other Ambulatory Visit: Payer: Self-pay

## 2020-10-30 ENCOUNTER — Ambulatory Visit (HOSPITAL_COMMUNITY)
Admission: RE | Admit: 2020-10-30 | Discharge: 2020-10-30 | Disposition: A | Discharge status: Home / Self Care | Payer: Medicaid Other | Source: Ambulatory Visit | Attending: Internal Medicine | Admitting: Internal Medicine

## 2020-10-30 ENCOUNTER — Other Ambulatory Visit (HOSPITAL_COMMUNITY): Payer: Self-pay | Admitting: Internal Medicine

## 2020-10-30 DIAGNOSIS — M25522 Pain in left elbow: Secondary | ICD-10-CM | POA: Diagnosis present

## 2020-11-11 ENCOUNTER — Other Ambulatory Visit: Payer: Self-pay

## 2020-11-11 ENCOUNTER — Encounter: Payer: Self-pay | Admitting: Orthopaedic Surgery

## 2020-11-11 ENCOUNTER — Ambulatory Visit (INDEPENDENT_AMBULATORY_CARE_PROVIDER_SITE_OTHER): Payer: Medicaid Other | Admitting: Orthopaedic Surgery

## 2020-11-11 VITALS — BP 180/115 | HR 83 | Ht 62.5 in | Wt 164.0 lb

## 2020-11-11 DIAGNOSIS — M7022 Olecranon bursitis, left elbow: Secondary | ICD-10-CM | POA: Diagnosis not present

## 2020-11-11 DIAGNOSIS — M10022 Idiopathic gout, left elbow: Secondary | ICD-10-CM | POA: Diagnosis not present

## 2020-11-11 MED ORDER — ALLOPURINOL 300 MG PO TABS: 300.0000 mg | ORAL_TABLET | Freq: Every day | ORAL | 5 refills | Status: DC

## 2020-11-11 NOTE — Progress Notes (Signed)
Subjective:    Patient ID: Beth Sanford, female    DOB: 25-Dec-1970, 50 y.o.   MRN: [id number]  HPI She has had pain and swelling in the left elbow for about six weeks or so.  She has no trauma.  She has been seen and had X-rays of the elbow on 11-01-20.  She says it bothers her more in the evening hours.  She has no redness, no insect bites.  She says she has gout of the foot and is on Naprosyn for that but is not taking it now.    I have independently reviewed and interpreted x-rays of this patient done at another site by another physician or qualified health professional.  She has no numbness.    Review of Systems  Constitutional:  Positive for activity change.  Musculoskeletal:  Positive for arthralgias and joint swelling.  All other systems reviewed and are negative. For Review of Systems, all other systems reviewed and are negative.  The following is a summary of the past history medically, past history surgically, known current medicines, social history and family history.  This information is gathered electronically by the computer from prior information and documentation.  I review this each visit and have found including this information at this point in the chart is beneficial and informative.   Past Medical History:  Diagnosis Date   Anxiety    CHF (congestive heart failure) (HCC)    Depression    High cholesterol    Hypertension    OSA (obstructive sleep apnea) 09/13/2014    Past Surgical History:  Procedure Laterality Date   BIOPSY  05/30/2019   Procedure: BIOPSY;  Surgeon: Malissa Hippo, MD;  Location: [facility];  Service: Endoscopy;;  esophagus   CHOLECYSTECTOMY     ELBOW SURGERY  left   ESOPHAGOGASTRODUODENOSCOPY N/A 05/30/2019   Procedure: ESOPHAGOGASTRODUODENOSCOPY (EGD);  Surgeon: Malissa Hippo, MD;  Location: [facility];  Service: Endoscopy;  Laterality: N/A;  150    Current Outpatient Medications on File Prior to Visit  Medication Sig Dispense  Refill   atorvastatin (LIPITOR) 40 MG tablet Take 1 tablet (40 mg total) by mouth daily. 30 tablet 0   carvedilol (COREG) 6.25 MG tablet Take 1 tablet (6.25 mg total) by mouth 2 (two) times daily with a meal. 60 tablet 0   colchicine 0.6 MG tablet Take 1 tablet (0.6 mg total) by mouth daily. 10 tablet 0   irbesartan (AVAPRO) 300 MG tablet Take 1 tablet (300 mg total) by mouth daily. 30 tablet 1   losartan (COZAAR) 100 MG tablet Take 1 tablet (100 mg total) by mouth daily. 30 tablet 0   metFORMIN (GLUCOPHAGE XR) 500 MG 24 hr tablet Take 1 tablet (500 mg total) by mouth daily with breakfast. 30 tablet 0   nitroGLYCERIN (NITROSTAT) 0.4 MG SL tablet Place 1 tablet (0.4 mg total) under the tongue every 5 (five) minutes as needed for chest pain. 25 tablet 3   omeprazole (PRILOSEC) 40 MG capsule TAKE 1 Capsule BY MOUTH ONCE EVERY DAY 30 capsule 0   No current facility-administered medications on file prior to visit.    Social History   Socioeconomic History   Marital status: Single    Spouse name: Not on file   Number of children: Not on file   Years of education: Not on file   Highest education level: Not on file  Occupational History   Not on file  Tobacco Use   Smoking  status: Some Days    Packs/day: 0.00    Years: 33.00    Pack years: 0.00    Types: Cigarettes    Start date: 01/05/1984   Smokeless tobacco: Never   Tobacco comments:    2-3 cig week  Vaping Use   Vaping Use: Never used  Substance and Sexual Activity   Alcohol use: No    Alcohol/week: 0.0 standard drinks   Drug use: No   Sexual activity: Not Currently    Birth control/protection: None  Other Topics Concern   Not on file  Social History Narrative   Not on file   Social Determinants of Health   Financial Resource Strain: Not on file  Food Insecurity: Not on file  Transportation Needs: Not on file  Physical Activity: Not on file  Stress: Not on file  Social Connections: Not on file  Intimate Partner  Violence: Not on file    Family History  Problem Relation Age of Onset   Alcohol abuse Father    Heart failure Father        deceased at age 43   Hypertension Brother    Bipolar disorder Brother    Emphysema Maternal Grandfather    Colon cancer Neg Hx     BP (!) 180/115   Pulse 83   Ht 5' 2.5" (1.588 m)   Wt 164 lb (74.4 kg)   LMP 03/05/2018   BMI 29.52 kg/m   Body mass index is 29.52 kg/m.     Objective:   Physical Exam Vitals and nursing note reviewed. Exam conducted with a chaperone present.  Constitutional:      Appearance: She is well-developed.  HENT:     Head: Normocephalic and atraumatic.  Eyes:     Conjunctiva/sclera: Conjunctivae normal.     Pupils: Pupils are equal, round, and reactive to light.  Cardiovascular:     Rate and Rhythm: Normal rate and regular rhythm.  Pulmonary:     Effort: Pulmonary effort is normal.  Abdominal:     Palpations: Abdomen is soft.  Musculoskeletal:       Arms:     Cervical back: Normal range of motion and neck supple.  Skin:    General: Skin is warm and dry.  Neurological:     Mental Status: She is alert and oriented to person, place, and time.     Cranial Nerves: No cranial nerve deficit.     Motor: No abnormal muscle tone.     Coordination: Coordination normal.     Deep Tendon Reflexes: Reflexes are normal and symmetric. Reflexes normal.  Psychiatric:        Behavior: Behavior normal.        Thought Content: Thought content normal.        Judgment: Judgment normal.          Assessment & Plan:   Encounter Diagnoses  Name Primary?   Olecranon bursitis, left elbow Yes   Acute idiopathic gout of left elbow     Procedure note: After permission from the patient and prep of the left elbow, I aspirated about 3 to 4 cc of blood tinged fluid from the elbow and instilled 1 cc DepoMedrol 40 by sterile technique tolerated well.  She is highly allergic to codeine and Tramadol.  I told her I could not give  these.  I will get serum uric acid level.  I will begin allopurinol.  Return in one week.  [place] if any problem.  Precautions discussed.  Electronically Signed Darreld Mclean, MD 11/8/20222:37 PM

## 2020-11-18 ENCOUNTER — Other Ambulatory Visit: Payer: Self-pay

## 2020-11-18 ENCOUNTER — Other Ambulatory Visit (HOSPITAL_COMMUNITY)
Admission: RE | Admit: 2020-11-18 | Discharge: 2020-11-18 | Disposition: A | Discharge status: Home / Self Care | Payer: Medicaid Other | Source: Ambulatory Visit | Attending: Orthopaedic Surgery | Admitting: Orthopaedic Surgery

## 2020-11-18 ENCOUNTER — Ambulatory Visit (INDEPENDENT_AMBULATORY_CARE_PROVIDER_SITE_OTHER): Payer: Medicaid Other | Admitting: Orthopaedic Surgery

## 2020-11-18 ENCOUNTER — Encounter: Payer: Self-pay | Admitting: Orthopaedic Surgery

## 2020-11-18 VITALS — BP 218/133 | HR 90 | Ht 62.5 in | Wt 158.2 lb

## 2020-11-18 DIAGNOSIS — M7022 Olecranon bursitis, left elbow: Secondary | ICD-10-CM | POA: Insufficient documentation

## 2020-11-18 DIAGNOSIS — M10022 Idiopathic gout, left elbow: Secondary | ICD-10-CM | POA: Insufficient documentation

## 2020-11-18 LAB — URIC ACID: Uric Acid, Serum: 6.5 mg/dL (ref 2.5–7.1)

## 2020-11-18 MED ORDER — PREDNISONE 5 MG (21) PO TBPK: ORAL_TABLET | ORAL | 0 refills | Status: DC

## 2020-11-18 NOTE — Progress Notes (Signed)
I am only a little better.  She started on the allopurinol but did not get the serum uric acid level drawn.  She has pain in the left elbow and the foot on the right.  She says the elbow hurts but not as much.  She has no increased swelling.  Left olecranon bursa is not as large as last time.  NV intact. ROM is full.  Right great toe MTP is tender but not red.  NV intact.  Limp right.  Encounter Diagnoses  Name Primary?   Olecranon bursitis, left elbow Yes   Acute idiopathic gout of left elbow    I will give prednisone dose pack.  Get serum uric acid done.  Return in one week.  [place] if any problem.  Precautions discussed.  Electronically Signed Darreld Mclean, MD 11/15/20222:53 PM

## 2020-11-20 ENCOUNTER — Ambulatory Visit: Payer: Self-pay | Admitting: Podiatry

## 2020-11-24 ENCOUNTER — Encounter: Payer: Self-pay | Admitting: Radiology

## 2020-11-25 ENCOUNTER — Other Ambulatory Visit: Payer: Self-pay

## 2020-11-25 ENCOUNTER — Ambulatory Visit (INDEPENDENT_AMBULATORY_CARE_PROVIDER_SITE_OTHER): Payer: Medicaid Other | Admitting: Orthopaedic Surgery

## 2020-11-25 ENCOUNTER — Encounter: Payer: Self-pay | Admitting: Orthopaedic Surgery

## 2020-11-25 ENCOUNTER — Encounter (INDEPENDENT_AMBULATORY_CARE_PROVIDER_SITE_OTHER): Payer: Self-pay | Admitting: *Deleted

## 2020-11-25 ENCOUNTER — Ambulatory Visit: Payer: Medicaid Other | Admitting: Orthopaedic Surgery

## 2020-11-25 DIAGNOSIS — M7022 Olecranon bursitis, left elbow: Secondary | ICD-10-CM | POA: Diagnosis not present

## 2020-11-25 NOTE — Progress Notes (Signed)
I am much better.  She had uric acid done and it was 6.5.  She is taking the allopurinol.  She had a good response to the dose pack.  She has no swelling of the left olecranon today.  ROM of the left elbow is full, she has no swelling, no redness, NV intact.  Encounter Diagnosis  Name Primary?   Olecranon bursitis, left elbow Yes   Continue the allopurinol.    I will see as needed.  [place] if any problem.  Precautions discussed.  Electronically Signed Darreld Mclean, MD 11/22/202210:34 AM

## 2020-11-25 NOTE — Patient Instructions (Signed)
Continue Allopurinol daily 

## 2020-12-01 ENCOUNTER — Other Ambulatory Visit (HOSPITAL_COMMUNITY)
Admission: RE | Admit: 2020-12-01 | Discharge: 2020-12-01 | Disposition: A | Discharge status: Home / Self Care | Payer: Medicaid Other | Source: Ambulatory Visit | Attending: Orthopaedic Surgery | Admitting: Orthopaedic Surgery

## 2020-12-01 DIAGNOSIS — M7022 Olecranon bursitis, left elbow: Secondary | ICD-10-CM | POA: Diagnosis present

## 2020-12-01 DIAGNOSIS — M10022 Idiopathic gout, left elbow: Secondary | ICD-10-CM | POA: Insufficient documentation

## 2020-12-01 LAB — URIC ACID: Uric Acid, Serum: 7.2 mg/dL — ABNORMAL HIGH (ref 2.5–7.1)

## 2021-03-18 ENCOUNTER — Other Ambulatory Visit: Payer: Self-pay

## 2021-03-18 ENCOUNTER — Ambulatory Visit (HOSPITAL_COMMUNITY)
Admission: RE | Admit: 2021-03-18 | Discharge: 2021-03-18 | Disposition: A | Discharge status: Home / Self Care | Payer: Medicaid Other | Source: Ambulatory Visit | Attending: Nurse Practitioner | Admitting: Nurse Practitioner

## 2021-03-18 ENCOUNTER — Other Ambulatory Visit (HOSPITAL_COMMUNITY): Payer: Self-pay | Admitting: Nurse Practitioner

## 2021-03-18 DIAGNOSIS — M542 Cervicalgia: Secondary | ICD-10-CM | POA: Diagnosis present

## 2021-05-04 ENCOUNTER — Encounter (INDEPENDENT_AMBULATORY_CARE_PROVIDER_SITE_OTHER): Payer: Self-pay | Admitting: *Deleted

## 2021-05-31 IMAGING — DX DG CHEST 1V PORT
1 series · 1 of 1 positions shown · non-contrast
Comparison: 09/10/2019

CLINICAL DATA: Chest pain

Shortness of breath
Cough
Elevated blood pressure
EXAM:
PORTABLE CHEST 1 VIEW

[chest ap]
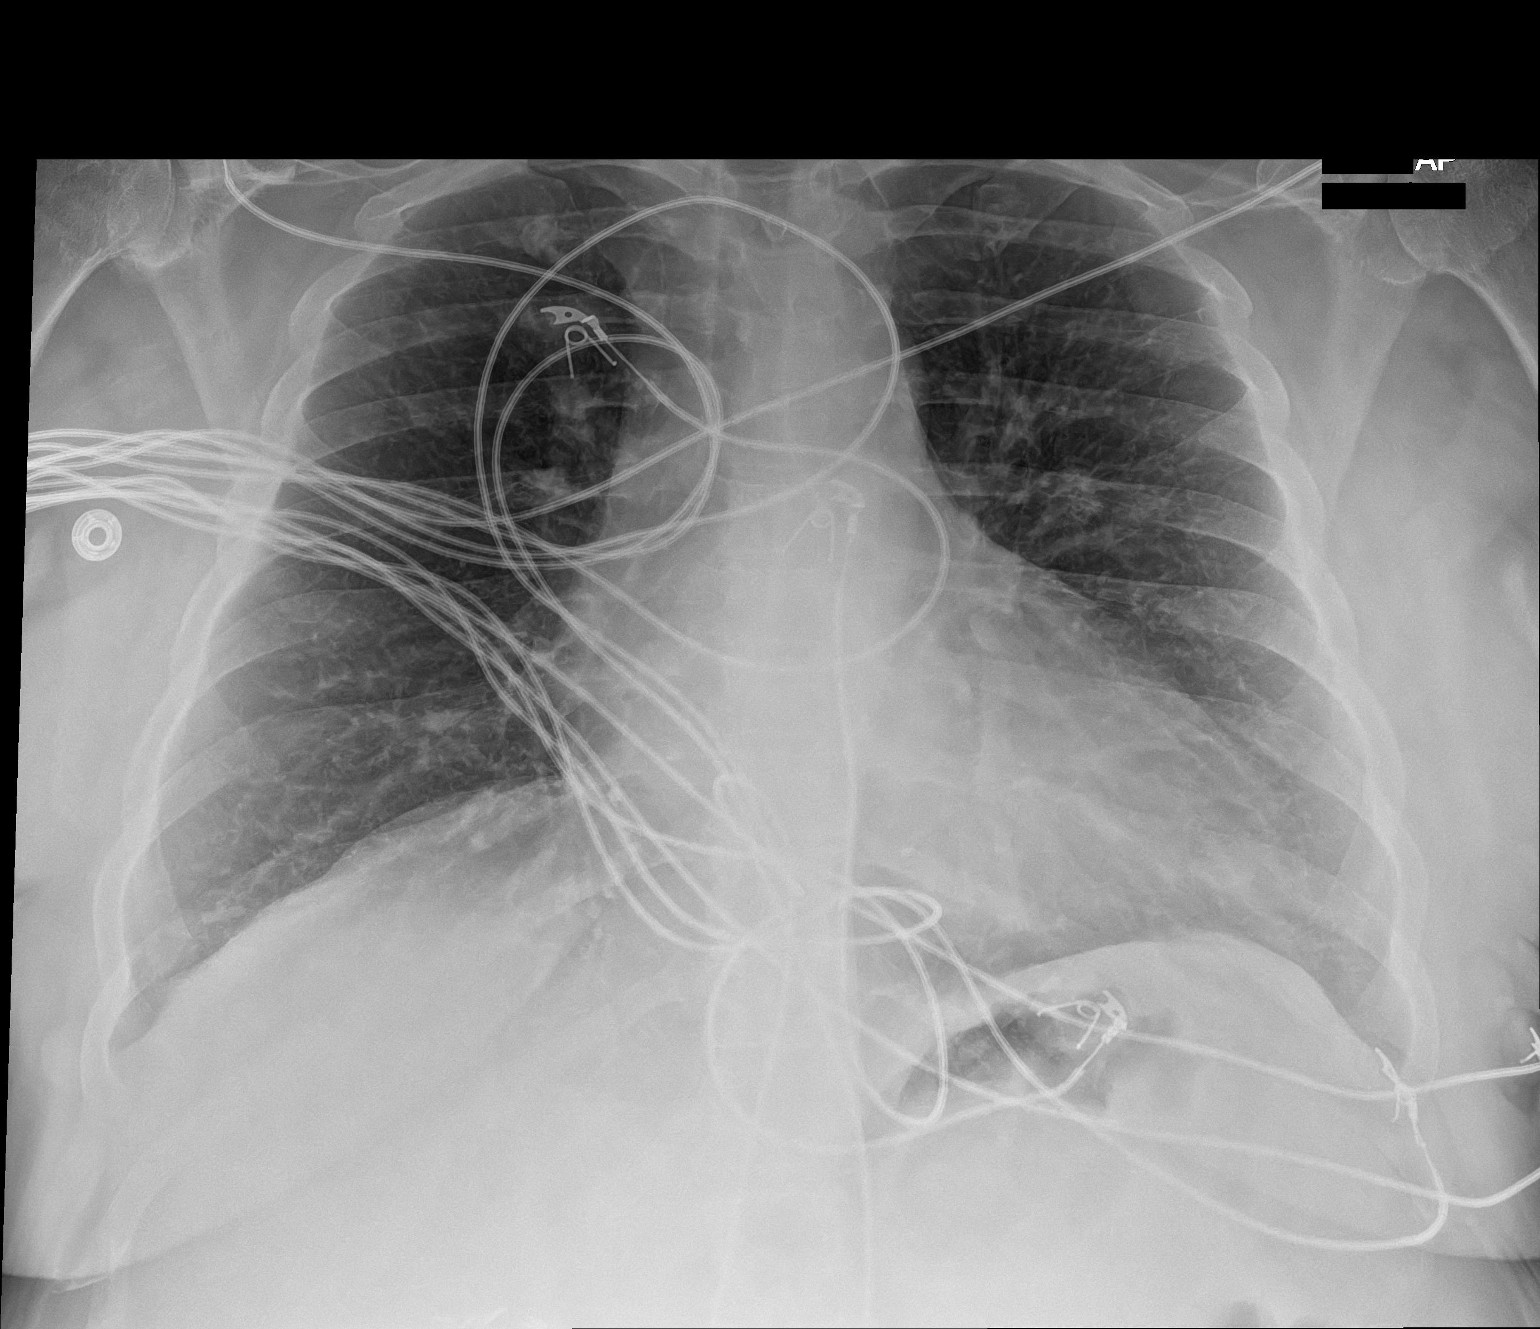

[1 of 1 positions shown; findings below may reference images not displayed]

FINDINGS: The heart size and mediastinal contours are within normal limits.
Both lungs are clear. The visualized skeletal structures are
unremarkable.
IMPRESSION: No active disease.

## 2021-08-13 ENCOUNTER — Other Ambulatory Visit: Payer: Self-pay | Admitting: Nurse Practitioner

## 2021-08-13 ENCOUNTER — Other Ambulatory Visit (HOSPITAL_COMMUNITY): Payer: Self-pay | Admitting: Nurse Practitioner

## 2021-08-13 DIAGNOSIS — F17210 Nicotine dependence, cigarettes, uncomplicated: Secondary | ICD-10-CM

## 2021-09-22 ENCOUNTER — Ambulatory Visit (HOSPITAL_COMMUNITY)
Admission: RE | Admit: 2021-09-22 | Discharge: 2021-09-22 | Disposition: A | Discharge status: Home / Self Care | Payer: Medicaid Other | Source: Ambulatory Visit | Attending: Nurse Practitioner | Admitting: Nurse Practitioner

## 2021-09-22 DIAGNOSIS — F17210 Nicotine dependence, cigarettes, uncomplicated: Secondary | ICD-10-CM | POA: Insufficient documentation

## 2021-12-07 ENCOUNTER — Encounter: Payer: Medicaid Other | Admitting: Obstetrics & Gynecology

## 2021-12-31 ENCOUNTER — Encounter: Payer: Medicaid Other | Admitting: Obstetrics & Gynecology

## 2022-03-04 ENCOUNTER — Encounter: Payer: Self-pay | Admitting: Radiology

## 2022-07-23 ENCOUNTER — Inpatient Hospital Stay (HOSPITAL_COMMUNITY)
Admission: EM | Admit: 2022-07-23 | Discharge: 2022-08-05 | DRG: 917 | Disposition: E | Payer: MEDICAID | Attending: Internal Medicine | Admitting: Internal Medicine

## 2022-07-23 DIAGNOSIS — Y900 Blood alcohol level of less than 20 mg/100 ml: Secondary | ICD-10-CM | POA: Diagnosis present

## 2022-07-23 DIAGNOSIS — E440 Moderate protein-calorie malnutrition: Secondary | ICD-10-CM | POA: Diagnosis present

## 2022-07-23 DIAGNOSIS — I468 Cardiac arrest due to other underlying condition: Secondary | ICD-10-CM | POA: Diagnosis present

## 2022-07-23 DIAGNOSIS — Z818 Family history of other mental and behavioral disorders: Secondary | ICD-10-CM

## 2022-07-23 DIAGNOSIS — I613 Nontraumatic intracerebral hemorrhage in brain stem: Secondary | ICD-10-CM | POA: Diagnosis present

## 2022-07-23 DIAGNOSIS — T405X1A Poisoning by cocaine, accidental (unintentional), initial encounter: Principal | ICD-10-CM | POA: Diagnosis present

## 2022-07-23 DIAGNOSIS — J9811 Atelectasis: Secondary | ICD-10-CM | POA: Diagnosis not present

## 2022-07-23 DIAGNOSIS — Z885 Allergy status to narcotic agent status: Secondary | ICD-10-CM

## 2022-07-23 DIAGNOSIS — I615 Nontraumatic intracerebral hemorrhage, intraventricular: Secondary | ICD-10-CM | POA: Diagnosis present

## 2022-07-23 DIAGNOSIS — E1165 Type 2 diabetes mellitus with hyperglycemia: Secondary | ICD-10-CM | POA: Diagnosis present

## 2022-07-23 DIAGNOSIS — F1310 Sedative, hypnotic or anxiolytic abuse, uncomplicated: Secondary | ICD-10-CM | POA: Diagnosis present

## 2022-07-23 DIAGNOSIS — E870 Hyperosmolality and hypernatremia: Secondary | ICD-10-CM | POA: Diagnosis not present

## 2022-07-23 DIAGNOSIS — I610 Nontraumatic intracerebral hemorrhage in hemisphere, subcortical: Secondary | ICD-10-CM | POA: Diagnosis present

## 2022-07-23 DIAGNOSIS — Z9049 Acquired absence of other specified parts of digestive tract: Secondary | ICD-10-CM

## 2022-07-23 DIAGNOSIS — R29727 NIHSS score 27: Secondary | ICD-10-CM | POA: Diagnosis present

## 2022-07-23 DIAGNOSIS — D72829 Elevated white blood cell count, unspecified: Secondary | ICD-10-CM | POA: Diagnosis not present

## 2022-07-23 DIAGNOSIS — Z8249 Family history of ischemic heart disease and other diseases of the circulatory system: Secondary | ICD-10-CM

## 2022-07-23 DIAGNOSIS — R000 Tachycardia, unspecified: Secondary | ICD-10-CM | POA: Diagnosis present

## 2022-07-23 DIAGNOSIS — E7800 Pure hypercholesterolemia, unspecified: Secondary | ICD-10-CM | POA: Diagnosis present

## 2022-07-23 DIAGNOSIS — Z811 Family history of alcohol abuse and dependence: Secondary | ICD-10-CM

## 2022-07-23 DIAGNOSIS — R1310 Dysphagia, unspecified: Secondary | ICD-10-CM | POA: Diagnosis present

## 2022-07-23 DIAGNOSIS — R001 Bradycardia, unspecified: Secondary | ICD-10-CM | POA: Diagnosis not present

## 2022-07-23 DIAGNOSIS — I619 Nontraumatic intracerebral hemorrhage, unspecified: Principal | ICD-10-CM

## 2022-07-23 DIAGNOSIS — F1210 Cannabis abuse, uncomplicated: Secondary | ICD-10-CM | POA: Diagnosis present

## 2022-07-23 DIAGNOSIS — F32A Depression, unspecified: Secondary | ICD-10-CM | POA: Diagnosis present

## 2022-07-23 DIAGNOSIS — Z66 Do not resuscitate: Secondary | ICD-10-CM | POA: Diagnosis not present

## 2022-07-23 DIAGNOSIS — Z6829 Body mass index (BMI) 29.0-29.9, adult: Secondary | ICD-10-CM

## 2022-07-23 DIAGNOSIS — J9600 Acute respiratory failure, unspecified whether with hypoxia or hypercapnia: Secondary | ICD-10-CM | POA: Diagnosis present

## 2022-07-23 DIAGNOSIS — Z515 Encounter for palliative care: Secondary | ICD-10-CM

## 2022-07-23 DIAGNOSIS — F1410 Cocaine abuse, uncomplicated: Secondary | ICD-10-CM | POA: Diagnosis present

## 2022-07-23 DIAGNOSIS — T503X5A Adverse effect of electrolytic, caloric and water-balance agents, initial encounter: Secondary | ICD-10-CM | POA: Diagnosis not present

## 2022-07-23 DIAGNOSIS — Y92239 Unspecified place in hospital as the place of occurrence of the external cause: Secondary | ICD-10-CM | POA: Diagnosis not present

## 2022-07-23 DIAGNOSIS — I4891 Unspecified atrial fibrillation: Secondary | ICD-10-CM | POA: Diagnosis not present

## 2022-07-23 DIAGNOSIS — E669 Obesity, unspecified: Secondary | ICD-10-CM | POA: Diagnosis present

## 2022-07-23 DIAGNOSIS — G4733 Obstructive sleep apnea (adult) (pediatric): Secondary | ICD-10-CM | POA: Diagnosis present

## 2022-07-23 DIAGNOSIS — I110 Hypertensive heart disease with heart failure: Secondary | ICD-10-CM | POA: Diagnosis present

## 2022-07-23 DIAGNOSIS — E876 Hypokalemia: Secondary | ICD-10-CM | POA: Diagnosis not present

## 2022-07-23 DIAGNOSIS — R509 Fever, unspecified: Secondary | ICD-10-CM | POA: Diagnosis not present

## 2022-07-23 DIAGNOSIS — G936 Cerebral edema: Secondary | ICD-10-CM | POA: Diagnosis present

## 2022-07-23 DIAGNOSIS — I5033 Acute on chronic diastolic (congestive) heart failure: Secondary | ICD-10-CM | POA: Diagnosis not present

## 2022-07-23 DIAGNOSIS — F17210 Nicotine dependence, cigarettes, uncomplicated: Secondary | ICD-10-CM | POA: Diagnosis present

## 2022-07-23 DIAGNOSIS — F1090 Alcohol use, unspecified, uncomplicated: Secondary | ICD-10-CM | POA: Diagnosis present

## 2022-07-23 DIAGNOSIS — Z7984 Long term (current) use of oral hypoglycemic drugs: Secondary | ICD-10-CM

## 2022-07-23 DIAGNOSIS — B953 Streptococcus pneumoniae as the cause of diseases classified elsewhere: Secondary | ICD-10-CM | POA: Diagnosis present

## 2022-07-23 DIAGNOSIS — F419 Anxiety disorder, unspecified: Secondary | ICD-10-CM | POA: Diagnosis present

## 2022-07-23 DIAGNOSIS — E871 Hypo-osmolality and hyponatremia: Secondary | ICD-10-CM | POA: Diagnosis present

## 2022-07-23 DIAGNOSIS — I161 Hypertensive emergency: Secondary | ICD-10-CM | POA: Diagnosis present

## 2022-07-23 DIAGNOSIS — Z79899 Other long term (current) drug therapy: Secondary | ICD-10-CM

## 2022-07-23 DIAGNOSIS — G919 Hydrocephalus, unspecified: Secondary | ICD-10-CM | POA: Diagnosis not present

## 2022-07-23 DIAGNOSIS — G935 Compression of brain: Secondary | ICD-10-CM | POA: Diagnosis not present

## 2022-07-23 DIAGNOSIS — I5032 Chronic diastolic (congestive) heart failure: Secondary | ICD-10-CM | POA: Diagnosis present

## 2022-07-23 DIAGNOSIS — R402A Nontraumatic coma due to underlying condition: Secondary | ICD-10-CM | POA: Diagnosis present

## 2022-07-23 DIAGNOSIS — G9349 Other encephalopathy: Secondary | ICD-10-CM | POA: Diagnosis present

## 2022-07-23 DIAGNOSIS — Y92009 Unspecified place in unspecified non-institutional (private) residence as the place of occurrence of the external cause: Secondary | ICD-10-CM

## 2022-07-23 DIAGNOSIS — Z91148 Patient's other noncompliance with medication regimen for other reason: Secondary | ICD-10-CM

## 2022-07-23 DIAGNOSIS — Z825 Family history of asthma and other chronic lower respiratory diseases: Secondary | ICD-10-CM

## 2022-07-23 MED ORDER — ROCURONIUM BROMIDE 10 MG/ML (PF) SYRINGE
PREFILLED_SYRINGE | INTRAVENOUS | Status: AC
  Administered 2022-07-24: 80 mg
  Filled 2022-07-23: qty 10

## 2022-07-23 MED ORDER — ETOMIDATE 2 MG/ML IV SOLN
INTRAVENOUS | Status: AC
  Administered 2022-07-24: 20 mg
  Filled 2022-07-23: qty 20

## 2022-07-23 MED ORDER — MIDAZOLAM HCL 2 MG/2ML IJ SOLN
INTRAMUSCULAR | Status: AC
  Filled 2022-07-23: qty 2

## 2022-07-23 MED ORDER — KETAMINE HCL 50 MG/5ML IJ SOSY
PREFILLED_SYRINGE | INTRAMUSCULAR | Status: AC
  Filled 2022-07-23: qty 10

## 2022-07-23 MED ORDER — FENTANYL CITRATE PF 50 MCG/ML IJ SOSY
PREFILLED_SYRINGE | INTRAMUSCULAR | Status: AC
  Filled 2022-07-23: qty 2

## 2022-07-23 MED ORDER — SUCCINYLCHOLINE CHLORIDE 200 MG/10ML IV SOSY
PREFILLED_SYRINGE | INTRAVENOUS | Status: AC
  Filled 2022-07-23: qty 10

## 2022-07-23 MED ORDER — FENTANYL CITRATE (PF) 100 MCG/2ML IJ SOLN
INTRAMUSCULAR | Status: AC
  Filled 2022-07-23: qty 2

## 2022-07-23 NOTE — ED Triage Notes (Signed)
Pt bib EMS via emergency traffic for suspected overdose. Per EMS they were originally called out for possible CPR. EMS states upon their arrival, pt was unresponsive with pin point pupils and O2 sats of 78% on room air and it was noted that pt had an empty bottle of Gabapentin at bedside. It was also noted to be ETOH and drug paraphernalia in residence. EMS administered 8mg  Narcan on scene. Blood glucose was 174. Pt unable to maintain airway on arrival to ED. Provider at bedside.

## 2022-07-24 ENCOUNTER — Inpatient Hospital Stay (HOSPITAL_COMMUNITY): Payer: MEDICAID

## 2022-07-24 ENCOUNTER — Other Ambulatory Visit: Payer: Self-pay

## 2022-07-24 ENCOUNTER — Emergency Department (HOSPITAL_COMMUNITY): Payer: MEDICAID

## 2022-07-24 ENCOUNTER — Encounter (HOSPITAL_COMMUNITY): Payer: Self-pay | Admitting: Emergency Medicine

## 2022-07-24 DIAGNOSIS — I613 Nontraumatic intracerebral hemorrhage in brain stem: Secondary | ICD-10-CM

## 2022-07-24 DIAGNOSIS — E1165 Type 2 diabetes mellitus with hyperglycemia: Secondary | ICD-10-CM | POA: Diagnosis present

## 2022-07-24 DIAGNOSIS — I610 Nontraumatic intracerebral hemorrhage in hemisphere, subcortical: Secondary | ICD-10-CM | POA: Diagnosis present

## 2022-07-24 DIAGNOSIS — Y92009 Unspecified place in unspecified non-institutional (private) residence as the place of occurrence of the external cause: Secondary | ICD-10-CM | POA: Diagnosis not present

## 2022-07-24 DIAGNOSIS — R402A Nontraumatic coma due to underlying condition: Secondary | ICD-10-CM | POA: Diagnosis present

## 2022-07-24 DIAGNOSIS — I468 Cardiac arrest due to other underlying condition: Secondary | ICD-10-CM | POA: Diagnosis present

## 2022-07-24 DIAGNOSIS — G919 Hydrocephalus, unspecified: Secondary | ICD-10-CM | POA: Diagnosis not present

## 2022-07-24 DIAGNOSIS — I619 Nontraumatic intracerebral hemorrhage, unspecified: Principal | ICD-10-CM

## 2022-07-24 DIAGNOSIS — I615 Nontraumatic intracerebral hemorrhage, intraventricular: Secondary | ICD-10-CM | POA: Diagnosis present

## 2022-07-24 DIAGNOSIS — E870 Hyperosmolality and hypernatremia: Secondary | ICD-10-CM | POA: Diagnosis not present

## 2022-07-24 DIAGNOSIS — Y92239 Unspecified place in hospital as the place of occurrence of the external cause: Secondary | ICD-10-CM | POA: Diagnosis not present

## 2022-07-24 DIAGNOSIS — G936 Cerebral edema: Secondary | ICD-10-CM | POA: Diagnosis present

## 2022-07-24 DIAGNOSIS — E669 Obesity, unspecified: Secondary | ICD-10-CM | POA: Diagnosis present

## 2022-07-24 DIAGNOSIS — J9600 Acute respiratory failure, unspecified whether with hypoxia or hypercapnia: Secondary | ICD-10-CM | POA: Diagnosis present

## 2022-07-24 DIAGNOSIS — Z66 Do not resuscitate: Secondary | ICD-10-CM | POA: Diagnosis not present

## 2022-07-24 DIAGNOSIS — I110 Hypertensive heart disease with heart failure: Secondary | ICD-10-CM | POA: Diagnosis present

## 2022-07-24 DIAGNOSIS — F1410 Cocaine abuse, uncomplicated: Secondary | ICD-10-CM | POA: Diagnosis present

## 2022-07-24 DIAGNOSIS — E440 Moderate protein-calorie malnutrition: Secondary | ICD-10-CM | POA: Diagnosis present

## 2022-07-24 DIAGNOSIS — G935 Compression of brain: Secondary | ICD-10-CM | POA: Diagnosis not present

## 2022-07-24 DIAGNOSIS — Z7189 Other specified counseling: Secondary | ICD-10-CM | POA: Diagnosis not present

## 2022-07-24 DIAGNOSIS — I5032 Chronic diastolic (congestive) heart failure: Secondary | ICD-10-CM | POA: Diagnosis present

## 2022-07-24 DIAGNOSIS — T405X1A Poisoning by cocaine, accidental (unintentional), initial encounter: Secondary | ICD-10-CM | POA: Diagnosis not present

## 2022-07-24 DIAGNOSIS — I6389 Other cerebral infarction: Secondary | ICD-10-CM | POA: Diagnosis not present

## 2022-07-24 DIAGNOSIS — Y900 Blood alcohol level of less than 20 mg/100 ml: Secondary | ICD-10-CM | POA: Diagnosis present

## 2022-07-24 DIAGNOSIS — I4891 Unspecified atrial fibrillation: Secondary | ICD-10-CM | POA: Diagnosis not present

## 2022-07-24 DIAGNOSIS — Z515 Encounter for palliative care: Secondary | ICD-10-CM | POA: Diagnosis not present

## 2022-07-24 DIAGNOSIS — I5033 Acute on chronic diastolic (congestive) heart failure: Secondary | ICD-10-CM | POA: Diagnosis not present

## 2022-07-24 DIAGNOSIS — B953 Streptococcus pneumoniae as the cause of diseases classified elsewhere: Secondary | ICD-10-CM | POA: Diagnosis present

## 2022-07-24 DIAGNOSIS — G9349 Other encephalopathy: Secondary | ICD-10-CM | POA: Diagnosis present

## 2022-07-24 DIAGNOSIS — F32A Depression, unspecified: Secondary | ICD-10-CM | POA: Diagnosis present

## 2022-07-24 LAB — BLOOD GAS, ARTERIAL
Acid-Base Excess: 1.7 mmol/L (ref 0.0–2.0)
Bicarbonate: 27.7 mmol/L (ref 20.0–28.0)
O2 Saturation: 96.1 %
Patient temperature: 37
pCO2 arterial: 48 mmHg (ref 32–48)
pH, Arterial: 7.37 (ref 7.35–7.45)
pO2, Arterial: 75 mmHg — ABNORMAL LOW (ref 83–108)

## 2022-07-24 LAB — DIFFERENTIAL
Abs Immature Granulocytes: 0.08 10*3/uL — ABNORMAL HIGH (ref 0.00–0.07)
Basophils Absolute: 0.1 10*3/uL (ref 0.0–0.1)
Basophils Relative: 1 %
Eosinophils Absolute: 0.1 10*3/uL (ref 0.0–0.5)
Eosinophils Relative: 1 %
Immature Granulocytes: 1 %
Lymphocytes Relative: 31 %
Lymphs Abs: 4.4 10*3/uL — ABNORMAL HIGH (ref 0.7–4.0)
Monocytes Absolute: 1.1 10*3/uL — ABNORMAL HIGH (ref 0.1–1.0)
Monocytes Relative: 7 %
Neutro Abs: 8.7 10*3/uL — ABNORMAL HIGH (ref 1.7–7.7)
Neutrophils Relative %: 59 %

## 2022-07-24 LAB — RAPID URINE DRUG SCREEN, HOSP PERFORMED
Amphetamines: NOT DETECTED
Barbiturates: NOT DETECTED
Benzodiazepines: POSITIVE — AB
Cocaine: POSITIVE — AB
Opiates: NOT DETECTED
Tetrahydrocannabinol: POSITIVE — AB

## 2022-07-24 LAB — CBC
HCT: 51.4 % — ABNORMAL HIGH (ref 36.0–46.0)
Hemoglobin: 17.2 g/dL — ABNORMAL HIGH (ref 12.0–15.0)
MCH: 31.3 pg (ref 26.0–34.0)
MCHC: 33.5 g/dL (ref 30.0–36.0)
MCV: 93.5 fL (ref 80.0–100.0)
Platelets: 322 10*3/uL (ref 150–400)
RBC: 5.5 MIL/uL — ABNORMAL HIGH (ref 3.87–5.11)
RDW: 12.5 % (ref 11.5–15.5)
WBC: 14.4 10*3/uL — ABNORMAL HIGH (ref 4.0–10.5)
nRBC: 0 % (ref 0.0–0.2)

## 2022-07-24 LAB — URINALYSIS, ROUTINE W REFLEX MICROSCOPIC
Bacteria, UA: NONE SEEN
Bacteria, UA: NONE SEEN
Bilirubin Urine: NEGATIVE
Bilirubin Urine: NEGATIVE
Glucose, UA: 50 mg/dL — AB
Glucose, UA: NEGATIVE mg/dL
Hgb urine dipstick: NEGATIVE
Ketones, ur: NEGATIVE mg/dL
Ketones, ur: NEGATIVE mg/dL
Leukocytes,Ua: NEGATIVE
Leukocytes,Ua: NEGATIVE
Nitrite: NEGATIVE
Nitrite: NEGATIVE
Protein, ur: 30 mg/dL — AB
Protein, ur: 30 mg/dL — AB
Specific Gravity, Urine: 1.019 (ref 1.005–1.030)
Specific Gravity, Urine: 1.034 — ABNORMAL HIGH (ref 1.005–1.030)
pH: 6 (ref 5.0–8.0)
pH: 5 (ref 5.0–8.0)

## 2022-07-24 LAB — COMPREHENSIVE METABOLIC PANEL
ALT: 28 U/L (ref 0–44)
AST: 25 U/L (ref 15–41)
Albumin: 4.1 g/dL (ref 3.5–5.0)
Alkaline Phosphatase: 119 U/L (ref 38–126)
Anion gap: 11 (ref 5–15)
BUN: 13 mg/dL (ref 6–20)
CO2: 22 mmol/L (ref 22–32)
Calcium: 8.6 mg/dL — ABNORMAL LOW (ref 8.9–10.3)
Chloride: 98 mmol/L (ref 98–111)
Creatinine, Ser: 0.85 mg/dL (ref 0.44–1.00)
GFR, Estimated: >60 mL/min (ref >60)
Glucose, Bld: 185 mg/dL — ABNORMAL HIGH (ref 70–99)
Potassium: 3.5 mmol/L (ref 3.5–5.1)
Sodium: 131 mmol/L — ABNORMAL LOW (ref 135–145)
Total Bilirubin: 0.9 mg/dL (ref 0.3–1.2)
Total Protein: 8.5 g/dL — ABNORMAL HIGH (ref 6.5–8.1)

## 2022-07-24 LAB — ETHANOL: Alcohol, Ethyl (B): <10 mg/dL (ref <10)

## 2022-07-24 LAB — LIPID PANEL
Cholesterol: 230 mg/dL — ABNORMAL HIGH (ref 0–200)
HDL: 47 mg/dL (ref >40)
LDL Cholesterol: UNDETERMINED mg/dL (ref 0–99)
Total CHOL/HDL Ratio: 4.9 RATIO
Triglycerides: 462 mg/dL — ABNORMAL HIGH (ref <150)
VLDL: UNDETERMINED mg/dL (ref 0–40)

## 2022-07-24 LAB — HEMOGLOBIN A1C
Hgb A1c MFr Bld: 6.5 % — ABNORMAL HIGH (ref 4.8–5.6)
Mean Plasma Glucose: 139.85 mg/dL

## 2022-07-24 LAB — MRSA NEXT GEN BY PCR, NASAL: MRSA by PCR Next Gen: NOT DETECTED

## 2022-07-24 LAB — APTT: aPTT: 25 seconds (ref 24–36)

## 2022-07-24 LAB — LDL CHOLESTEROL, DIRECT: Direct LDL: 119 mg/dL — ABNORMAL HIGH (ref 0–99)

## 2022-07-24 LAB — SODIUM
Sodium: 147 mmol/L — ABNORMAL HIGH (ref 135–145)
Sodium: 152 mmol/L — ABNORMAL HIGH (ref 135–145)
Sodium: 155 mmol/L — ABNORMAL HIGH (ref 135–145)
Sodium: 156 mmol/L — ABNORMAL HIGH (ref 135–145)

## 2022-07-24 LAB — MAGNESIUM: Magnesium: 2.5 mg/dL — ABNORMAL HIGH (ref 1.7–2.4)

## 2022-07-24 LAB — PROTIME-INR
INR: 0.9 (ref 0.8–1.2)
Prothrombin Time: 12.3 seconds (ref 11.4–15.2)

## 2022-07-24 LAB — HIV ANTIBODY (ROUTINE TESTING W REFLEX): HIV Screen 4th Generation wRfx: NONREACTIVE

## 2022-07-24 LAB — TROPONIN I (HIGH SENSITIVITY): Troponin I (High Sensitivity): 9 ng/L (ref <18)

## 2022-07-24 MED ORDER — SODIUM CHLORIDE 3 % IV SOLN
INTRAVENOUS | Status: DC
  Filled 2022-07-24 (×2): qty 500

## 2022-07-24 MED ORDER — SODIUM CHLORIDE 3 % IV SOLN: INTRAVENOUS | Status: AC

## 2022-07-24 MED ORDER — BROMOCRIPTINE MESYLATE 2.5 MG PO TABS
2.5000 mg | ORAL_TABLET | Freq: Two times a day (BID) | ORAL | Status: DC
  Administered 2022-07-24 (×2): 2.5 mg
  Filled 2022-07-24 (×3): qty 1

## 2022-07-24 MED ORDER — IOHEXOL 350 MG/ML SOLN
75.0000 mL | Freq: Once | INTRAVENOUS | Status: AC | PRN
  Administered 2022-07-24: 75 mL via INTRAVENOUS

## 2022-07-24 MED ORDER — ACETAMINOPHEN 160 MG/5ML PO SOLN
650.0000 mg | ORAL | Status: DC | PRN
  Administered 2022-07-24 – 2022-07-27 (×7): 650 mg
  Filled 2022-07-24 (×7): qty 20.3

## 2022-07-24 MED ORDER — STROKE: EARLY STAGES OF RECOVERY BOOK
Freq: Once | Status: AC
  Filled 2022-07-24: qty 1

## 2022-07-24 MED ORDER — PROPOFOL 1000 MG/100ML IV EMUL
5.0000 ug/kg/min | INTRAVENOUS | Status: DC
  Administered 2022-07-24 (×2): 60 ug/kg/min via INTRAVENOUS
  Administered 2022-07-24: 30 ug/kg/min via INTRAVENOUS
  Administered 2022-07-24: 40 ug/kg/min via INTRAVENOUS
  Administered 2022-07-25: 30 ug/kg/min via INTRAVENOUS
  Administered 2022-07-25: 35 ug/kg/min via INTRAVENOUS
  Filled 2022-07-24 (×7): qty 100

## 2022-07-24 MED ORDER — PANTOPRAZOLE SODIUM 40 MG IV SOLR
40.0000 mg | Freq: Every day | INTRAVENOUS | Status: DC
  Administered 2022-07-24 – 2022-07-27 (×4): 40 mg via INTRAVENOUS
  Filled 2022-07-24 (×4): qty 10

## 2022-07-24 MED ORDER — CHLORHEXIDINE GLUCONATE CLOTH 2 % EX PADS
6.0000 | MEDICATED_PAD | Freq: Every day | CUTANEOUS | Status: DC
  Administered 2022-07-24 – 2022-07-27 (×5): 6 via TOPICAL

## 2022-07-24 MED ORDER — SODIUM CHLORIDE 0.9 % IV BOLUS
1000.0000 mL | Freq: Once | INTRAVENOUS | Status: AC
  Administered 2022-07-24: 1000 mL via INTRAVENOUS

## 2022-07-24 MED ORDER — SENNOSIDES-DOCUSATE SODIUM 8.6-50 MG PO TABS
1.0000 | ORAL_TABLET | Freq: Two times a day (BID) | ORAL | Status: DC
  Administered 2022-07-24 – 2022-07-27 (×8): 1
  Filled 2022-07-24 (×9): qty 1

## 2022-07-24 MED ORDER — ORAL CARE MOUTH RINSE
15.0000 mL | OROMUCOSAL | Status: DC
  Administered 2022-07-24 (×6): 15 mL via OROMUCOSAL

## 2022-07-24 MED ORDER — ACETAMINOPHEN 650 MG RE SUPP
650.0000 mg | RECTAL | Status: DC | PRN
  Administered 2022-07-24: 650 mg via RECTAL
  Filled 2022-07-24: qty 1

## 2022-07-24 MED ORDER — CLEVIDIPINE BUTYRATE 0.5 MG/ML IV EMUL
0.0000 mg/h | INTRAVENOUS | Status: DC
  Administered 2022-07-24: 15 mg/h via INTRAVENOUS
  Administered 2022-07-24: 11 mg/h via INTRAVENOUS
  Administered 2022-07-24: 2 mg/h via INTRAVENOUS
  Administered 2022-07-24: 6 mg/h via INTRAVENOUS
  Administered 2022-07-24: 13 mg/h via INTRAVENOUS
  Administered 2022-07-25: 2 mg/h via INTRAVENOUS
  Filled 2022-07-24 (×9): qty 50

## 2022-07-24 MED ORDER — LOSARTAN POTASSIUM 50 MG PO TABS: 100.0000 mg | ORAL_TABLET | Freq: Every day | ORAL | Status: DC

## 2022-07-24 MED ORDER — AMLODIPINE BESYLATE 10 MG PO TABS
10.0000 mg | ORAL_TABLET | Freq: Every day | ORAL | Status: DC
  Administered 2022-07-24 – 2022-07-27 (×4): 10 mg
  Filled 2022-07-24 (×4): qty 1

## 2022-07-24 MED ORDER — MANNITOL 20 % IV SOLN
1.0000 g/kg | Freq: Once | INTRAVENOUS | Status: AC
  Administered 2022-07-24: 75 g via INTRAVENOUS

## 2022-07-24 MED ORDER — SODIUM CHLORIDE 3 % IV BOLUS
250.0000 mL | Freq: Once | INTRAVENOUS | Status: AC
  Administered 2022-07-24: 250 mL via INTRAVENOUS
  Filled 2022-07-24: qty 500

## 2022-07-24 MED ORDER — PROPOFOL 1000 MG/100ML IV EMUL
5.0000 ug/kg/min | INTRAVENOUS | Status: DC
Start: 2022-07-24 — End: 2022-07-24

## 2022-07-24 MED ORDER — PROPOFOL 1000 MG/100ML IV EMUL
INTRAVENOUS | Status: AC
  Administered 2022-07-24: 40 ug/kg/min via INTRAVENOUS
  Filled 2022-07-24: qty 100

## 2022-07-24 MED ORDER — ACETAMINOPHEN 325 MG PO TABS: 650.0000 mg | ORAL_TABLET | ORAL | Status: DC | PRN

## 2022-07-24 MED ORDER — ORAL CARE MOUTH RINSE: 15.0000 mL | OROMUCOSAL | Status: DC | PRN

## 2022-07-24 MED ORDER — CLEVIDIPINE BUTYRATE 0.5 MG/ML IV EMUL: 0.0000 mg/h | INTRAVENOUS | Status: DC

## 2022-07-24 MED ORDER — SODIUM CHLORIDE 3 % IV SOLN: INTRAVENOUS | Status: DC

## 2022-07-24 NOTE — Consult Note (Signed)
[facility] Consult Services   Patient Name:   Beth Sanford, Beth Sanford Date of Birth:   13-Jul-1970 Identification Number:   MRN - [id number] Date of Service:   07/24/2022 00:23:14  Diagnosis:       I61.3 - Intracerebral hemorrhage in brain stem       R41.89 - Unresponsive  Impression: 51yo woman with history of HTN, DM presents with unresponsiveness. NCCT unfortunately shows a large brainstem hematoma with extension into third and fourth ventricle spaces. CTA shows spot sign within the hemorrhage. Recommend stat neurosurgery consult with particular concern for elevated ICP/potential for herniation. Will give mannitol 1g/kg and 250cc 3% NS bolus for treatment. Keep SBP <140s. Hold AC/AP.  Recommendation:  Diagnostic Studies:      Repeat CT head in first 8-12hrs      CTA head and neck with contrast  Medications:       Hold?antiplatelet?therapy/NSAIDS/Anticoagulation  Nursing Recommendations:       Telemetry, IV Fluids?Avoid dextrose containing fluids, Maintain euglycemia       Head of bed 30 degrees       Neuro checks q1-2?hrs?during ICU stay       keep BP less than 140/90's with goal of 130/80s  Consultations:       Need Neurosurgery consultation?STAT       Recommend Speech therapy if failed dysphagia screen       Physical therapy/Occupational therapy  DVT Prophylaxis:       SCDs  ------------------------------------------------------------------------------  Metrics: Last Known Well: Aug 21, 2022 21:00:00 [facility] Notification Time: 07/24/2022 00:23:14 Arrival Time: 08-21-22 23:55:00 Stamp Time: 07/24/2022 00:23:14 Initial Response Time: 07/24/2022 00:26:38 Symptoms: unresponsive. Initial patient interaction: 07/24/2022 00:36:38 NIHSS Assessment Completed: 07/24/2022 00:44:27 Patient is not a candidate for Thrombolytic. Thrombolytic Medical Decision: 07/24/2022 00:44:27 Patient was not deemed candidate for Thrombolytic because of following  reasons: Current or Previous ICH.  I personally Reviewed the CT Head and it Showed brainstem IPH with adjacent IVH  Primary Provider Notified of Diagnostic Impression and Management Plan on: 07/24/2022 00:51:36    History of Present Illness: Patient is a 52 year old Female.  Patient was brought by EMS for symptoms of unresponsive. 51yo woman with history of HTN, DM presents with unresponsiveness. She was last known well per family at 9pm tonight. They were unable to rouse her from bed and ended up calling EMS at around midnight. She was emergently intubated in the ED due to inability to protect airway. Per family she was noncompliant with antihypertensive medications for some time. Per family, not on any anticoagulants or antiplatelet agents.   Past Medical History:      Hypertension      Diabetes Mellitus  Medications:  No Anticoagulant use  No Antiplatelet use Reviewed EMR for current medications  Allergies:  Reviewed  Social History: Unable To Obtain Due To Patient Status : Patient Is Obtunded/ Comatose  Family History:  There is no family history of premature cerebrovascular disease pertinent to this consultation  ROS : ROS Cannot Be Obtained Because:  Patient Is Obtunded/ Comatose  Past Surgical History: There Is No Surgical History Contributory To Today's Visit  NIHSS may not be reliable due to: Patient was intubated and paralyzed  Examination: BP(217//132), Pulse(93), 1A: Level of Consciousness - Postures or Unresponsive + 3 1B: Ask Month and Age - Dysarthric/Intubated/ Trauma/Language Barrier + 1 1C: Blink Eyes & Squeeze Hands - Performs 0 Tasks + 2 2: Test Horizontal Extraocular Movements - Normal + 0 3: Test Visual Fields - No Visual  Loss + 0 4: Test Facial Palsy (Use Grimace if Obtunded) - Normal symmetry + 0 5A: Test Left Arm Motor Drift - No Movement + 4 5B: Test Right Arm Motor Drift - No Movement + 4 6A: Test Left Leg Motor Drift - No Movement +  4 6B: Test Right Leg Motor Drift - No Movement + 4 7: Test Limb Ataxia (FNF/Heel-Shin) - Paralyzed + 0 8: Test Sensation - Coma/Unresponsive + 2 9: Test Language/Aphasia - Coma/Unresponsive + 3 10: Test Dysarthria - Intubated/Unable to Test + 0 11: Test Extinction/Inattention - No abnormality + 0 NIHSS Score: 27  ICH Score: 4  NIHSS Text : midline gaze, negative occulocephalics, not breathing over vent, no cough, no w/d to pain, recently paralyzed   GlasGow Coma Score: 3-4 (+2)  Age >= 80: No (0)  ICH volume >= 30mL: No (0)  Intraventricular hemorrhage: Yes (+1)  Infratentorial origin of hemorrhage: Yes (+1)  Pre-Morbid Modified Rankin Scale: 7 Points = Unable to assess   This consult was conducted in real time using interactive audio and Immunologist. Family was informed of the technology being used for this visit. Patient located in hospital and provider located at home/office setting.  Due to the immediate potential for life-threatening deterioration due to underlying acute neurologic illness, I spent 35 minutes providing critical care. This time includes time for face to face visit via telemedicine, review of medical records, imaging studies and discussion of findings with providers, the patient and/or family.  Dr Idelle Jo  [facility] For Inpatient follow-up with [facility] physician please [facility] [phone]. This is not an outpatient service. Post hospital discharge, please contact hospital directly.  Please do not communicate with [facility] physicians via secure chat. If you have any questions, Please contact [facility]. Please [place] or reconsult our service if there are any clinical or diagnostic changes.

## 2022-07-24 NOTE — Consult Note (Signed)
NAMEDiantha Paxson Sanford, MRN:  [id number], DOB:  30-Jul-1970, LOS: 0 ADMISSION DATE:  07/18/2022, CONSULTATION DATE:  07/24/22 REFERRING MD:  Dr Iver Nestle, CHIEF COMPLAINT:  brainstem hemorrage, resp failure   History of Present Illness:  60 yofemale with pmh drug abuse, htn, hyperlipidemia, osa found down and ems called. Pt retained pulses but was minimally responsive and htn-I've. 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 Case was reportedly d/w neurosx who did not feel the pt was a surgical candidate however family desires full code and thus pt was transferred to [facility] for further monitoring.   All history is obtained from chart and neuro as pt is unresponsive on vent.   CCM has been consulted for vent management  Pertinent  Medical History  Drug use Htn hyperlipidemia  Significant Hospital Events: Including procedures, antibiotic start and stop dates in addition to other pertinent events   Presented to [facility] with weakness Transferred to [facility] 2/2 large hemorrhage with extension into ventricles 7/20  Interim History / Subjective:    Objective   Blood pressure (!) 140/80, pulse 92, temperature (!) 96.9 F (36.1 C), resp. rate 20, height 5\' 2"  (1.575 m), weight 72.4 kg, last menstrual period 03/05/2018, SpO2 95%.    Vent Mode: PRVC FiO2 (%):  [40 %] 40 % Set Rate:  [20 bmp] 20 bmp Vt Set:  [400 mL] 400 mL PEEP:  [5 cmH20] 5 cmH20 Plateau Pressure:  [17 cmH20] 17 cmH20   Intake/Output Summary (Last 24 hours) at 07/24/2022 0225 Last data filed at 07/24/2022 0107 Gross per 24 hour  Intake 1000 ml  Output --  Net 1000 ml   Filed Weights   07/24/22 0002  Weight: 72.4 kg    Examination: General: unresponsive on vent HENT: ncat, pupils pinpoint and non reactive, anicteric mmmp Lungs: ctab Cardiovascular: +SEM, rrr Abdomen: protuberant but soft, bs +  Extremities: no c/c/e Neuro: to  withdrawal to pain, no pupillary response, + minimal gag GU: deferred  Resolved Hospital Problem list     Assessment & Plan:  Acute brainstem hemorrhage with intraventricular extension Acute encephalopathy 2/2 above Cerebral edema Acute resp failure 2/2 above req mechanical ventilation Drug abuse with cocaine -appreciate neuro and neurosx -given hypertonic therapy,  triple H therapy -sbp goal <140 -repeat imaging per neuro -not a candidate for intervention -suspect will progress to brain death -titrate vent -remain off sedation   Best Practice (right click and "Reselect all SmartList Selections" daily)   Diet/type: NPO DVT prophylaxis: SCD GI prophylaxis: PPI Lines: N/A Foley:  Yes, and it is still needed Code Status:  full code Last date of multidisciplinary goals of care discussion [per primary]  Labs   CBC: Recent Labs  Lab 07/24/22 0008  WBC 14.4*  NEUTROABS 8.7*  HGB 17.2*  HCT 51.4*  MCV 93.5  PLT 322    Basic Metabolic Panel: Recent Labs  Lab 07/24/22 0008  NA 131*  K 3.5  CL 98  CO2 22  GLUCOSE 185*  BUN 13  CREATININE 0.85  CALCIUM 8.6*   GFR: Estimated Creatinine Clearance: 72.1 mL/min (by C-G formula based on SCr of 0.85 mg/dL). Recent Labs  Lab 07/24/22 0008  WBC 14.4*    Liver Function Tests: Recent Labs  Lab 07/24/22 0008  AST 25  ALT 28  ALKPHOS 119  BILITOT 0.9  PROT 8.5*  ALBUMIN 4.1   No results for input(s): "LIPASE", "  AMYLASE" in the last 168 hours. No results for input(s): "AMMONIA" in the last 168 hours.  ABG    Component Value Date/Time   PHART 7.37 07/24/2022 0052   PCO2ART 48 07/24/2022 0052   PO2ART 75 (L) 07/24/2022 0052   HCO3 27.7 07/24/2022 0052   TCO2 28 06/28/2016 1908   O2SAT 96.1 07/24/2022 0052     Coagulation Profile: Recent Labs  Lab 07/24/22 0008  INR 0.9    Cardiac Enzymes: No results for input(s): "CKTOTAL", "CKMB", "CKMBINDEX", "TROPONINI" in the last 168 hours.  HbA1C: Hgb  A1c MFr Bld  Date/Time Value Ref Range Status  02/18/2020 03:36 PM 6.3 (H) 4.8 - 5.6 % Final    Comment:    (NOTE)         Prediabetes: 5.7 - 6.4         Diabetes: >6.4         Glycemic control for adults with diabetes: <7.0   09/24/2019 01:40 PM 6.6 (H) 4.8 - 5.6 % Final    Comment:    (NOTE) Pre diabetes:          5.7%-6.4%  Diabetes:              >6.4%  Glycemic control for   <7.0% adults with diabetes     CBG: No results for input(s): "GLUCAP" in the last 168 hours.  Review of Systems:   Unobtainable 2/2 intubated status and pathology of ich  Past Medical History:  She,  has a past medical history of Anxiety, CHF (congestive heart failure) (HCC), Depression, High cholesterol, Hypertension, and OSA (obstructive sleep apnea) (09/13/2014).   Surgical History:   Past Surgical History:  Procedure Laterality Date   BIOPSY  05/30/2019   Procedure: BIOPSY;  Surgeon: Malissa Hippo, MD;  Location: [facility];  Service: Endoscopy;;  esophagus   CHOLECYSTECTOMY     ELBOW SURGERY  left   ESOPHAGOGASTRODUODENOSCOPY N/A 05/30/2019   Procedure: ESOPHAGOGASTRODUODENOSCOPY (EGD);  Surgeon: Malissa Hippo, MD;  Location: [facility];  Service: Endoscopy;  Laterality: N/A;  150     Social History:   reports that she has been smoking cigarettes. She started smoking about 38 years ago. She has never used smokeless tobacco. She reports that she does not drink alcohol and does not use drugs.   Family History:  Her family history includes Alcohol abuse in her father; Bipolar disorder in her brother; Emphysema in her maternal grandfather; Heart failure in her father; Hypertension in her brother. There is no history of Colon cancer.   Allergies Allergies  Allergen Reactions   Codeine Itching and Nausea Only   Tramadol Nausea Only     Home Medications  Prior to Admission medications   Medication Sig Start Date End Date Taking? Authorizing Provider  allopurinol (ZYLOPRIM) 300  MG tablet Take 1 tablet (300 mg total) by mouth daily. 11/11/20   Darreld Mclean, MD  atorvastatin (LIPITOR) 40 MG tablet Take 1 tablet (40 mg total) by mouth daily. 01/14/20   Jacquelin Hawking, PA-C  carvedilol (COREG) 6.25 MG tablet Take 1 tablet (6.25 mg total) by mouth 2 (two) times daily with a meal. 01/14/20   Jacquelin Hawking, PA-C  colchicine 0.6 MG tablet Take 1 tablet (0.6 mg total) by mouth daily. 12/08/19   Devoria Albe, MD  escitalopram (LEXAPRO) 10 MG tablet Take 10 mg by mouth daily. 10/14/20   [provider]  irbesartan (AVAPRO) 300 MG tablet Take 1 tablet (300 mg total) by  mouth daily. 01/24/20   Jacquelin Hawking, PA-C  losartan (COZAAR) 100 MG tablet Take 1 tablet (100 mg total) by mouth daily. 01/14/20   Jacquelin Hawking, PA-C  metFORMIN (GLUCOPHAGE XR) 500 MG 24 hr tablet Take 1 tablet (500 mg total) by mouth daily with breakfast. 01/14/20   Jacquelin Hawking, PA-C  nitroGLYCERIN (NITROSTAT) 0.4 MG SL tablet Place 1 tablet (0.4 mg total) under the tongue every 5 (five) minutes as needed for chest pain. 01/14/20   Jacquelin Hawking, PA-C  omeprazole (PRILOSEC) 40 MG capsule TAKE 1 Capsule BY MOUTH ONCE EVERY DAY 01/14/20   Jacquelin Hawking, PA-C     Critical care time: 

## 2022-07-24 NOTE — Progress Notes (Signed)
eLink Physician-Brief Progress Note Patient Name: Beth Sanford DOB: 10-25-1970 MRN: [id number]   Date of Service  07/24/2022  HPI/Events of Note  Patient admitted with a large brainstem hemorrhage with intra-ventricular extension, coma, and acute respiratory failure requiring intubation and mechanical ventilation. Neurosurgery is following.  eICU Interventions  New Patient Evaluation.        Beth Sanford 07/24/2022, 2:15 AM

## 2022-07-24 NOTE — Progress Notes (Signed)
RT transported pt on vent to CT and back to 4N24 with no complications

## 2022-07-24 NOTE — Progress Notes (Addendum)
STROKE TEAM PROGRESS NOTE   BRIEF HPI Ms. Zyan C Hernan is a 52 y.o. female with history of hypertension, hyperlipidemia, BMI 29.19, obstructive sleep apnea, polysubstance abuse (tobacco, alcohol, cocaine, benzodiazepines, THC) presenting with minimal responsiveness and hypertensive emergency.  Patient was given Narcan by EMS due to pinpoint nonreactive pupils with no response. NIH 27.  Neurosurgery consulted, patient not a surgical candidate. LKW: 9 PM Thrombolytic given?: No, ICH IA performed?: No, ICH ICH Score: 4  SIGNIFICANT HOSPITAL EVENTS 7/20 early AM: Patient transferred to [facility] for medical management after head CT revealed acute ICH with IVH. Repeat CT shows unchanged large brainstem hemorrhage but increase in intraventricular clot. New mild hydrocephalus.   INTERIM HISTORY/SUBJECTIVE RN at bedside.  On exam patient is intubated.  No sedation.  Negative doll's eyes, negative corneals, positive cough, weak gag.  Patient has no response to noxious stimuli bilateral upper extremities, triple flexion in bilateral lower extremities.  3% hypertonic saline is running at 75, decreased to 50 given the large increase in Na overnight..  Patient is on Cleviprex at 11 for blood pressure control. Palliative care is consulted, discussed with mother over the phone.   Afternoon addendum: Poor prognosis discussed at bedside with patients brother, niece. They stated that patient's mother was on the way to the hospital.   OBJECTIVE  CBC    Component Value Date/Time   WBC 14.4 (H) 07/24/2022 0008   RBC 5.50 (H) 07/24/2022 0008   HGB 17.2 (H) 07/24/2022 0008   HCT 51.4 (H) 07/24/2022 0008   PLT 322 07/24/2022 0008   MCV 93.5 07/24/2022 0008   MCH 31.3 07/24/2022 0008   MCHC 33.5 07/24/2022 0008   RDW 12.5 07/24/2022 0008   LYMPHSABS 4.4 (H) 07/24/2022 0008   MONOABS 1.1 (H) 07/24/2022 0008   EOSABS 0.1 07/24/2022 0008   BASOSABS 0.1 07/24/2022 0008    BMET    Component Value  Date/Time   NA 131 (L) 07/24/2022 0008   K 3.5 07/24/2022 0008   CL 98 07/24/2022 0008   CO2 22 07/24/2022 0008   GLUCOSE 185 (H) 07/24/2022 0008   BUN 13 07/24/2022 0008   CREATININE 0.85 07/24/2022 0008   CREATININE 0.72 01/27/2016 1432   CALCIUM 8.6 (L) 07/24/2022 0008   GFRNONAA >60 07/24/2022 0008    IMAGING past 24 hours CT HEAD WO CONTRAST  Result Date: 07/24/2022 CLINICAL DATA:  6 hour follow-up of brain bleed. EXAM: CT HEAD WITHOUT CONTRAST TECHNIQUE: Contiguous axial images were obtained from the base of the skull through the vertex without intravenous contrast. RADIATION DOSE REDUCTION: This exam was performed according to the departmental dose-optimization program which includes automated exposure control, adjustment of the mA and/or kV according to patient size and/or use of iterative reconstruction technique. COMPARISON:  Earlier today FINDINGS: Brain: Extensive hemorrhage in the bilateral pons extending upward into the midbrain with intraventricular clot throughout the ventricular system and extending through the obex. Blood clot in the lateral ventricles has progressed with layering in the occipital horns. New ventriculomegaly. No evidence of gray matter infarct. No shift or herniation. Vascular: No hyperdense vessel or unexpected calcification. Skull: Normal. Negative for fracture or focal lesion. Sinuses/Orbits: No acute finding. Other: Prelim sent in epic chat. IMPRESSION: Large brainstem hemorrhage is unchanged but there is increase in intraventricular clot. New mild hydrocephalus. Electronically Signed   By: Tiburcio Pea M.D.   On: 07/24/2022 07:29   DG Chest Port 1 View  Result Date: 07/24/2022 CLINICAL DATA:  Altered  mental status, intubation EXAM: PORTABLE CHEST 1 VIEW COMPARISON:  04/07/2020 FINDINGS: Endotracheal tube is 2 cm above the carina. NG tube is in the stomach. Heart and mediastinal contours are within normal limits. Mild vascular congestion. Low lung  volumes. No confluent opacities or effusions. IMPRESSION: Endotracheal tube 2 cm above the carina. Low lung volumes.  Vascular congestion Electronically Signed   By: Charlett Nose M.D.   On: 07/24/2022 01:20   CT ANGIO HEAD NECK W WO CM  Result Date: 07/24/2022 CLINICAL DATA:  Initial evaluation for neuro deficit, stroke. EXAM: CT ANGIOGRAPHY HEAD AND NECK WITH AND WITHOUT CONTRAST TECHNIQUE: Multidetector CT imaging of the head and neck was performed using the standard protocol during bolus administration of intravenous contrast. Multiplanar CT image reconstructions and MIPs were obtained to evaluate the vascular anatomy. Carotid stenosis measurements (when applicable) are obtained utilizing NASCET criteria, using the distal internal carotid diameter as the denominator. RADIATION DOSE REDUCTION: This exam was performed according to the departmental dose-optimization program which includes automated exposure control, adjustment of the mA and/or kV according to patient size and/or use of iterative reconstruction technique. CONTRAST:  75mL OMNIPAQUE IOHEXOL 350 MG/ML SOLN COMPARISON:  Prior CT from earlier the same day. FINDINGS: CTA NECK FINDINGS Aortic arch: Visualized aortic arch normal in caliber with standard branch pattern. Mild atheromatous change about the arch itself. No stenosis about the origin the great vessels. Right carotid system: Right common and internal carotid arteries are patent without dissection. Mild atheromatous change about the right carotid bulb without hemodynamically significant greater than 50% stenosis. Left carotid system: Left common and internal carotid arteries are patent without dissection. Mild atheromatous change about the left carotid bulb without hemodynamically significant greater 50% stenosis. Vertebral arteries: Left vertebral artery arises directly from the aortic arch. Vertebral arteries are patent without stenosis or dissection. Skeleton: No discrete or worrisome osseous  lesions. Moderate to advanced left-sided facet arthrosis noted at C3-4. Other neck: No other acute finding within the neck. Upper chest: Endotracheal and enteric tubes in place. Tip of the endotracheal tube appears to be low lying at and/or within the proximal right mainstem bronchus, although the tip is not included on this exam. Retraction by at least 1.5 cm recommended. Review of the MIP images confirms the above findings CTA HEAD FINDINGS Anterior circulation: Mild atheromatous change about the carotid siphons without hemodynamically significant stenosis or other abnormality. A1 segments patent bilaterally. Left A1 hypoplastic. Normal anterior communicating complex. Anterior cerebral arteries patent without stenosis. Normal in stenosis or occlusion. No proximal MCA branch occlusion or high-grade stenosis. Distal MCA branches perfused and symmetric. Posterior circulation: Both V4 segments patent without stenosis. Neither PICA origin well visualized. Basilar widely patent without stenosis. Superior cerebellar and posterior cerebral arteries patent bilaterally. Venous sinuses: None significant grossly patent allowing for timing the contrast bolus. Anatomic variants: As above. Focal 6 mm blush of contrast within the central aspect of the hematoma, concerning for a spot sign (series 5, image 275). No other underlying vascular abnormality is visible. No intracranial aneurysm. Review of the MIP images confirms the above findings IMPRESSION: 1. 6 mm blush of contrast within the central aspect of the brainstem hematoma, consistent with a spot sign/extravasation of contrast. No other visible vascular abnormality seen underlying the acute intracerebral hemorrhage. 2. Otherwise negative CTA of the head and neck. No other large vessel occlusion or other emergent finding. 3. Mild atheromatous change about the carotid bifurcations and carotid siphons without hemodynamically significant stenosis. 4. Tip of the  endotracheal tube  appears to be low lying at and/or within the proximal right mainstem bronchus, although the tip is not included on this exam. Retraction by approximately 1.5 cm recommended. Critical Value/emergent results were discussed by telephone at the time of interpretation on 07/24/2022 at approximately 12:30 a.m. to provider [facility]. Electronically Signed   By: Rise Mu M.D.   On: 07/24/2022 01:07   CT HEAD CODE STROKE WO CONTRAST  Result Date: 07/24/2022 CLINICAL DATA:  Code stroke. Initial evaluation for neuro deficit, stroke. EXAM: CT HEAD WITHOUT CONTRAST TECHNIQUE: Contiguous axial images were obtained from the base of the skull through the vertex without intravenous contrast. RADIATION DOSE REDUCTION: This exam was performed according to the departmental dose-optimization program which includes automated exposure control, adjustment of the mA and/or kV according to patient size and/or use of iterative reconstruction technique. COMPARISON:  None Available. FINDINGS: Brain: Cerebral volume within normal limits. Acute intracranial hemorrhage involving the brainstem/pons is seen. Hemorrhage measures 2.9 x 2.1 x 3.2 cm (estimated volume 10 mL). Intraventricular extension with blood in the adjacent third and fourth ventricles. No overt hydrocephalus. No frank transtentorial herniation. No other acute large vessel territory infarct. No visible mass lesion. No midline shift. No extra-axial fluid collection. Vascular: No abnormal hyperdense vessel. Skull: Scalp soft tissues and calvarium demonstrate no acute finding. Sinuses/Orbits: Globes orbital soft tissues within normal limits. Paranasal sinuses and mastoid air cells are largely clear. Other: None. ASPECTS [facility] Stroke Program Early CT Score) Does not apply, acute ICH. IMPRESSION: Acute intracranial hemorrhage involving the brainstem/pons, estimated volume 10 mL. Intraventricular extension with blood in the adjacent third and fourth ventricles. No  overt hydrocephalus or herniation at this time. Critical Value/emergent results were called by telephone at the time of interpretation on 07/24/2022 at 12:32 am to provider [facility] , who verbally acknowledged these results. Electronically Signed   By: Rise Mu M.D.   On: 07/24/2022 00:34    Vitals:   07/24/22 1000 07/24/22 1015 07/24/22 1030 07/24/22 1045  BP: 129/78 125/77 131/80 137/80  Pulse: 84 83 83 81  Resp: 20 20 20 20   Temp: (!) 100.6 F (38.1 C) (!) 100.8 F (38.2 C) (!) 100.9 F (38.3 C) (!) 100.9 F (38.3 C)  TempSrc:      SpO2: 97% 97% 98% 97%  Weight:      Height:         PHYSICAL EXAM General:  critically ill CV: Regular rate and rhythm on monitor Respiratory:  intubated GI: Abdomen soft and nontender   NEURO:  Patient is intubated, sedation off. Does not open eyes, with forced opening negative doll's eyes. Negative corneals, weak gag, positive cough. Triple flexion withdrawal to ble. No response to noxious stimuli in upper extremities.    ASSESSMENT/PLAN  Intracerebral Hemorrhage with brainstem compression: Etiology: Likely hypertensive emergency exacerbated by cocaine use Code Stroke CT head  Acute intracranial hemorrhage involving brainstem, estimated value 10 ml Intraventricular extension with blood in the adjacent third and fourth ventricles CTA head & neck  6 mm blush of contrast within the central aspect of the brainstem hematoma consistent with spot sign/contrast extravasation.  2D Echo: pending LDL 103 HgbA1c 6.5 VTE prophylaxis - SCDs No antithrombotic prior to admission, now on No antithrombotic due to ICH/IVH Therapy recommendations:  pending Disposition:  pending  Cerebral edema Mild hydrocephalus Mannitol bolus given 3% bolus given 3% running at 75 Na goal 150-155 Sodium this morning 131 ->147 Decrease 3% to 43ml/hr due to large  increase overnight Increase Na checks to Q4hr   Acute respiratory failure CCM  management, appreciate their assistance  Hypertension CHF Home meds: Coreg 6.25 twice daily, losartan 100 mg, amlodipine 10 mg, clonidine 0.1 mg twice daily. Restart amlodipine and losartan.  Cleviprex gtt. as needed, wean as able. Blood Pressure Goal: SBP less than 160   Hyperlipidemia Home meds: Simvastatin 80 mg LDL 103, goal < 70 Hold statin due to ICH Consider statin at discharge  Diabetes type II, uncontrolled Home meds: Metformin 500 daily HgbA1c 6.5, goal < 7.0 CBGs SSI Recommend close follow-up with PCP for better DM control  Tobacco Abuse Current smoker Unknown if ready to quit Nicotine replacement therapy provided  Substance Abuse Patient uses cocaine UDS positive for THC, benzodiazepines, cocaine Unknown if ready to quit [facility] consult for cessation placed  Dysphagia Patient has post-stroke dysphagia, SLP consulted    Diet   Diet NPO time specified   Advance diet as tolerated  Other Stroke Risk Factors ETOH use, alcohol level <10, advised to drink no more than 1-2 drink(s) a day Obesity, Body mass index is 29.19 kg/m., BMI >/= 30 associated with increased stroke risk, recommend weight loss, diet and exercise as appropriate  Obstructive sleep apnea  Other Active Problems Fever/Leukocytosis WBC 14.4 Tmax 101.5 ?central, start bromocriptine CXR 7/20 shows vascular congestion Repeat CXR 7/21 AM UA negative, repeat ordered Blood cultures ordered   Hospital day # 0   Pt seen by Neuro NP/APP and later by MD. Note/plan to be edited by MD as needed.    Lynnae January, DNP, AGACNP-BC [facility] Please use AMION for contact information & EPIC for messaging.  ATTENDING ATTESTATION:  52 year old with history of cocaine use now with a brainstem bleed that is devastating.  Prognosis is poor.  CT this morning at 630 was stable.  Sodium this morning 131 then went up to 147.  On 3% hypertonic saline.  On exam off sedation she had no corneal  reflex no blink to threat.  Unresponsive to commands.  Unreactive pupils.  Positive cough and gag.  No withdrawal in the upper or lower extremity.  Later in the morning she had fevers likely central.  Blood cultures and urinalysis was ordered.  Chest x-ray this morning was unremarkable.  Repeat tomorrow morning.  Started on bromocriptine.  Monitor sodiums more closely every 4 hours.  Palliative care consulted.  Her mom wants full measures.  Will try to arrange for family meeting.  Discussed with ICU- Dr. Katrinka Blazing and nurse.  Addendum: Later in the afternoon family arrived and we updated them at the bedside.  Her brother and niece are present as well as a close friend.  Discussed in detail and reviewed imaging with them.  They seem to be in disbelief that discussed this is a devastating stroke due to the location caused by cocaine and high blood pressure.  All her questions were answered.  Offered to consult with chaplain he declined.  Discussed DNR which they seem to be in favor of but will discuss the broader family before deciding.  Dr. Viviann Spare evaluated pt independently, reviewed imaging, chart, labs. Discussed and formulated plan with the Resident/APP. Changes were made to the note where appropriate. Please see APP/resident note above for details.      This patient is critically ill due to respiratory distress, hemorrhagic strok and at significant risk of neurological worsening, death form heart failure, respiratory failure, recurrent stroke, bleeding from [facility], seizure, sepsis. This patient's care requires constant  monitoring of vital signs, hemodynamics, respiratory and cardiac monitoring, review of multiple databases, neurological assessment, discussion with family, other specialists and medical decision making of high complexity. I spent 60 minutes of neurocritical care time in the care of this patient.   Kasiya Burck,MD   To contact Stroke Continuity provider, please refer to [email or web address]. After  hours, contact General Neurology
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
## 2022-07-24 NOTE — ED Provider Notes (Signed)
[facility] [employer] Emergency Department Provider Note MRN:  [id number]  Arrival date & time: 07/24/22     Chief Complaint   Altered mental status History of Present Illness   Beth Sanford is a 52 y.o. year-old female with a history of hypertension, CHF presenting to the ED with chief complaint of altered mental status.  EMS called for possible cardiac arrest, patient minimally responsive but with pulses.  Very hypertensive.  EMS noting small pupils on arrival, no response with Narcan.  Review of Systems  A thorough review of systems was obtained and all systems are negative except as noted in the HPI and PMH.   Patient's Health History    Past Medical History:  Diagnosis Date   Anxiety    CHF (congestive heart failure) (HCC)    Depression    High cholesterol    Hypertension    OSA (obstructive sleep apnea) 09/13/2014    Past Surgical History:  Procedure Laterality Date   BIOPSY  05/30/2019   Procedure: BIOPSY;  Surgeon: Malissa Hippo, MD;  Location: [facility] ENDO [facility];  Service: Endoscopy;;  esophagus   CHOLECYSTECTOMY     ELBOW SURGERY  left   ESOPHAGOGASTRODUODENOSCOPY N/A 05/30/2019   Procedure: ESOPHAGOGASTRODUODENOSCOPY (EGD);  Surgeon: Malissa Hippo, MD;  Location: [facility];  Service: Endoscopy;  Laterality: N/A;  150    Family History  Problem Relation Age of Onset   Alcohol abuse Father    Heart failure Father        deceased at age 84   Hypertension Brother    Bipolar disorder Brother    Emphysema Maternal Grandfather    Colon cancer Neg Hx     Social History   Socioeconomic History   Marital status: Single    Spouse name: Not on file   Number of children: Not on file   Years of education: Not on file   Highest education level: Not on file  Occupational History   Not on file  Tobacco Use   Smoking status: Some Days    Types: Cigarettes    Start date: 01/05/1984   Smokeless tobacco: Never   Tobacco comments:    2-3 cig week   Vaping Use   Vaping status: Never Used  Substance and Sexual Activity   Alcohol use: No    Alcohol/week: 0.0 standard drinks of alcohol   Drug use: No   Sexual activity: Not Currently    Birth control/protection: None  Other Topics Concern   Not on file  Social History Narrative   Not on file   Social Determinants of Health   Financial Resource Strain: Not on file  Food Insecurity: Not on file  Transportation Needs: Not on file  Physical Activity: Not on file  Stress: Not on file  Social Connections: Not on file  Intimate Partner Violence: Not on file     Physical Exam   Vitals:   07/24/22 0051 07/24/22 0054  BP: (!) 146/87 (!) 147/82  Pulse: (!) 148 96  Resp: 20 20  Temp: 98.3 F (36.8 C) 98.1 F (36.7 C)  SpO2: (!) 86% 93%    CONSTITUTIONAL: Ill-appearing, NAD NEURO/PSYCH: Eyes open, not responding to voice, minimal response to painful stimuli, seems that upper extremities extensor posture with noxious stimuli EYES:  eyes equal and reactive, small pupils ENT/NECK:  no LAD, no JVD CARDIO: Tachycardic rate, well-perfused, normal S1 and S2 PULM:  CTAB no wheezing or rhonchi GI/GU:  non-distended, non-tender MSK/SPINE:  No gross deformities, no edema SKIN:  no rash, atraumatic   *Additional and/or pertinent findings included in MDM below  Diagnostic and Interventional Summary    EKG Interpretation Date/Time:  Friday July 23 2022 23:58:25 EDT Ventricular Rate:  96 PR Interval:  152 QRS Duration:  86 QT Interval:  367 QTC Calculation: 464 R Axis:   1  Text Interpretation: Sinus rhythm Anterior infarct, old Borderline repolarization abnormality Confirmed by Kennis Carina [phone]) on 07/24/2022 1:01:12 AM       Labs Reviewed  CBC - Abnormal; Notable for the following components:      Result Value   WBC 14.4 (*)    RBC 5.50 (*)    Hemoglobin 17.2 (*)    HCT 51.4 (*)    All other components within normal limits  DIFFERENTIAL - Abnormal; Notable for the  following components:   Neutro Abs 8.7 (*)    Lymphs Abs 4.4 (*)    Monocytes Absolute 1.1 (*)    Abs Immature Granulocytes 0.08 (*)    All other components within normal limits  COMPREHENSIVE METABOLIC PANEL - Abnormal; Notable for the following components:   Sodium 131 (*)    Glucose, Bld 185 (*)    Calcium 8.6 (*)    Total Protein 8.5 (*)    All other components within normal limits  RAPID URINE DRUG SCREEN, HOSP PERFORMED - Abnormal; Notable for the following components:   Cocaine POSITIVE (*)    Benzodiazepines POSITIVE (*)    Tetrahydrocannabinol POSITIVE (*)    All other components within normal limits  BLOOD GAS, ARTERIAL - Abnormal; Notable for the following components:   pO2, Arterial 75 (*)    All other components within normal limits  ETHANOL  PROTIME-INR  APTT  URINALYSIS, ROUTINE W REFLEX MICROSCOPIC  I-STAT CHEM 8, ED  TROPONIN I (HIGH SENSITIVITY)    CT HEAD CODE STROKE WO CONTRAST  Final Result    CT ANGIO HEAD NECK W WO CM    (Results Pending)  DG Chest Port 1 View    (Results Pending)    Medications  propofol (DIPRIVAN) 1000 MG/100ML infusion (90 mcg/kg/min  72.4 kg Intravenous Rate/Dose Change 07/24/22 0056)  clevidipine (CLEVIPREX) infusion 0.5 mg/mL (20 mg/hr Intravenous Rate/Dose Change 07/24/22 0039)  mannitol 20 % infusion 75 g (75 g Intravenous New Bag/Given 07/24/22 0048)  sodium chloride 3% (hypertonic) IV bolus 250 mL (250 mLs Intravenous New Bag/Given 07/24/22 0044)  rocuronium bromide 100 MG/10ML SOSY (80 mg  Given 07/24/22 0002)  etomidate (AMIDATE) 2 MG/ML injection (20 mg  Given 07/24/22 0002)  sodium chloride 0.9 % bolus 1,000 mL (0 mLs Intravenous Stopped 07/24/22 0107)  iohexol (OMNIPAQUE) 350 MG/ML injection 75 mL (75 mLs Intravenous Contrast Given 07/24/22 0039)     Procedures  /  Critical Care .Critical Care  Performed by: Sabas Sous, MD Authorized by: Sabas Sous, MD   Critical care provider statement:    Critical care time  (minutes):  80   Critical care was necessary to treat or prevent imminent or life-threatening deterioration of the following conditions:  CNS failure or compromise   Critical care was time spent personally by me on the following activities:  Development of treatment plan with patient or surrogate, discussions with consultants, evaluation of patient's response to treatment, examination of patient, ordering and review of laboratory studies, ordering and review of radiographic studies, ordering and performing treatments and interventions, pulse oximetry, re-evaluation of patient's condition and review of old charts  Procedure Name: Intubation Date/Time: 07/24/2022 12:58 AM  Performed by: Sabas Sous, MDPre-anesthesia Checklist: Patient identified, Patient being monitored, Emergency Drugs available, Timeout performed and Suction available Oxygen Delivery Method: Non-rebreather mask Preoxygenation: Pre-oxygenation with 100% oxygen Induction Type: Rapid sequence Ventilation: Mask ventilation without difficulty Laryngoscope Size: Glidescope and 3 Grade View: Grade I Tube size: 7.0 mm Number of attempts: 1 Placement Confirmation: ETT inserted through vocal cords under direct vision, CO2 detector and Breath sounds checked- equal and bilateral Secured at: 23 cm Comments: RSI intubation with 20 mg etomidate, 80 mg rocuronium      ED Course and Medical Decision Making  Initial Impression and Ddx EMS painting the picture of overdose however patient had no response to Narcan, does not have any documented history of illicit drug use.  Some marijuana, alcohol found at the residence, gabapentin bottle of unclear significance.  Could simply be an overdose but other consideration includes intracranial bleeding, hemorrhagic stroke, brainstem stroke.  Patient is not protecting her airway on arrival, severely hypertensive, agonal breathing and self obstructing her breathing unless we provide her with chin lift.   Intubated for airway protection, awaiting CT imaging, teleneurology.  Will need further history from family.  Past medical/surgical history that increases complexity of ED encounter: Hypertension, recently noncompliant according to patient's mother.  Interpretation of Diagnostics I personally reviewed the EKG and my interpretation is as follows: Sinus rhythm  Labs overall reassuring with no significant blood count or electrolyte disturbance.  On my personal read/interpretation of the CT head without contrast there is concerning bleeding surrounding the brainstem.  Patient Reassessment and Ultimate Disposition/Management     I spoke with Dr. Iver Nestle of neurology as well as the teleneurology specialist here at [facility] regarding the patient's presentation and CT findings.  Very concerning location of the bleed.  Also spoke with Dr. Cheree Ditto of neurosurgery.  Unfortunately per Dr. Cheree Ditto there is no neurosurgical intervention available to her due to the location of the bleeding.  CTA demonstrating evidence of active bleeding.  Providing patient with mannitol, 3% saline, Cleviprex for blood pressure control, high-dose propofol.  Patient's mother is understandably in distress, spoke with her daughter just 2 or 3 hours ago on the phone during which daughter was acting completely normal, was on the way to the grocery store.  The prognosis for the patient appears to be quite poor but keeping patient here at [facility] for terminal extubation would be too abrupt for patient's family at this time.  Neurosurgery and teleneurology recommending neuro ICU.  Will transfer to [facility], Dr. Iver Nestle accepts patient for admission, going to the [facility] emergency department, Dr. Oletta Cohn is the accepting EDP.  Patient management required discussion with the following services or consulting groups:  Neurology and Neurosurgery  Complexity of Problems Addressed Acute illness or injury that poses threat of life of bodily  function  Additional Data Reviewed and Analyzed Further history obtained from: Further history from spouse/family member  Additional Factors Impacting ED Encounter Risk Consideration of hospitalization and Major procedures  Elmer Sow. Pilar Plate, MD [facility] Emergency Medicine [facility] [email or web address]  Final Clinical Impressions(s) / ED Diagnoses     ICD-10-CM   1. Hemorrhagic stroke [facility])  I61.9       ED Discharge Orders     None        Discharge Instructions Discussed with and Provided to Patient:   Discharge Instructions   None      Sabas Sous, MD 07/24/22  0107  

## 2022-07-24 NOTE — Progress Notes (Signed)
Seen and examined. Plan per Dr. Elsie Stain note today. Reach out if questions or concerns.  Myrla Halsted MD PCCM

## 2022-07-24 NOTE — Progress Notes (Signed)
Patient outstanding for overnight labs including 0600 sodium. Phlebotomy aware and will be at bedside shortly.

## 2022-07-24 NOTE — Progress Notes (Signed)
[place] at [employer].  Patient BIBA found unresponsive at home.  To CT at 0023, LKW 2100 per patient mother.  Monitoring for imaging, TS paged at 0023. EDMD at bedside.  TS on at 0026, patient positive for ICH  EDMD and Neurology speaking.  Patient back from CT at 0038. Thrombolytic decision at 0044.  NIHSS 0044 Patient appropriate for transfer per EDMD, Carelink activated at 0049.  Patient will ED to ED transfer.  Patient will be transferred to [facility].  Off [place] at 0052.

## 2022-07-24 NOTE — H&P (Addendum)
Neurology H&P  CC: Found unresponsive  History is obtained from: Chart review, no family available at time of arrival   HPI: Beth Sanford is a 52 y.o. female with a past medical history significant for hypertension, hyperlipidemia, BMI 29.19, obstructive sleep apnea, polysubstance abuse (tobacco, alcohol, cocaine, benzodiazepines, THC)  EMS was initially called for possible cardiac arrest.  She was found to have pulses but be minimally responsive and extremely hypertensive with pinpoint nonreactive pupils and no response to Narcan.  Head CT revealed intracerebral hemorrhage, without vascular abnormality on CTA.  Case was discussed with neurosurgery by Dr. Pilar Plate who did not feel the patient was an operative candidate.  However per Dr. Pilar Plate conversation with mom, she wanted all possible interventions and so patient was transferred to [facility] for medical management  LKW: 9 PM Thrombolytic given?: No, ICH IA performed?: No, ICH  ICH Score: 4 GCS: 3-4 is 2 points Infratentorial: Yes.  . If yes, 1 point Volume: <30cc is 0 points  Age: 52 y.o.. >80 is 1 point -- 0  Intraventricular extension is 1 point A Score of 4 points has a 30 day mortality of 97%. Stroke. 2001 Apr;32(4):891-7.  ROS: All other review of systems was negative except as noted in the HPI.   Past Medical History:  Diagnosis Date   Anxiety    CHF (congestive heart failure) (HCC)    Depression    High cholesterol    Hypertension    OSA (obstructive sleep apnea) 09/13/2014   Past Surgical History:  Procedure Laterality Date   BIOPSY  05/30/2019   Procedure: BIOPSY;  Surgeon: Malissa Hippo, MD;  Location: [facility];  Service: Endoscopy;;  esophagus   CHOLECYSTECTOMY     ELBOW SURGERY  left   ESOPHAGOGASTRODUODENOSCOPY N/A 05/30/2019   Procedure: ESOPHAGOGASTRODUODENOSCOPY (EGD);  Surgeon: Malissa Hippo, MD;  Location: [facility];  Service: Endoscopy;  Laterality: N/A;  150   Current Outpatient  Medications  Medication Instructions   allopurinol (ZYLOPRIM) 300 mg, Oral, Daily   atorvastatin (LIPITOR) 40 mg, Oral, Daily   carvedilol (COREG) 6.25 mg, Oral, 2 times daily with meals   colchicine 0.6 mg, Oral, Daily   escitalopram (LEXAPRO) 10 mg, Oral, Daily   irbesartan (AVAPRO) 300 mg, Oral, Daily   losartan (COZAAR) 100 mg, Oral, Daily   metFORMIN (GLUCOPHAGE XR) 500 mg, Oral, Daily with breakfast   nitroGLYCERIN (NITROSTAT) 0.4 mg, Sublingual, Every 5 min PRN   omeprazole (PRILOSEC) 40 MG capsule TAKE 1 Capsule BY MOUTH ONCE EVERY DAY  unable to confirm this medication list at this time   Family History  Problem Relation Age of Onset   Alcohol abuse Father    Heart failure Father        deceased at age 9   Hypertension Brother    Bipolar disorder Brother    Emphysema Maternal Grandfather    Colon cancer Neg Hx     Social History:  reports that she has been smoking cigarettes. She started smoking about 38 years ago. She has never used smokeless tobacco. She reports that she does not drink alcohol and does not use drugs.  UDS positive for as noted above in HPI   Exam: Current vital signs: BP (!) 140/80   Pulse 92   Temp (!) 96.9 F (36.1 C)   Resp 20   Ht 5\' 2"  (1.575 m)   Wt 72.4 kg   LMP 03/05/2018   SpO2 95%   BMI  29.19 kg/m  Vital signs in last 24 hours: Temp:  [96.9 F (36.1 C)-98.5 F (36.9 C)] 96.9 F (36.1 C) (07/20 0121) Pulse Rate:  [88-148] 92 (07/20 0121) Resp:  [12-23] 20 (07/20 0121) BP: (113-257)/(65-142) 140/80 (07/20 0121) SpO2:  [86 %-100 %] 95 % (07/20 0121) FiO2 (%):  [40 %] 40 % (07/20 0031) Weight:  [72.4 kg] 72.4 kg (07/20 0002)  Physical Exam  Constitutional: Appears well-developed and well-nourished.  Psych: Not interactive Eyes: Scleral edema is absent  HENT: ET tube in place MSK: no major joint deformities Cardiovascular: Normal rate and regular rhythm.  Respiratory: Triggering the ventilator  GI: Soft.  No distension.  There is no tenderness.     Neuro: Mental Status: Does not open eyes spontaneously, to voice or noxious stimulation Does not follow any commands Cranial Nerves: II: No blink to threat. Pupils are 1.5 mm bilaterally and nonreactive III,IV, VI/VIII: EOMI absent to VOR V/VII: Facial sensation is absent to corneal stimulation VIII: No response to voice X/XI: Intact cough, no gag XII: Unable to assess tongue protrusion secondary to patient's mental status  Motor/Sensory: Tone is normal. Bulk is normal.  Triple flexion in the bilateral lower extremities.  No movement of the bilateral upper extremities.  Occasional spontaneous movement of the bilateral lower extremities that appears most consistent with extensor posturing Deep Tendon Reflexes: Absent throughout  Plantars: Toes are briskly upgoing bilaterally Cerebellar: Unable to assess secondary to patient's mental status    NIHSS total 27  I have reviewed labs in epic and the results pertinent to this consultation are:  Basic Metabolic Panel: Recent Labs  Lab 07/24/22 0008  NA 131*  K 3.5  CL 98  CO2 22  GLUCOSE 185*  BUN 13  CREATININE 0.85  CALCIUM 8.6*    CBC: Recent Labs  Lab 07/24/22 0008  WBC 14.4*  NEUTROABS 8.7*  HGB 17.2*  HCT 51.4*  MCV 93.5  PLT 322    Coagulation Studies: Recent Labs    07/24/22 0008  LABPROT 12.3  INR 0.9    I have reviewed the images obtained:  Head CT personally reviewed, agree with radiology:   Acute intracranial hemorrhage involving the brainstem/pons, estimated volume 10 mL. Intraventricular extension with blood in the adjacent third and fourth ventricles. No overt hydrocephalus or herniation at this time On my personal review I do feel that there is some enlargement of the ventricles compared to last prior CT head however this is from 2015 and therefore could at least partially be explained by ex vacuo changes  CTA personally reviewed, agree with radiology:   1. 6  mm blush of contrast within the central aspect of the brainstem hematoma, consistent with a spot sign/extravasation of contrast. No other visible vascular abnormality seen underlying the acute intracerebral hemorrhage. 2. Otherwise negative CTA of the head and neck. No other large vessel occlusion or other emergent finding. 3. Mild atheromatous change about the carotid bifurcations and carotid siphons without hemodynamically significant stenosis. 4. Tip of the endotracheal tube appears to be low lying at and/or within the proximal right mainstem bronchus, although the tip is not included on this exam. Retraction by approximately 1.5 cm recommended.  Impression: Likely hypertensive ICH with intraventricular extension, brainstem compression, likely exacerbated by cocaine use, unknown medication adherence.  Patient at high risk of progressing to brain death however at this time she does have intact cough respiratory drive and we will continue maximal medical care  Recommendations: # Hemorrhagic stroke,  -  Stroke labs HgbA1c, fasting lipid panel - MRI brain w/ and w/o when stabilized to eval for underlying mass  - Stability scan in 6 hours - Frequent neuro checks, q1hr  - Echocardiogram - No antiplatelets due to ICH - DVT PPx heparin at 24 hrs if stable, SCDs for now - Risk factor modification - Telemetry monitoring;  - Blood pressure goal SBP 130-150 - S/p mannitol and 250 cc bolus of 3% in the [facility], continue 3% 75 cc an hour  -Given initial sodium of 131 will target sodium of 140-150 for now - PT consult, OT consult, Speech consult when patient stabilized  - Appreciate palliative care consultation  - Appreciate critical care management of ventilator and comorbidities - Stroke team to follow    Brooke Dare MD-PhD [facility] [phone] Available 7 PM to 7 AM, outside of these hours please [place] Neurologist on [place] as listed on Amion.    Total critical  care time: 40 minutes   Critical care time was exclusive of separately billable procedures and treating other patients.   Critical care was necessary to treat or prevent imminent or life-threatening deterioration.   Critical care was time spent personally by me on the following activities: development of treatment plan with patient and/or surrogate as well as nursing, discussions with consultants/primary team, evaluation of patient's response to treatment, examination of patient, obtaining history from patient or surrogate, ordering and performing treatments and interventions, ordering and review of laboratory studies, ordering and review of radiographic studies, and re-evaluation of patient's condition as needed, as documented above.

## 2022-07-24 NOTE — Progress Notes (Signed)
RT and RNx2 transported pt from 4N 24 to CT and then back to 4N24 without any complications

## 2022-07-24 NOTE — ED Notes (Signed)
NEUROSURGERY PAGED

## 2022-07-25 ENCOUNTER — Inpatient Hospital Stay (HOSPITAL_COMMUNITY): Payer: MEDICAID

## 2022-07-25 ENCOUNTER — Other Ambulatory Visit (HOSPITAL_COMMUNITY): Payer: MEDICAID

## 2022-07-25 DIAGNOSIS — Z7189 Other specified counseling: Secondary | ICD-10-CM | POA: Diagnosis not present

## 2022-07-25 DIAGNOSIS — Z66 Do not resuscitate: Secondary | ICD-10-CM | POA: Diagnosis not present

## 2022-07-25 DIAGNOSIS — I6389 Other cerebral infarction: Secondary | ICD-10-CM | POA: Diagnosis not present

## 2022-07-25 DIAGNOSIS — Z515 Encounter for palliative care: Secondary | ICD-10-CM | POA: Diagnosis not present

## 2022-07-25 DIAGNOSIS — I619 Nontraumatic intracerebral hemorrhage, unspecified: Secondary | ICD-10-CM | POA: Diagnosis not present

## 2022-07-25 LAB — ECHOCARDIOGRAM COMPLETE
Area-P 1/2: 1.19 cm2
Est EF: >75
Height: 62 in
S' Lateral: 1.9 cm
Weight: 2553.6 oz

## 2022-07-25 LAB — BASIC METABOLIC PANEL
Anion gap: 15 (ref 5–15)
BUN: 7 mg/dL (ref 6–20)
CO2: 33 mmol/L — ABNORMAL HIGH (ref 22–32)
Calcium: 9.1 mg/dL (ref 8.9–10.3)
Chloride: 112 mmol/L — ABNORMAL HIGH (ref 98–111)
Creatinine, Ser: 0.71 mg/dL (ref 0.44–1.00)
GFR, Estimated: >60 mL/min (ref >60)
Glucose, Bld: 129 mg/dL — ABNORMAL HIGH (ref 70–99)
Potassium: 3 mmol/L — ABNORMAL LOW (ref 3.5–5.1)
Sodium: 160 mmol/L — ABNORMAL HIGH (ref 135–145)

## 2022-07-25 LAB — CBC WITH DIFFERENTIAL/PLATELET
Abs Immature Granulocytes: 0.06 10*3/uL (ref 0.00–0.07)
Basophils Absolute: 0.1 10*3/uL (ref 0.0–0.1)
Basophils Relative: 0 %
Eosinophils Absolute: 0 10*3/uL (ref 0.0–0.5)
Eosinophils Relative: 0 %
HCT: 55.6 % — ABNORMAL HIGH (ref 36.0–46.0)
Hemoglobin: 16.9 g/dL — ABNORMAL HIGH (ref 12.0–15.0)
Immature Granulocytes: 0 %
Lymphocytes Relative: 14 %
Lymphs Abs: 2.1 10*3/uL (ref 0.7–4.0)
MCH: 31.3 pg (ref 26.0–34.0)
MCHC: 30.4 g/dL (ref 30.0–36.0)
MCV: 103 fL — ABNORMAL HIGH (ref 80.0–100.0)
Monocytes Absolute: 1.5 10*3/uL — ABNORMAL HIGH (ref 0.1–1.0)
Monocytes Relative: 10 %
Neutro Abs: 10.8 10*3/uL — ABNORMAL HIGH (ref 1.7–7.7)
Neutrophils Relative %: 76 %
Platelets: 251 10*3/uL (ref 150–400)
RBC: 5.4 MIL/uL — ABNORMAL HIGH (ref 3.87–5.11)
RDW: 13.6 % (ref 11.5–15.5)
WBC: 14.5 10*3/uL — ABNORMAL HIGH (ref 4.0–10.5)
nRBC: 0 % (ref 0.0–0.2)

## 2022-07-25 LAB — PHOSPHORUS: Phosphorus: 3.5 mg/dL (ref 2.5–4.6)

## 2022-07-25 LAB — MAGNESIUM: Magnesium: 2.6 mg/dL — ABNORMAL HIGH (ref 1.7–2.4)

## 2022-07-25 LAB — SODIUM
Sodium: 156 mmol/L — ABNORMAL HIGH (ref 135–145)
Sodium: 158 mmol/L — ABNORMAL HIGH (ref 135–145)

## 2022-07-25 LAB — TRIGLYCERIDES: Triglycerides: 284 mg/dL — ABNORMAL HIGH (ref <150)

## 2022-07-25 MED ORDER — ATROPINE SULFATE 0.4 MG/ML IV SOLN
0.2000 mg | Freq: Once | INTRAVENOUS | Status: DC
  Filled 2022-07-25: qty 0.5

## 2022-07-25 MED ORDER — BROMOCRIPTINE MESYLATE 2.5 MG PO TABS
2.5000 mg | ORAL_TABLET | Freq: Three times a day (TID) | ORAL | Status: DC
  Administered 2022-07-25 (×3): 2.5 mg
  Filled 2022-07-25 (×4): qty 1

## 2022-07-25 MED ORDER — FENTANYL CITRATE PF 50 MCG/ML IJ SOSY
50.0000 ug | PREFILLED_SYRINGE | INTRAMUSCULAR | Status: DC | PRN
  Administered 2022-07-25 – 2022-07-27 (×7): 50 ug via INTRAVENOUS
  Filled 2022-07-25 (×7): qty 1

## 2022-07-25 MED ORDER — POTASSIUM CHLORIDE 20 MEQ PO PACK
60.0000 meq | PACK | Freq: Once | ORAL | Status: AC
  Administered 2022-07-25: 60 meq
  Filled 2022-07-25: qty 3

## 2022-07-25 MED ORDER — FREE WATER
200.0000 mL | Status: DC
  Administered 2022-07-25 – 2022-07-26 (×6): 200 mL

## 2022-07-25 MED ORDER — FREE WATER
200.0000 mL | Freq: Four times a day (QID) | Status: DC
  Administered 2022-07-25: 200 mL

## 2022-07-25 MED ORDER — SODIUM CHLORIDE 0.9 % IV SOLN
2.0000 g | Freq: Three times a day (TID) | INTRAVENOUS | Status: DC
  Administered 2022-07-25 – 2022-07-27 (×7): 2 g via INTRAVENOUS
  Filled 2022-07-25 (×7): qty 12.5

## 2022-07-25 MED ORDER — ATROPINE SULFATE 1 MG/10ML IJ SOSY
PREFILLED_SYRINGE | INTRAMUSCULAR | Status: AC
  Filled 2022-07-25: qty 10

## 2022-07-25 MED ORDER — LOSARTAN POTASSIUM 50 MG PO TABS
100.0000 mg | ORAL_TABLET | Freq: Every day | ORAL | Status: DC
  Administered 2022-07-25 – 2022-07-27 (×3): 100 mg
  Filled 2022-07-25 (×3): qty 2

## 2022-07-25 NOTE — Progress Notes (Signed)
eLink Physician-Brief Progress Note Patient Name: Beth Sanford DOB: 01-Feb-1970 MRN: [id number]   Date of Service  07/25/2022  HPI/Events of Note  52 year old female with a history of polysubstance abuse who presented with out-of-hospital cardiac arrest and noted to have intracranial hemorrhage with intraventricular extension.  Nonsurgical patient.  Grim neurological prognosis.  Recently converted to DNR.  Ventilated, synchronous with the vent.  Intermittent hypertension and tachycardia.  eICU Interventions  Add on as needed fentanyl  Maintain Cleviprex as ordered.     Intervention Category Minor Interventions: Routine modifications to care plan (e.g. PRN medications for pain, fever)  Adarian Bur 07/25/2022, 9:44 PM

## 2022-07-25 NOTE — Progress Notes (Signed)
Pharmacy Antibiotic Note  Beth Sanford is a 53 y.o. female admitted on 07/30/2022. Pharmacy has been consulted for cefepime dosing. Tm up to 102.7 - unclear if infectious vs central fever cause. CXR w/ persistent patchy perihilar atelectasis. WBC up to 14.  Plan: Cefepime 2g IV q8 hours Monitor abx LOT, clinical improvement  Height: 5\' 2"  (157.5 cm) Weight: 72.4 kg (159 lb 9.6 oz) IBW/kg (Calculated) : 50.1  Temp (24hrs), Avg:100.7 F (38.2 C), Min:96.6 F (35.9 C), Max:102.7 F (39.3 C)  Recent Labs  Lab 07/24/22 0008 07/25/22 1037  WBC 14.4* 14.5*  CREATININE 0.85 0.71    Estimated Creatinine Clearance: 76.6 mL/min (by C-G formula based on SCr of 0.71 mg/dL).    Allergies  Allergen Reactions   Codeine Itching and Nausea Only   Tramadol Nausea Only    Antimicrobials this admission: Cefepime 7/21 >>   Dose adjustments this admission:  Microbiology results: 7/21 Sputum: ip  7/20 MRSA PCR: not detected 7/20 Bcx: ngtd  Thank you for allowing pharmacy to be a part of this patient's care.  Rexford Maus, PharmD, BCPS 07/25/2022 1:55 PM

## 2022-07-25 NOTE — Progress Notes (Addendum)
STROKE TEAM PROGRESS NOTE   BRIEF HPI [name] is a 52 y.o. female with history of hypertension, hyperlipidemia, BMI 29.19, obstructive sleep apnea, polysubstance abuse (tobacco, alcohol, cocaine, benzodiazepines, THC) presenting with minimal responsiveness and hypertensive emergency.  Patient was given Narcan by EMS due to pinpoint nonreactive pupils with no response. NIH 27.  Neurosurgery consulted, patient not a surgical candidate. LKW: 9 PM Thrombolytic given?: No, ICH IA performed?: No, ICH ICH Score: 4  SIGNIFICANT HOSPITAL EVENTS 7/20 early AM: Patient transferred to [facility] for medical management after head CT revealed acute ICH with IVH. Repeat CT shows unchanged large brainstem hemorrhage but increase in intraventricular clot. New mild hydrocephalus.  7/21 early AM: Patient began showing symptoms of herniation including hypertension, bradycardia and extensor posturing.    INTERIM HISTORY/SUBJECTIVE RN at bedside.  On exam patient is intubated.  No sedation.  Negative doll's eyes, negative corneals, positive cough, no gag, no pupillary response.  Patient now exhibits bilateral extensor posturing to noxious stimuli,, triple flexion in bilateral lower extremities.  3% hypertonic saline is running at 12.5, tapering per protocol..  Cleviprex has been weaned off.  Patient is on low-dose propofol due to vent dyssynchrony when off sedation.  Overnight Dr. Iver Nestle called patient's mother overnight and discussed the continued poor prognosis and progress in to brain death and urged mother to come into soon as possible.  Per notes, mother stated she may not she is not sure if she will make the family meeting set up by palliative care set for 4 PM today. Confirmed continued full scope of care.   Extensive discussion had with niece, Clarisa Fling over the phone this morning regarding change in neuro exam and vital signs and what progression towards herniation and brain death looks like. Discussed  that we are continuing full scope of care. She confirmed she will be here at 1600 for the family meeting and will do her best to get the patients mother here for that as well. Patient's brother and niece would like to make the patient a DNR, but patients mother is the decision maker and she has continued to request full scope of care.   OBJECTIVE  CBC    Component Value Date/Time   WBC 14.4 (H) 07/24/2022 0008   RBC 5.50 (H) 07/24/2022 0008   HGB 17.2 (H) 07/24/2022 0008   HCT 51.4 (H) 07/24/2022 0008   PLT 322 07/24/2022 0008   MCV 93.5 07/24/2022 0008   MCH 31.3 07/24/2022 0008   MCHC 33.5 07/24/2022 0008   RDW 12.5 07/24/2022 0008   LYMPHSABS 4.4 (H) 07/24/2022 0008   MONOABS 1.1 (H) 07/24/2022 0008   EOSABS 0.1 07/24/2022 0008   BASOSABS 0.1 07/24/2022 0008    BMET    Component Value Date/Time   NA 156 (H) 07/25/2022 0442   K 3.5 07/24/2022 0008   CL 98 07/24/2022 0008   CO2 22 07/24/2022 0008   GLUCOSE 185 (H) 07/24/2022 0008   BUN 13 07/24/2022 0008   CREATININE 0.85 07/24/2022 0008   CREATININE 0.72 01/27/2016 1432   CALCIUM 8.6 (L) 07/24/2022 0008   GFRNONAA >60 07/24/2022 0008    IMAGING past 24 hours DG CHEST PORT 1 VIEW  Result Date: 07/25/2022 CLINICAL DATA:  Fever and leukocytosis. EXAM: PORTABLE CHEST 1 VIEW COMPARISON:  Radiographs 07/24/2022 and 04/07/2020.  CT 09/22/2021. FINDINGS: 0728 hours. Two views submitted. Endotracheal tube tip is 3.1 cm above the carina. Enteric tube projects into the proximal stomach. Probable esophageal temperature  probe. The heart size and mediastinal contours are stable. Persistent low lung volumes with patchy perihilar atelectasis bilaterally. The aeration of the left lower lobe has improved from yesterday. No new airspace disease, pneumothorax or significant pleural effusion identified. The bones appear unchanged, without acute findings. IMPRESSION: 1. Improved aeration of the left lower lobe with persistent patchy perihilar  atelectasis. No new findings. 2. Stable support system. Electronically Signed   By: Carey Bullocks M.D.   On: 07/25/2022 09:16   CT HEAD WO CONTRAST ( )  Result Date: 07/24/2022 CLINICAL DATA:  Stroke, follow up EXAM: CT HEAD WITHOUT CONTRAST TECHNIQUE: Contiguous axial images were obtained from the base of the skull through the vertex without intravenous contrast. RADIATION DOSE REDUCTION: This exam was performed according to the departmental dose-optimization program which includes automated exposure control, adjustment of the mA and/or kV according to patient size and/or use of iterative reconstruction technique. COMPARISON:  CT Head 07/24/22 FINDINGS: Brain: No significant change in size of the parenchymal hemorrhage centered in the pons. Degree of intraventricular blood products is also similar to recent prior head CT. There is slight interval interval increase in size of the ventricular system, best seen at the level of the temporal horns. No CT evidence of acute cortical infarct. No mass effect. Vascular: No hyperdense vessel or unexpected calcification. Skull: Normal. Negative for fracture or focal lesion. Sinuses/Orbits: No middle ear or mastoid effusion. Paranasal sinuses are clear. Orbits are unremarkable. Other: None. IMPRESSION: 1. No significant change in size of the parenchymal hemorrhage centered in the pons. 2. Slight interval increase in size of the ventricular system, best seen at the level of the temporal horns. Electronically Signed   By: Lorenza Cambridge M.D.   On: 07/24/2022 15:17    Vitals:   07/25/22 0500 07/25/22 0600 07/25/22 0700 07/25/22 0800  BP: 126/82 (!) 149/91 (!) 140/85 (!) 147/87  Pulse: 62 70 73 78  Resp: 20 20 17 20   Temp: (!) 96.6 F (35.9 C) (!) 97.3 F (36.3 C) 98.4 F (36.9 C) 99.5 F (37.5 C)  TempSrc:    Bladder  SpO2: 98% 99% 99% 99%  Weight:      Height:         PHYSICAL EXAM General:  critically ill CV: Regular rate and rhythm on  monitor Respiratory:  intubated GI: Abdomen soft and nontender   NEURO:  Patient is intubated, sedation off. Does not open eyes, with forced opening negative doll's eyes.No pupillary response. Negative corneals, no gag, positive cough. Triple flexion withdrawal to ble. No response to noxious stimuli in upper extremities.    ASSESSMENT/PLAN  Intracerebral Hemorrhage with brainstem compression: Etiology: Likely hypertensive emergency exacerbated by cocaine use Code Stroke CT head  Acute intracranial hemorrhage involving brainstem, estimated value 10 ml Intraventricular extension with blood in the adjacent third and fourth ventricles CTA head & neck  6 mm blush of contrast within the central aspect of the brainstem hematoma consistent with spot sign/contrast extravasation. Repeat CT 7/20  2D Echo: pending LDL 103 HgbA1c 6.5 VTE prophylaxis - SCDs No antithrombotic prior to admission, now on No antithrombotic due to ICH/IVH Therapy recommendations:  pending Disposition:  pending  Cerebral edema Mild hydrocephalus Mannitol bolus given 3% bolus given 3% tapering off, down to 12.5 Na goal 150-155 currently, but may need to adjust to a WNL goal for further testing depending on CT scan results and any assessment changes.  Sodium levels 131 ->147 -> 155 -> -156 Will add free water flushes  Acute respiratory failure CCM management, appreciate their assistance Weaning propofol as able, on due to vent dyssynchrony CXR 7/20 shows vascular congestion Repeat CXR 7/21 shows patchy perihilar atelectasis Broad-spectrum atb ordered Cefepime pharmacy consult Resp culture ordered  Hypertension CHF Home meds: Coreg 6.25 twice daily, losartan 100 mg, amlodipine 10 mg, clonidine 0.1 mg twice daily. Restart amlodipine and losartan.  Cleviprex gtt. as needed, wean as able. Blood Pressure Goal: SBP less than 160   Hyperlipidemia Home meds: Simvastatin 80 mg LDL 103, goal < 70 Hold statin  due to ICH Consider statin at discharge  Diabetes type II, uncontrolled Home meds: Metformin 500 daily, held HgbA1c 6.5, goal < 7.0 CBGs SSI Recommend close follow-up with PCP for better DM control  Tobacco Abuse Current smoker Unknown if ready to quit Nicotine replacement therapy provided  Substance Abuse Patient uses cocaine UDS positive for THC, benzodiazepines, cocaine Unknown if ready to quit [facility] consult for cessation placed  Dysphagia Patient has post-stroke dysphagia, SLP consulted    Diet   Diet NPO time specified   Advance diet as tolerated  Other Stroke Risk Factors ETOH use, alcohol level <10, advised to drink no more than 1-2 drink(s) a day Obesity, Body mass index is 29.19 kg/m., BMI >/= 30 associated with increased stroke risk, recommend weight loss, diet and exercise as appropriate  Obstructive sleep apnea  Other Active Problems Fever/Leukocytosis WBC 14.4-> pending Tmax 102.7 ?central, bromocriptine increased to TID CXR 7/20 shows vascular congestion Repeat CXR 7/21 shows patchy perihilar atelectasis Broad-spectrum atb ordered Cefepime pharmacy consult UA negative, repeat negative Blood cultures negative prelim Resp culture ordered Hypokalemia 3.0, replace and check in AM  Hospital day # 1   Pt seen by Neuro NP/APP and later by MD. Note/plan to be edited by MD as needed.    Beth January, Beth Sanford, Beth Sanford [facility] Please use AMION for contact information & EPIC for messaging.  ATTENDING ATTESTATION:  52 year old with cocaine abuse brainstem intracranial hemorrhage with signs of herniation and posturing on exam.  No blink to threat, no reactive pupils, no oculocephalic movement.  No corneals.  Positive gag reflex.  Exam consistent with extremely poor prognosis and is nonsurvivable.  Addendum: Later in the afternoon family arrived.  They were updated on her condition.  Head CT was requested by family which is completed shows  unchanged intracranial hemorrhage.  Family has made her DNR but they have not transition to comfort care yet.  Will continue to give him time and space.  Chaplain was able to talk with the family.  Appreciate palliative care assistance.  Appreciate ICU assistance from Dr. Katrinka Blazing.  Dr. Viviann Spare evaluated pt independently, reviewed imaging, chart, labs. Discussed and formulated plan with the Resident/APP. Changes were made to the note where appropriate. Please see APP/resident note above for details.     This patient is critically ill due to respiratory distress, intracranial hemorrhage and at significant risk of neurological worsening, death form heart failure, respiratory failure, recurrent stroke, bleeding from [facility], seizure, sepsis. This patient's care requires constant monitoring of vital signs, hemodynamics, respiratory and cardiac monitoring, review of multiple databases, neurological assessment, discussion with family, other specialists and medical decision making of high complexity. I spent 60 minutes of neurocritical care time in the care of this patient.   Jaymeson Mengel,MD      To contact Stroke Continuity provider, please refer to [email or web address]. After hours, contact General Neurology

## 2022-07-25 NOTE — Progress Notes (Signed)
Patient becoming hypertensive, bradycardic to the 30's, noted to be  continuously posturing bilat upper and lower extremities even without stimuli. All other neuro assessments unchanged- no gag, no corneals, absent dolls eyes, pupils 2, equal, and non reactive. +cough. Neuro NP aware and at bedside. Orders obtained, EKG done, patient currently too unstable for CT scan.  Palliative NP, Beth Sanford, aware of above and contacted family, also obtained DNR status.  Family expected to be at bedside shortly.  CCM aware of events. Care team available for family meeting.

## 2022-07-25 NOTE — Plan of Care (Signed)
   Arrived at bedside after speaking with RN to assess patient due to increased hypertension and bradycardia, decreased assessment on exam.  On monitor patient is in bradycardic, irregular rhythm and rate.  Rate seen as low as 39, especially when patient is lying flat.  CT scan scheduled for 1400 canceled due to instability.  EKG ordered which showed sinus bradycardia with arrhythmias.   Sedation is off.  No pupillary response, no gag, very weak cough is compared to this morning's assessment.  Posturing continues in upper extremities, has increased in frequency since this morning, triple flexion and posturing continuing lower extremities, again more frequent then with this morning's assessment. While in the room RN spoke with palliative NP, who then called and spoke with patient's mother.  During this conversation patient's mother made the patient a DNR, order added by palliative. Family meeting still planned for 1600. Myself and Dr. Viviann Spare will be available as needed for further discussion with family.   Lynnae January, DNP, AGACNP-BC [facility] Please use AMION for contact information & EPIC for messaging.

## 2022-07-25 NOTE — IPAL (Signed)
Notified by nursing of bradycardia, hypertension  Head CT from 2 PM 1. No significant change in size of the parenchymal hemorrhage centered in the pons. 2. Slight interval increase in size of the ventricular system, best seen at the level of the temporal horns.  Suspect she is beginning to have herniation  Reviewed palliative care notes, noting patient is still full CODE STATUS.  Called mom to update her on the clinical situation.  When asked what the last updates she had received was she reported to me she had been at the patient's bedside and the patient was moving her legs in response to her touch which she felt meant to the patient was getting better.  We discussed that on my examination the patient was having trouble flexion movements which are spinal cord reflex and not a sign of brain function, and actually concerning movements.  We discussed that any neurosurgical intervention would be felt to create more harm than benefit.  We discussed that she is progressing despite maximal medical management including ventilatory support, blood pressure control, and hypertonic saline.  We discussed we are near the upper limit of benefit from hypertonic saline and that I expect that she will progress to brain death.  She expressed that she hoped the patient's brain may recover if we continue to support her breathing and heart.  I explained to her that once the patient is brain-dead they are no longer alive.  She asked if we could delay any extubation until she saw her again.  When I asked when she might come back to the hospital she reported she is not sure if she will make the 4 PM family meeting set up by palliative care today, because she is very upset and worried about her daughter.  Informed her that if she is very upset and worried about her daughter it would be best for her to come back to the hospital as quickly as possible because I am concerned her daughter is actively dying.  She reported I had  answered all of her questions and expressed understanding of what I had told her but continued to state that she wanted all possible medical support.  Brooke Dare MD-PhD [facility] [phone] Available 7 AM to 7 PM, outside these hours please contact Neurologist on [place] listed on AMION   10 minutes were spent in this conversation

## 2022-07-25 NOTE — Progress Notes (Signed)
RT assisted with patient transport to CT and returned to 4N24 without complications.

## 2022-07-25 NOTE — Progress Notes (Signed)
07/25/2022  Actively herniating Family at bedside I think any further escalation at this point is medically futile and prolonging suffering. Family agrees to DNR.  They are not ready to transition to comfort at this time. Further discussions per neurology.  Myrla Halsted MD PCCM

## 2022-07-25 NOTE — Progress Notes (Signed)
   07/25/22 1657  Spiritual Encounters  Type of Visit Initial  Care provided to: Family  Conversation partners present during encounter Nurse;Physician  Referral source Physician  Reason for visit End-of-life  OnCall Visit Yes  Spiritual Framework  Presenting Themes Values and beliefs;Significant life change;Impactful experiences and emotions  Values/beliefs Belief in Jesus  Community/Connection Family  Patient Stress Factors Health changes  Family Stress Factors Family relationships;Loss;Major life changes  Interventions  Spiritual Care Interventions Made Established relationship of care and support;Compassionate presence;Reflective listening;Normalization of emotions;Explored values/beliefs/practices/strengths;Bereavement/grief support;Prayer;Supported grief process  Intervention Outcomes  Outcomes Connection to spiritual care;Reduced anxiety;Reduced fear;Reduced isolation;Awareness of support   Chaplain was paged by physician to support patient's daughter. The doctor was discussing the patient's prognosis when chaplain arrived. The patient's mother was argumentative with the patient's daughter. Daughter is supported by her adoptive family as well as the patient's niece. There are complicated family dynamics.   Arlyce Dice, Chaplain Resident [phone]

## 2022-07-25 NOTE — Progress Notes (Signed)
  Echocardiogram 2D Echocardiogram has been performed.  Milda Smart 07/25/2022, 8:46 AM

## 2022-07-25 NOTE — Progress Notes (Addendum)
Palliative Medicine Inpatient Follow Up Note HPI: Beth [facility] is a 52 y.o. female with a past medical history significant for hypertension, hyperlipidemia, BMI 29.19, obstructive sleep apnea, polysubstance abuse (tobacco, alcohol, cocaine, benzodiazepines, THC). 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 Case was reportedly d/w neurosx who did not feel the pt was a surgical candidate however family desires full code and thus pt was transferred to [facility] for further monitoring.    The palliative care team has been asked to get involved in the setting of a large intracranial hemorrhage to further discuss goals of care.  Today's Discussion 07/25/2022  *Please note that this is a verbal dictation therefore any spelling or grammatical errors are due to the "Dragon Medical One" system interpretation.  Chart reviewed inclusive of vital signs, progress notes, laboratory results, and diagnostic images.   I spoke this morning with patients RN, Rosann Auerbach who shares a variety of concerns in the setting of patients clinical status. She observes posturing and poor thermoregulation in the setting of patient neurological injury.  Upon assessment patient is noted to be on propofol and hypertonic saline. Not responsive.   I called patients mother and left a HIPAA compliant VM to follow up on her conversation with Dr. Iver Nestle.   I called patients niece, Joice Lofts, We had a discussion about patient current state and her worsening exam. I shared openly and honestly that the outcomes no matter what direction is going to be very poor. Amber is very understanding of this reality. She is hopeful that her mother will be able to come in to participate in additional conversations this evening.  ________________________________ Addendum:  I spoke with patients niece Hailee as her daughter plans to visit today. We discussed groomiinh Beth [facility]ford to provide dignity as family and friends visit her.   ________________________________ Addendum #2:  Per patients RN, Jannetta Quint is posturing without stimulation and bradying down to the 40's.  I was able to Ducey patients niece, Haylee and share the concern that patient is progressing and may not be with [place] for very long. She shares that she plans to reach out to other family members and she will try to come sooner than later. She also shares that patients daughter should be coming as well.  I called patients mother, [name]. She was very distraught when I called her. We reviewed that Beth [facility]ford sadly continues to clinically decline. I shared that I worry she is not long for this world. I shared that given patients signs there is truly only harm to come from a resuscitative effort if it were to be made at this time. I shared it was not something that would be of utility. Patients mother vocalizes how in shock she is by all of this. I shared understanding that this is an absolutely awful situation. Provided empathetic support through listening. She shares that she is going to try to come in though she is unable to move due to her grief. I reviewed that it is understandable to be mentally, emotionally, and physically paralyzed when losing your child. She was thankful for the phone Thorpe I summarized that patient will now be a DNAR which her mother understood. [name]   I was able to Frame and speak to patients niece [facility] doctor and provide updates as well. She shares she has multiple family members that she will need to pick up and then she plans to be on her. She is reasonable emotional over the phone. _______________________________________ Addendum #3  Family meeting held this evening ay 1540:  Family members present: Patients brother, Dorene Sorrow, mother [name], Niece, Tylene Fantasia, and daughter  Providers Present: Dr. Katrinka Blazing, Hetty Blend, NP, Dr. Viviann Spare, Raymon Mutton, RN, and myself  The neurology APP, Denny Peon shared with patients family information related to  patients clinical changes throughout the day. Review of patient's present state as a result of brainstem hemorrhage was held.  Dr. Katrinka Blazing was very clear identifying this is an unfortunate stroke and with that being said recovery is unlikely as a survivability.  He shared if Beth [facility]ford were to survive this then the rest of her life would be her living in a vegetative state dependent upon care by other people.  Dr. Katrinka Blazing again broached the topic of CODE STATUS and why in these instances resuscitation is not of great utility or impact.  He shared the option of causing greater trauma or allowing her for a natural passing on present measures.  Patient's mother Rayfield Citizen did understand this and shared she would be open to again a DO NOT RESUSCITATE but is not at this point in a place where she would want to withdraw any of the present care measures.  Dr. Katrinka Blazing went on to say if her condition should worsen it would not be of great benefit to start her on additional medications as in essence her body would be declaring itself.  Created space and opportunity for patients family to explore thoughts feelings and fears regarding Beth [facility]ford's current medical situation.  Patient's son Dorene Sorrow asked a variety of questions about prognosis and what to expect.  Both he and his mother stated assertively that they desire another CT scan to better evaluate the bleeding.  The medical team stated that they would pursue this despite the possibility patient could pass away on the CT scan table.  The temperature of the conversation increased and a few of the members of the meeting were asked to politely step out.  Nursing was able to take [facility] to the CT scan.  I spent time speaking to Beth [facility]'s mother and observing the difficulties that she is facing at this time losing her only daughter.  She provided insight on their complicated family dynamics.  Provided support through empathy and therapeutic listening.  Goals at this time are to  continue current care, to not pursue resuscitation should patient pass naturally, and to not escalate care should Ernesteen further deteriorate.  Questions and concerns addressed/Palliative Support Provided.   Objective Assessment: Vital Signs Vitals:   07/25/22 0900 07/25/22 1000  BP: (!) 165/74 (!) 147/87  Pulse: (!) 57 83  Resp: 20 20  Temp: (!) 100.4 F (38 C) (!) 101.3 F (38.5 C)  SpO2: 97% 98%    Intake/Output Summary (Last 24 hours) at 07/25/2022 1108 Last data filed at 07/25/2022 1000 Gross per 24 hour  Intake 930.95 ml  Output 1040 ml  Net -109.05 ml   Last Weight  Most recent update: 07/24/2022 12:03 AM    Weight  72.4 kg (159 lb 9.6 oz)            Gen:  Middle aged Caucasian F in NAD HEENT:  ETT, drymucous membranes CV: Regular rate and rhythm  PULM: On mechanical ventilator ABD: soft/nontender EXT: (+) edema Neuro: Somnolent, not arousable  SUMMARY OF RECOMMENDATIONS   DNAR  Continue present scope of care though if deteriorating patients family aware additional of medications and aggressive measures will not impact the outcome -  All providers have been open and honest about  the likelihood of Arelyn passing away on present measures   Ongoing PMT support  Time Spent:168 Billing based on MDM: High  Problems Addressed: One acute or chronic illness or injury that poses a threat to life or bodily function  Amount and/or Complexity of Data: Category 2:Independent interpretation of a test performed by another physician/other qualified health care professional (not separately reported)  Risks: Decision regarding hospitalization or escalation of [facility] care and Decision not to resuscitate or to de-escalate care because of poor prognosis ______________________________________________________________________________________ Lamarr Lulas [facility] Palliative Medicine Team Team Cell Phone: [phone] Please utilize secure chat with additional  questions, if there is no response within 30 minutes please Oakley the above phone number  Palliative Medicine Team providers are available by phone from 7am to 7pm daily and can be reached through the team cell phone.  Should this patient require assistance outside of these hours, please Burrows the patient's attending physician.

## 2022-07-25 NOTE — Progress Notes (Signed)
   [name], MRN:  [id number], DOB:  1970-09-10, LOS: 1 ADMISSION DATE:  07/15/2022, CONSULTATION DATE:  07/24/22 REFERRING MD:  Dr Iver Nestle, CHIEF COMPLAINT:  brainstem hemorrage, resp failure   History of Present Illness:  17 yofemale with pmh drug abuse, htn, hyperlipidemia, osa found down and ems called. Pt retained pulses but was minimally responsive and htn-I've. 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 Case was reportedly d/w neurosx who did not feel the pt was a surgical candidate however family desires full code and thus pt was transferred to [facility] for further monitoring.   All history is obtained from chart and neuro as pt is unresponsive on vent.   CCM has been consulted for vent management  Pertinent  Medical History  Drug use Htn hyperlipidemia  Significant Hospital Events: Including procedures, antibiotic start and stop dates in addition to other pertinent events   Presented to [facility] with weakness Transferred to [facility] 2/2 large hemorrhage with extension into ventricles 7/20  Interim History / Subjective:  Some cushingnoid response overnight.  Objective   Blood pressure (!) 140/85, pulse 73, temperature 98.4 F (36.9 C), resp. rate 17, height 5\' 2"  (1.575 m), weight 72.4 kg, last menstrual period 03/05/2018, SpO2 99%.    Vent Mode: PRVC FiO2 (%):  [40 %] 40 % Set Rate:  [20 bmp] 20 bmp Vt Set:  [410 mL] 410 mL PEEP:  [8 cmH20] 8 cmH20 Plateau Pressure:  [15 cmH20-18 cmH20] 18 cmH20   Intake/Output Summary (Last 24 hours) at 07/25/2022 0820 Last data filed at 07/25/2022 0700 Gross per 24 hour  Intake 1149.65 ml  Output 2105 ml  Net -955.35 ml   Filed Weights   07/24/22 0002  Weight: 72.4 kg    Examination: Comatose +cough Pupils pinpoint Extensor response only LE to pain Triggers vent Lungs thick secretions Heart sounds regular  On hypertonic CT head  reviewed CXR some patchy infiltrates on R  Resolved Hospital Problem list     Assessment & Plan:  Cocaine abuse 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, herniation, probable developing hydrocephalus- do not think survivable Resp failure due to above  - Continue vent support - Medical ICP interventions per neuro - Continue family discussions, approaching futility; if she does not progress to brain death she will be in a persistent vegetative state - Will follow  Best Practice (right click and "Reselect all SmartList Selections" daily)   Diet/type: NPO DVT prophylaxis: SCD GI prophylaxis: PPI Lines: N/A Foley:  Yes, and it is still needed Code Status:  full code Last date of multidisciplinary goals of care discussion [per primary]  31 min cc time Myrla Halsted MD PCCM

## 2022-07-26 DIAGNOSIS — I619 Nontraumatic intracerebral hemorrhage, unspecified: Secondary | ICD-10-CM | POA: Diagnosis not present

## 2022-07-26 DIAGNOSIS — E440 Moderate protein-calorie malnutrition: Secondary | ICD-10-CM | POA: Insufficient documentation

## 2022-07-26 LAB — CBC WITH DIFFERENTIAL/PLATELET
Abs Immature Granulocytes: 0.12 10*3/uL — ABNORMAL HIGH (ref 0.00–0.07)
Basophils Absolute: 0.1 10*3/uL (ref 0.0–0.1)
Basophils Relative: 0 %
Eosinophils Absolute: 0 10*3/uL (ref 0.0–0.5)
Eosinophils Relative: 0 %
HCT: 50.7 % — ABNORMAL HIGH (ref 36.0–46.0)
Hemoglobin: 15.4 g/dL — ABNORMAL HIGH (ref 12.0–15.0)
Immature Granulocytes: 1 %
Lymphocytes Relative: 18 %
Lymphs Abs: 3.5 10*3/uL (ref 0.7–4.0)
MCH: 31.1 pg (ref 26.0–34.0)
MCHC: 30.4 g/dL (ref 30.0–36.0)
MCV: 102.4 fL — ABNORMAL HIGH (ref 80.0–100.0)
Monocytes Absolute: 2.3 10*3/uL — ABNORMAL HIGH (ref 0.1–1.0)
Monocytes Relative: 12 %
Neutro Abs: 13.9 10*3/uL — ABNORMAL HIGH (ref 1.7–7.7)
Neutrophils Relative %: 69 %
Platelets: 218 10*3/uL (ref 150–400)
RBC: 4.95 MIL/uL (ref 3.87–5.11)
RDW: 13.5 % (ref 11.5–15.5)
WBC: 19.8 10*3/uL — ABNORMAL HIGH (ref 4.0–10.5)
nRBC: 0 % (ref 0.0–0.2)

## 2022-07-26 LAB — BASIC METABOLIC PANEL
Anion gap: 10 (ref 5–15)
BUN: 13 mg/dL (ref 6–20)
CO2: 32 mmol/L (ref 22–32)
Calcium: 9 mg/dL (ref 8.9–10.3)
Chloride: 115 mmol/L — ABNORMAL HIGH (ref 98–111)
Creatinine, Ser: 0.78 mg/dL (ref 0.44–1.00)
GFR, Estimated: >60 mL/min (ref >60)
Glucose, Bld: 121 mg/dL — ABNORMAL HIGH (ref 70–99)
Potassium: 3.1 mmol/L — ABNORMAL LOW (ref 3.5–5.1)
Sodium: 157 mmol/L — ABNORMAL HIGH (ref 135–145)

## 2022-07-26 LAB — MAGNESIUM
Magnesium: 2.6 mg/dL — ABNORMAL HIGH (ref 1.7–2.4)
Magnesium: 2.4 mg/dL (ref 1.7–2.4)

## 2022-07-26 LAB — PHOSPHORUS
Phosphorus: 2.1 mg/dL — ABNORMAL LOW (ref 2.5–4.6)
Phosphorus: 2.3 mg/dL — ABNORMAL LOW (ref 2.5–4.6)

## 2022-07-26 LAB — CULTURE, RESPIRATORY W GRAM STAIN

## 2022-07-26 LAB — CULTURE, BLOOD (ROUTINE X 2): Culture: NO GROWTH

## 2022-07-26 LAB — SODIUM: Sodium: 158 mmol/L — ABNORMAL HIGH (ref 135–145)

## 2022-07-26 MED ORDER — FREE WATER
400.0000 mL | Status: DC
  Administered 2022-07-26 – 2022-07-28 (×11): 400 mL

## 2022-07-26 MED ORDER — HYDRALAZINE HCL 20 MG/ML IJ SOLN
10.0000 mg | INTRAMUSCULAR | Status: DC | PRN
  Administered 2022-07-26: 10 mg via INTRAVENOUS
  Filled 2022-07-26: qty 1

## 2022-07-26 MED ORDER — ORAL CARE MOUTH RINSE: 15.0000 mL | OROMUCOSAL | Status: DC | PRN

## 2022-07-26 MED ORDER — POLYETHYLENE GLYCOL 3350 17 G PO PACK
17.0000 g | PACK | Freq: Every day | ORAL | Status: DC
  Administered 2022-07-26 – 2022-07-27 (×2): 17 g
  Filled 2022-07-26 (×2): qty 1

## 2022-07-26 MED ORDER — ARTIFICIAL TEARS OPHTHALMIC OINT
TOPICAL_OINTMENT | OPHTHALMIC | Status: DC | PRN
  Filled 2022-07-26: qty 3.5

## 2022-07-26 MED ORDER — HYDRALAZINE HCL 20 MG/ML IJ SOLN
20.0000 mg | INTRAMUSCULAR | Status: DC | PRN
  Administered 2022-07-26 – 2022-07-27 (×3): 20 mg via INTRAVENOUS
  Filled 2022-07-26 (×3): qty 1

## 2022-07-26 MED ORDER — BROMOCRIPTINE MESYLATE 2.5 MG PO TABS
5.0000 mg | ORAL_TABLET | Freq: Three times a day (TID) | ORAL | Status: DC
  Administered 2022-07-26 – 2022-07-27 (×6): 5 mg
  Filled 2022-07-26 (×9): qty 2

## 2022-07-26 MED ORDER — CLONIDINE HCL 0.1 MG PO TABS
0.1000 mg | ORAL_TABLET | Freq: Two times a day (BID) | ORAL | Status: DC
  Administered 2022-07-26 – 2022-07-27 (×3): 0.1 mg
  Filled 2022-07-26 (×3): qty 1

## 2022-07-26 MED ORDER — CLONIDINE HCL 0.1 MG PO TABS: 0.1000 mg | ORAL_TABLET | Freq: Two times a day (BID) | ORAL | Status: DC

## 2022-07-26 MED ORDER — POTASSIUM CHLORIDE 20 MEQ PO PACK
60.0000 meq | PACK | Freq: Once | ORAL | Status: AC
  Administered 2022-07-26: 60 meq
  Filled 2022-07-26: qty 3

## 2022-07-26 MED ORDER — VITAL AF 1.2 CAL PO LIQD
1000.0000 mL | ORAL | Status: DC
  Administered 2022-07-26: 1000 mL

## 2022-07-26 MED ORDER — ORAL CARE MOUTH RINSE
15.0000 mL | OROMUCOSAL | Status: DC
  Administered 2022-07-26 – 2022-07-28 (×26): 15 mL via OROMUCOSAL

## 2022-07-26 NOTE — Progress Notes (Signed)
NAMECatalyna Reilly Sanford, MRN:  [id number], DOB:  1970/08/29, LOS: 2 ADMISSION DATE:  07/05/2022, CONSULTATION DATE:  07/24/22 REFERRING MD:  Dr Iver Nestle, CHIEF COMPLAINT:  brainstem hemorrage, resp failure   History of Present Illness:  71 yofemale with pmh drug abuse, htn, hyperlipidemia, osa found down and ems called. Pt retained pulses but was minimally responsive and htn-I've. 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 Case was reportedly d/w neurosx who did not feel the pt was a surgical candidate however family desires full code and thus pt was transferred to [facility] for further monitoring.   All history is obtained from chart and neuro as pt is unresponsive on vent.   CCM has been consulted for vent management  Pertinent  Medical History  Drug use Htn hyperlipidemia  Significant Hospital Events: Including procedures, antibiotic start and stop dates in addition to other pertinent events   Presented to [facility] with weakness Transferred to [facility] 2/2 large hemorrhage with extension into ventricles 7/20  Interim History / Subjective:  Neuro exam very poor, brain stem appears intact on exam  Objective   Blood pressure 119/76, pulse 68, temperature 99.5 F (37.5 C), temperature source Axillary, resp. rate 17, height 5\' 2"  (1.575 m), weight 72.4 kg, last menstrual period 03/05/2018, SpO2 97%.    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  Iatrogenic hypernatremia: Due to hypertonic saline -- Free water  Hypertension: -- Continue home amlodipine, losartan -- Bradycardic so unable to start carvedilol home dose -- Resume low-dose home clonidine 0.1 twice daily, suspect we will need to increase -- As needed IV antihypertensives SBP goal less than 160  Fever: Suspect central. -- Chest x-ray is clear, continue cefepime for now await speciation from lower respiratory culture -- Continue bromocriptine  Best Practice (right click and "Reselect all SmartList Selections" daily)   Diet/type: NPO DVT prophylaxis: SCD GI prophylaxis: PPI Lines: N/A Foley:  Yes, and it is still needed Code Status:  full code Last date of multidisciplinary goals of care discussion [per primary]  CRITICAL CARE Performed by: Lesia Sago Sayda Grable   Total critical care time: 40 minutes  Critical care time was exclusive of separately billable procedures and treating other patients.  Critical care was necessary to treat or prevent imminent or life-threatening deterioration.  Critical care was time spent personally by me on the following activities: development of  treatment plan with patient and/or surrogate as well as nursing, discussions with consultants, evaluation of patient's response to treatment, examination of patient, obtaining history from patient or surrogate, ordering and  performing treatments and interventions, ordering and review of laboratory studies, ordering and review of radiographic studies, pulse oximetry and re-evaluation of patient's condition.  Karren Burly, MD
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
## 2022-07-26 NOTE — Progress Notes (Addendum)
STROKE TEAM PROGRESS NOTE   BRIEF HPI [name] is a 52 y.o. female with history of hypertension, hyperlipidemia, BMI 29.19, obstructive sleep apnea, polysubstance abuse (tobacco, alcohol, cocaine, benzodiazepines, THC) presenting with minimal responsiveness and hypertensive emergency.  Patient was given Narcan by EMS due to pinpoint nonreactive pupils with no response. NIH 27.  Neurosurgery consulted, patient not a surgical candidate. LKW: 9 PM Thrombolytic given?: No, ICH IA performed?: No, ICH ICH Score: 4  SIGNIFICANT HOSPITAL EVENTS 7/20 early AM: Patient transferred to [facility] for medical management after head CT revealed acute ICH with IVH. Repeat CT shows unchanged large brainstem hemorrhage but increase in intraventricular clot. New mild hydrocephalus.  7/21 early AM: Patient began showing symptoms of herniation including hypertension, bradycardia and extensor posturing.   7/21 - hypertonic saline discontinued at 1149  INTERIM HISTORY/SUBJECTIVE RN at bedside.  On exam patient is intubated.  No sedation.  Negative doll's eyes, negative corneals, positive cough, no gag, no pupillary response.  Patient now exhibits bilateral extensor posturing to noxious stimuli,, triple flexion in bilateral lower extremities.    OBJECTIVE  CBC    Component Value Date/Time   WBC 14.5 (H) 07/25/2022 1037   RBC 5.40 (H) 07/25/2022 1037   HGB 16.9 (H) 07/25/2022 1037   HCT 55.6 (H) 07/25/2022 1037   PLT 251 07/25/2022 1037   MCV 103.0 (H) 07/25/2022 1037   MCH 31.3 07/25/2022 1037   MCHC 30.4 07/25/2022 1037   RDW 13.6 07/25/2022 1037   LYMPHSABS 2.1 07/25/2022 1037   MONOABS 1.5 (H) 07/25/2022 1037   EOSABS 0.0 07/25/2022 1037   BASOSABS 0.1 07/25/2022 1037    BMET    Component Value Date/Time   NA 158 (H) 07/25/2022 1841   K 3.0 (L) 07/25/2022 1037   CL 112 (H) 07/25/2022 1037   CO2 33 (H) 07/25/2022 1037   GLUCOSE 129 (H) 07/25/2022 1037   BUN 7 07/25/2022 1037    CREATININE 0.71 07/25/2022 1037   CREATININE 0.72 01/27/2016 1432   CALCIUM 9.1 07/25/2022 1037   GFRNONAA >60 07/25/2022 1037    IMAGING past 24 hours CT HEAD WO CONTRAST ( )  Result Date: 07/25/2022 CLINICAL DATA:  Stroke, follow up. EXAM: CT HEAD WITHOUT CONTRAST TECHNIQUE: Contiguous axial images were obtained from the base of the skull through the vertex without intravenous contrast. RADIATION DOSE REDUCTION: This exam was performed according to the departmental dose-optimization program which includes automated exposure control, adjustment of the mA and/or kV according to patient size and/or use of iterative reconstruction technique. COMPARISON:  Head CT 07/24/2022 FINDINGS: Brain: The large parenchymal hemorrhage involving the pons bilaterally with extension superiorly into the midbrain has not significantly changed in size. Intraventricular extension is again noted with persistent hemorrhage in the lateral, third, and fourth ventricles. The overall volume of intraventricular hemorrhage has mildly decreased from the prior study. Mild ventriculomegaly is unchanged. No acute infarct or new hemorrhage is identified. There is no midline shift or herniation. Vascular: Mild calcific atherosclerosis at the skull base. Skull: No acute fracture or suspicious osseous lesion. Sinuses/Orbits: Paranasal sinuses and mastoid air cells are clear. Unremarkable orbits. Other: None. IMPRESSION: 1. Unchanged brainstem hemorrhage with mildly decreased intraventricular hemorrhage. 2. Unchanged mild hydrocephalus. Electronically Signed   By: Sebastian Ache M.D.   On: 07/25/2022 17:42   ECHOCARDIOGRAM COMPLETE  Result Date: 07/25/2022    ECHOCARDIOGRAM REPORT   Patient Name:   Beth Sanford Date of Exam: 07/25/2022 Medical Rec #:  [id number]  Height:       62.0 in Accession #:    [id number]    Weight:       159.6 lb Date of Birth:  [birth year]     BSA:          1.737 m Patient Age:    51 years      BP:           154/91  mmHg Patient Gender: F             HR:           79 bpm. Exam Location:  Inpatient Procedure: 2D Echo, Cardiac Doppler and Color Doppler Indications:    Stroke  History:        Patient has prior history of Echocardiogram examinations, most                 recent 09/11/2019. CHF; Risk Factors:Sleep Apnea, Hypertension and                 Dyslipidemia. Polysubstance abuse.  Sonographer:    Milda Smart Referring Phys: [id number] SRISHTI L BHAGAT  Sonographer Comments: Echo performed with patient supine and on artificial respirator and patient is obese. Image acquisition challenging due to respiratory motion. IMPRESSIONS  1. Intracavitary gradient of 3.5 m/s likely related to hyperdynamic LV function.  2. Left ventricular ejection fraction, by estimation, is >75%. The left ventricle has hyperdynamic function. The left ventricle has no regional wall motion abnormalities. There is mild left ventricular hypertrophy of the basal-septal segment. Left ventricular diastolic parameters are consistent with Grade I diastolic dysfunction (impaired relaxation). Elevated left atrial pressure.  3. Right ventricular systolic function is normal. The right ventricular size is normal.  4. The mitral valve is normal in structure. Mild mitral valve regurgitation. No evidence of mitral stenosis.  5. The aortic valve is tricuspid. Aortic valve regurgitation is not visualized. No aortic stenosis is present.  6. The inferior vena cava is normal in size with greater than 50% respiratory variability, suggesting right atrial pressure of 3 mmHg. FINDINGS  Left Ventricle: Left ventricular ejection fraction, by estimation, is >75%. The left ventricle has hyperdynamic function. The left ventricle has no regional wall motion abnormalities. The left ventricular internal cavity size was normal in size. There is mild left ventricular hypertrophy of the basal-septal segment. Left ventricular diastolic parameters are consistent with Grade I diastolic  dysfunction (impaired relaxation). Elevated left atrial pressure. Right Ventricle: The right ventricular size is normal. Right ventricular systolic function is normal. Left Atrium: Left atrial size was normal in size. Right Atrium: Right atrial size was normal in size. Pericardium: There is no evidence of pericardial effusion. Mitral Valve: The mitral valve is normal in structure. Mild mitral valve regurgitation. No evidence of mitral valve stenosis. MV peak gradient, 6.4 mmHg. The mean mitral valve gradient is 4.0 mmHg. Tricuspid Valve: The tricuspid valve is normal in structure. Tricuspid valve regurgitation is not demonstrated. No evidence of tricuspid stenosis. Aortic Valve: The aortic valve is tricuspid. Aortic valve regurgitation is not visualized. No aortic stenosis is present. Pulmonic Valve: The pulmonic valve was normal in structure. Pulmonic valve regurgitation is not visualized. No evidence of pulmonic stenosis. Aorta: The aortic root is normal in size and structure. Venous: The inferior vena cava is normal in size with greater than 50% respiratory variability, suggesting right atrial pressure of 3 mmHg. IAS/Shunts: No atrial level shunt detected by color flow Doppler. Additional Comments: Intracavitary gradient of 3.5 m/s likely related  to hyperdynamic LV function.  LEFT VENTRICLE PLAX 2D LVIDd:         3.70 cm   Diastology LVIDs:         1.90 cm   LV e' medial:    4.24 cm/s LV PW:         1.10 cm   LV E/e' medial:  15.0 LV IVS:        1.30 cm   LV e' lateral:   8.70 cm/s LVOT diam:     2.00 cm   LV E/e' lateral: 7.3 LVOT Area:     3.14 cm  RIGHT VENTRICLE             IVC RV S prime:     17.60 cm/s  IVC diam: 1.80 cm TAPSE (M-mode): 2.0 cm LEFT ATRIUM             Index        RIGHT ATRIUM          Index LA diam:        3.40 cm 1.96 cm/m   RA Area:     9.49 cm LA Vol (A2C):   25.8 ml 14.85 ml/m  RA Volume:   16.70 ml 9.61 ml/m LA Vol (A4C):   17.3 ml 9.96 ml/m LA Biplane Vol: 22.3 ml 12.84 ml/m    AORTA Ao Root diam: 2.70 cm Ao Asc diam:  3.70 cm MITRAL VALVE MV Area (PHT): 1.19 cm     SHUNTS MV Peak grad:  6.4 mmHg     Systemic Diam: 2.00 cm MV Mean grad:  4.0 mmHg MV Vmax:       1.26 m/s MV Vmean:      95.5 cm/s MV Decel Time: 640 msec MV E velocity: 63.70 cm/s MV A velocity: 101.00 cm/s MV E/A ratio:  0.63 Olga Millers MD Electronically signed by Olga Millers MD Signature Date/Time: 07/25/2022/10:52:03 AM    Final     Vitals:   07/26/22 0345 07/26/22 0400 07/26/22 0500 07/26/22 0600  BP:  133/70 (!) 135/100 (!) 165/76  Pulse:  (!) 56 86 (!) 57  Resp:  20 15 20   Temp:  (!) 101.7 F (38.7 C)    TempSrc:  Bladder    SpO2: 99% 97% 96% 98%  Weight:      Height:         PHYSICAL EXAM General:  critically ill CV: Irregular, atrial fibrillation  Respiratory:  intubated GI: Abdomen soft and nontender   NEURO:  Patient is intubated, sedation off. Does not open eyes, with forced opening negative doll's eyes. No pupillary response. Negative corneals, positive gag, positive cough. Triple flexion withdrawal to BLE. Extensor posturing with noxious stimuli to the left upper extremity. No movement in right UE.   ASSESSMENT/PLAN  Intracerebral Hemorrhage with brainstem compression: Etiology: Likely hypertensive emergency exacerbated by cocaine use Code Stroke CT head  Acute intracranial hemorrhage involving brainstem, estimated value 10 ml Intraventricular extension with blood in the adjacent third and fourth ventricles CTA head & neck  6 mm blush of contrast within the central aspect of the brainstem hematoma consistent with spot sign/contrast extravasation. Repeat CT 7/20  2D Echo: pending LDL 103 HgbA1c 6.5 VTE prophylaxis - SCDs No antithrombotic prior to admission, now on No antithrombotic due to ICH/IVH Therapy recommendations:  pending Disposition:  pending  Cerebral edema Mild hydrocephalus Mannitol bolus given 3% bolus given 3% d/c'd 7/21 Na goal 150-155  currently, but may need to adjust to a  WNL goal for further testing depending on CT scan results and any assessment changes.  Sodium levels 131 ->147 -> 155 -> -156 -> 160 -> 158 Will add free water flushes  Acute respiratory failure CCM management, appreciate their assistance Weaning propofol as able, on due to vent dyssynchrony CXR 7/20 shows vascular congestion Repeat CXR 7/21 shows patchy perihilar atelectasis Broad-spectrum atb ordered Cefepime pharmacy consult Resp culture ordered  Hypertension CHF Home meds: Coreg 6.25 twice daily, losartan 100 mg, amlodipine 10 mg, clonidine 0.1 mg twice daily. Restart amlodipine and losartan.  Cleviprex gtt. as needed, wean as able. Blood Pressure Goal: SBP less than 160   Hyperlipidemia Home meds: Simvastatin 80 mg LDL 103, goal < 70 Hold statin due to ICH Consider statin at discharge  Diabetes type II, uncontrolled Home meds: Metformin 500 daily, held HgbA1c 6.5, goal < 7.0 CBGs SSI Recommend close follow-up with PCP for better DM control  Tobacco Abuse Current smoker Unknown if ready to quit Nicotine replacement therapy provided  Substance Abuse Patient uses cocaine UDS positive for THC, benzodiazepines, cocaine Unknown if ready to quit [facility] consult for cessation placed  Dysphagia Patient has post-stroke dysphagia, SLP consulted    Diet   Diet NPO time specified   Advance diet as tolerated  Other Stroke Risk Factors ETOH use, alcohol level <10, advised to drink no more than 1-2 drink(s) a day Obesity, Body mass index is 29.19 kg/m., BMI >/= 30 associated with increased stroke risk, recommend weight loss, diet and exercise as appropriate  Obstructive sleep apnea  Other Active Problems Fever/Leukocytosis WBC 14.4-> pending Tmax 102.7 ?central, bromocriptine increased to TID CXR 7/20 shows vascular congestion Repeat CXR 7/21 shows patchy perihilar atelectasis Broad-spectrum atb ordered Cefepime pharmacy  consult UA negative, repeat negative Blood cultures negative prelim Resp culture ordered Hypokalemia 7/21 [facility]  Patient seen and examined by NP/APP with MD. MD to update note as needed.   Elmer Picker, DNP, FNP-BC [facility] Pager: [phone]  ATTENDING ATTESTATION:   52 year old with cocaine abuse brainstem intracranial hemorrhage with signs of herniation and posturing on exam.  No blink to threat, no reactive pupils, no oculocephalic movement.  No corneals.  Positive gag reflex.  Exam consistent with extremely poor prognosis and is not survivable.   Attempted to contact family but no answer.  Continue ongoing measures, add free water.  Potassium replaced today.  On antibiotics.   Dr. Viviann Spare evaluated pt independently, reviewed imaging, chart, labs. Discussed and formulated plan with the Resident/APP. Changes were made to the note where appropriate. Please see APP/resident note above for details.     This patient is critically ill due to respiratory distress, intracranial hemorrhage and at significant risk of neurological worsening, death form heart failure, respiratory failure, recurrent stroke, bleeding from [facility], seizure, sepsis. This patient's care requires constant monitoring of vital signs, hemodynamics, respiratory and cardiac monitoring, review of multiple databases, neurological assessment, discussion with family, other specialists and medical decision making of high complexity. I spent 60 minutes of neurocritical care time in the care of this patient.    Chanequa Spees,MD       To contact Stroke Continuity provider, please refer to [email or web address]. After hours, contact General Neurology

## 2022-07-26 NOTE — Progress Notes (Signed)
Patient ID: Beth Sanford, female   DOB: [birth year], 52 y.o.   MRN: [id number]  Had a long discussion with the patient's daughter mother and 2 other close friends that were in the room at bedside.  Updated them on her condition and prognosis.  Discussed that she has posturing in both her arms and triple flexion in both her legs.  She still has a cough and gag reflex.  Reiterated poor prognosis.  ICU nurse was present intermittently during the discussion.  All questions were answered to their satisfaction.  Continue supportive care.  Approximate time: 20-25 minutes

## 2022-07-26 NOTE — Progress Notes (Signed)
No urine output for the shift. Bladder scanned patient at 0330. RN attempted to irrigate foley. Due to increased pressure while flushing, foley seal broke. RN replaced foley.

## 2022-07-26 NOTE — Progress Notes (Signed)
Initial Nutrition Assessment  DOCUMENTATION CODES:   Non-severe (moderate) malnutrition in context of social or environmental circumstances  INTERVENTION:   Initiate tube feeding via OG tube: Vital AF 1.2 at 20 ml/h and increase by 10 ml every 8 hours to goal rate of 60 ml/hr (1440 ml per day)  Provides 1728 kcal, 108 gm protein, 1167 ml free water daily   400 ml free water every 4 hours Total free water: 3567 ml   Monitor magnesium and phosphorus every 12 hours x 4 occurrences, MD to replete as needed, as pt is at risk for refeeding syndrome given pt meets criteria for moderate malnutrition.   NUTRITION DIAGNOSIS:   Moderate Malnutrition related to social / environmental circumstances (polysubstance abuse) as evidenced by moderate muscle depletion, moderate fat depletion.  GOAL:   Patient will meet greater than or equal to 90% of their needs  MONITOR:   TF tolerance  REASON FOR ASSESSMENT:   Consult Enteral/tube feeding initiation and management  ASSESSMENT:   Pt with PMH of HTN, HLD, OSA, polysubstance abuse (tobacco, ETOH, cocaine, benzodiazepines, THC) admitted with acute ICH with IVH due to hypertensive emergency exacerbated by cocaine use.   Pt discussed during ICU rounds and with RN. Reached out to MD will start TF today.  Per charting today pt with no gag or pupillary response  07/20 - tx to [facility] for management of acute ICH with IVH 07/21 - pt showing signs of early herniation  Medications reviewed and include: protonix, miralax, senokot-s  Off cleviprex and propofol   Labs reviewed:  Na 157 K 3.1  16 F OG tube; projects into proximal stomach per chest xray   NUTRITION - FOCUSED PHYSICAL EXAM:  Flowsheet Row Most Recent Value  Orbital Region No depletion  Upper Arm Region Severe depletion  Thoracic and Lumbar Region Moderate depletion  Buccal Region Unable to assess  Temple Region Mild depletion  Clavicle Bone Region Mild depletion  Clavicle and  Acromion Bone Region Moderate depletion  Scapular Bone Region Unable to assess  Dorsal Hand No depletion  Patellar Region Moderate depletion  Anterior Thigh Region Moderate depletion  Posterior Calf Region Moderate depletion  Edema (RD Assessment) None  Hair Reviewed  Eyes Unable to assess  Mouth Unable to assess  Skin Reviewed  Nails Unable to assess  [painted]       Diet Order:   Diet Order             Diet NPO time specified  Diet effective now                   EDUCATION NEEDS:   Not appropriate for education at this time  Skin:  Skin Assessment: Reviewed RN Assessment  Last BM:  unknown  Height:   Ht Readings from Last 1 Encounters:  07/24/22 5\' 2"  (1.575 m)    Weight:   Wt Readings from Last 1 Encounters:  07/24/22 72.4 kg    BMI:  Body mass index is 29.19 kg/m.  Estimated Nutritional Needs:   Kcal:  1700-1900  Protein:  95-115 grams  Fluid:  >1.7 L/day  Cammy Copa., RD, LDN, CNSC See AMiON for contact information

## 2022-07-26 NOTE — Progress Notes (Signed)
Palliative Medicine Note  Called patient's niece, Joice Lofts, to see if family had made a decision regarding GOC. Amber states that patient's mother doesn't understand patient's condition. Mother stated to [facility] that ,"They're just trying to kill her." Hospital doctor and patient's brother will be up to see patient this afternoon.  Barrie Folk MS Ed.S, RN Palliative Medicine Team  Team Phone: [phone]

## 2022-07-27 ENCOUNTER — Inpatient Hospital Stay (HOSPITAL_COMMUNITY): Payer: MEDICAID

## 2022-07-27 DIAGNOSIS — Z515 Encounter for palliative care: Secondary | ICD-10-CM

## 2022-07-27 DIAGNOSIS — Z7189 Other specified counseling: Secondary | ICD-10-CM | POA: Diagnosis not present

## 2022-07-27 DIAGNOSIS — J9600 Acute respiratory failure, unspecified whether with hypoxia or hypercapnia: Secondary | ICD-10-CM | POA: Diagnosis not present

## 2022-07-27 DIAGNOSIS — I619 Nontraumatic intracerebral hemorrhage, unspecified: Secondary | ICD-10-CM | POA: Diagnosis not present

## 2022-07-27 LAB — BASIC METABOLIC PANEL
Anion gap: 8 (ref 5–15)
BUN: 17 mg/dL (ref 6–20)
CO2: 33 mmol/L — ABNORMAL HIGH (ref 22–32)
Calcium: 9.2 mg/dL (ref 8.9–10.3)
Chloride: 113 mmol/L — ABNORMAL HIGH (ref 98–111)
Creatinine, Ser: 0.65 mg/dL (ref 0.44–1.00)
GFR, Estimated: >60 mL/min (ref >60)
Glucose, Bld: 138 mg/dL — ABNORMAL HIGH (ref 70–99)
Potassium: 3.3 mmol/L — ABNORMAL LOW (ref 3.5–5.1)
Sodium: 154 mmol/L — ABNORMAL HIGH (ref 135–145)

## 2022-07-27 LAB — CBC WITH DIFFERENTIAL/PLATELET
Abs Immature Granulocytes: 0.06 10*3/uL (ref 0.00–0.07)
Basophils Absolute: 0.1 10*3/uL (ref 0.0–0.1)
Basophils Relative: 0 %
Eosinophils Absolute: 0 10*3/uL (ref 0.0–0.5)
Eosinophils Relative: 0 %
HCT: 49.2 % — ABNORMAL HIGH (ref 36.0–46.0)
Hemoglobin: 14.9 g/dL (ref 12.0–15.0)
Immature Granulocytes: 0 %
Lymphocytes Relative: 17 %
Lymphs Abs: 2.8 10*3/uL (ref 0.7–4.0)
MCH: 30.8 pg (ref 26.0–34.0)
MCHC: 30.3 g/dL (ref 30.0–36.0)
MCV: 101.7 fL — ABNORMAL HIGH (ref 80.0–100.0)
Monocytes Absolute: 1.4 10*3/uL — ABNORMAL HIGH (ref 0.1–1.0)
Monocytes Relative: 8 %
Neutro Abs: 12.7 10*3/uL — ABNORMAL HIGH (ref 1.7–7.7)
Neutrophils Relative %: 75 %
Platelets: 216 10*3/uL (ref 150–400)
RBC: 4.84 MIL/uL (ref 3.87–5.11)
RDW: 13.7 % (ref 11.5–15.5)
WBC: 17.1 10*3/uL — ABNORMAL HIGH (ref 4.0–10.5)
nRBC: 0 % (ref 0.0–0.2)

## 2022-07-27 LAB — CULTURE, BLOOD (ROUTINE X 2)
Special Requests: ADEQUATE
Special Requests: ADEQUATE

## 2022-07-27 LAB — MAGNESIUM
Magnesium: 2.5 mg/dL — ABNORMAL HIGH (ref 1.7–2.4)
Magnesium: 2.3 mg/dL (ref 1.7–2.4)

## 2022-07-27 LAB — PHOSPHORUS
Phosphorus: 4.1 mg/dL (ref 2.5–4.6)
Phosphorus: 3.3 mg/dL (ref 2.5–4.6)

## 2022-07-27 LAB — GLUCOSE, CAPILLARY
Glucose-Capillary: 163 mg/dL — ABNORMAL HIGH (ref 70–99)
Glucose-Capillary: 171 mg/dL — ABNORMAL HIGH (ref 70–99)
Glucose-Capillary: 184 mg/dL — ABNORMAL HIGH (ref 70–99)
Glucose-Capillary: 225 mg/dL — ABNORMAL HIGH (ref 70–99)

## 2022-07-27 LAB — CULTURE, RESPIRATORY W GRAM STAIN

## 2022-07-27 LAB — SODIUM
Sodium: 153 mmol/L — ABNORMAL HIGH (ref 135–145)
Sodium: 149 mmol/L — ABNORMAL HIGH (ref 135–145)

## 2022-07-27 MED ORDER — ATORVASTATIN CALCIUM 40 MG PO TABS: 40.0000 mg | ORAL_TABLET | Freq: Every day | ORAL | Status: DC

## 2022-07-27 MED ORDER — POTASSIUM CHLORIDE 20 MEQ PO PACK
40.0000 meq | PACK | Freq: Once | ORAL | Status: AC
  Administered 2022-07-27: 40 meq
  Filled 2022-07-27: qty 2

## 2022-07-27 MED ORDER — HYDROCHLOROTHIAZIDE 12.5 MG PO TABS
12.5000 mg | ORAL_TABLET | Freq: Every day | ORAL | Status: DC
  Administered 2022-07-27: 12.5 mg
  Filled 2022-07-27: qty 1

## 2022-07-27 MED ORDER — ONDANSETRON HCL 4 MG/2ML IJ SOLN
4.0000 mg | Freq: Four times a day (QID) | INTRAMUSCULAR | Status: DC | PRN
  Administered 2022-07-27: 4 mg via INTRAVENOUS
  Filled 2022-07-27: qty 2

## 2022-07-27 MED ORDER — CLONIDINE HCL 0.1 MG PO TABS
0.1000 mg | ORAL_TABLET | Freq: Three times a day (TID) | ORAL | Status: DC
  Administered 2022-07-27 (×2): 0.1 mg
  Filled 2022-07-27 (×2): qty 1

## 2022-07-27 MED ORDER — LABETALOL HCL 5 MG/ML IV SOLN
20.0000 mg | INTRAVENOUS | Status: DC | PRN
  Administered 2022-07-27: 20 mg via INTRAVENOUS
  Filled 2022-07-27: qty 4

## 2022-07-27 MED ORDER — INSULIN ASPART 100 UNIT/ML IJ SOLN
0.0000 [IU] | INTRAMUSCULAR | Status: DC
  Administered 2022-07-27 (×2): 1 [IU] via SUBCUTANEOUS
  Administered 2022-07-27: 2 [IU] via SUBCUTANEOUS
  Administered 2022-07-27 – 2022-07-28 (×3): 1 [IU] via SUBCUTANEOUS

## 2022-07-27 MED ORDER — HYDROCHLOROTHIAZIDE 10 MG/ML ORAL SUSPENSION: 12.5000 mg | Freq: Every day | ORAL | Status: DC

## 2022-07-27 MED ORDER — PENICILLIN G POTASSIUM 20000000 UNITS IJ SOLR
12.0000 10*6.[IU] | Freq: Two times a day (BID) | INTRAVENOUS | Status: DC
  Administered 2022-07-27: 12 10*6.[IU] via INTRAVENOUS
  Filled 2022-07-27 (×3): qty 12

## 2022-07-27 NOTE — Progress Notes (Signed)
Patient transported on ventilator from 4N24 to MRI and back with no complications noted.

## 2022-07-27 NOTE — Progress Notes (Signed)
Patient had a large episode of emesis.  NP Suzie Portela with CCM was alerted.  Tube feeds were stopped.

## 2022-07-27 NOTE — Progress Notes (Signed)
Palliative:  HPI:  52 y.o. female with a past medical history significant for hypertension, hyperlipidemia, BMI 29.19, obstructive sleep apnea, polysubstance abuse (tobacco, alcohol, cocaine, benzodiazepines, THC). 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 Case was reportedly d/w neurosx who did not feel the pt was a surgical candidate however family desires full code and thus pt was transferred to [facility] for further monitoring. The palliative care team has been asked to get involved in the setting of a large intracranial hemorrhage to further discuss goals of care.  I met today at [facility] bedside when mother, Beth Sanford, arrived. I have previously discussed with RN Weston Brass, Dr. Judeth Horn, and Dr. Viviann Spare. Dr. Viviann Spare discussed with niece, Beth Sanford, and family about devastating MRI brain results. I reviewed with Beth Sanford status. Beth Sanford talks about the struggles of knowing that her child will die before she will. She talks of concern for Beth Sanford's dogs and how to manage and what to do with her home. She tells me how Beth Sanford has always been joyful and childlike and seemed to never want to truly grow up. She adores her niece, Beth Sanford. Beth Sanford speaks of Beth Sanford's struggles and difficulties in life. Beth Sanford voices understanding of poor prognosis and acknowledges that Beth Sanford is not expected to survive. We reviewed ventilator settings and vital signs.   We discussed further about Beth Sanford and if we are helping her by continuing ventilator support. We reviewed faith and the fact that if a miracle occurs and is meant to be this can occur off ventilator as well. We reviewed that other family have expressed that maintaining Beth Sanford on ventilator/life support may not be best for her. We discussed that these machines/tubes can be very uncomfortable to patients who are able to tell us so. Beth Sanford is more accepting of poor prognosis today but naturally struggling with the idea of liberating Beth Sanford from  ventilator. I encouraged her to speak more with her family and think/pray about this decision. She tells me that Beth Sanford has been talking to her about end of life and stating that she did not believe she would live much longer and shared that she would want to be cremated. I let Beth Sanford know that I will follow up with her and family tomorrow after they have more time to discuss as a family. Beth Sanford expressed her concern that people think that Beth Sanford did something purposeful to die - I reassured her that this is not at all what anyone is thinking.   All questions/concerns addressed. Emotional support provided.   Exam: Unresponsive. Flushed. No blinking, no reactive pupils. Does not move or follow commands. + Gag reflex. Tolerating vent 40% FiO2. Breathing regular, unlabored on vent. Abd soft. Warm to touch.   Plan: - DNR in place.  - Family considering liberation from ventilator but no decisions made.  - Mother is understandably struggling with path forward. Will try and involve chaplain for support tomorrow during her visit.   50 min  Beth Channel, NP Palliative Medicine Team Pager [phone] (Please see [email or web address] for schedule) Team Phone [phone]    Greater than 50%  of this time was spent counseling and coordinating care related to the above assessment and plan

## 2022-07-27 NOTE — Progress Notes (Addendum)
NAMEAndria Head Sanford, MRN:  [id number], DOB:  [birth year], LOS: 3 ADMISSION DATE:  17-Aug-2022, CONSULTATION DATE:  07/24/22 REFERRING MD:  Dr Iver Nestle, CHIEF COMPLAINT:  brainstem hemorrage, resp failure   History of Present Illness:  52 yo female with pmh drug abuse, htn, hld, osa found down on Aug 17, 2022 and ems called. Pt retained pulses but was minimally responsive and hypertensive. 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 Case was reportedly d/w neurosx who did not feel the pt was a surgical candidate however family desires full code and thus pt was transferred to [facility] for further monitoring.   All history is obtained from chart and neuro as pt is unresponsive on vent.   CCM has been consulted for vent management  Pertinent  Medical History  Drug use Htn Hyperlipidemia OSA  Significant Hospital Events: Including procedures, antibiotic start and stop dates in addition to other pertinent events   Presented to [facility] with weakness Transferred to [facility] 2/2 large hemorrhage with extension into ventricles 7/20  Interim History / Subjective:  SBP range 123-170 Intubated on mech vent Pupils pinpoint, no corneal, cough/gag present, no withdrawal to pain in uppers but some withdrawal ble  Objective   Blood pressure (!) 154/80, pulse 67, temperature 99.7 F (37.6 C), temperature source Esophageal, resp. rate 20, height 5\' 2"  (1.575 m), weight 72.4 kg, last menstrual period 03/05/2018, SpO2 98%.    Vent Mode: PRVC FiO2 (%):  [40 %] 40 % Set Rate:  [20 bmp] 20 bmp Vt Set:  [410 mL] 410 mL PEEP:  [5 cmH20-8 cmH20] 5 cmH20 Plateau Pressure:  [14 cmH20-16 cmH20] 15 cmH20   Intake/Output Summary (Last 24 hours) at 07/27/2022 0940 Last data filed at 07/27/2022 0800 Gross per 24 hour  Intake 2944.23 ml  Output 715 ml  Net 2229.23 ml   Filed Weights   07/24/22 0002  Weight: 72.4 kg     Examination: General:  critically ill appearing on mech vent HEENT: MM pink/moist; ETT in place Neuro: Pupils pinpoint, no corneal, cough/gag present, no withdrawal to pain in uppers but some extensor response to BLE CV: s1s2, brady 50-60s, no m/r/g PULM:  dim clear BS bilaterally; on mech vent PRVC GI: soft, bsx4 active  Extremities: warm/dry, no edema  Skin: no rashes or lesions appreciated   Resolved Hospital Problem list     Assessment & Plan:  ICH w/ brainstem compression -likely HTN emergency triggered by cocaine use Cerebral Edema Mild hydrocephalus Plan: -neuro following; appreciate recs -MRI this am -frequent neuro checks -limit sedating meds -SBP goal <160 per neuro -statin per neuro -off hypertonic saline; Na 153 this am slowly trending down; cont free water -Continue neuroprotective measures- normothermia, euglycemia, HOB greater than 30, head in neutral alignment, normocapnia, normoxia.   Acute respiratory failure in seeting of severe encephalopathy: due to neurologic devastation in the setting of brainstem bleed Plan: -mental status precludes extubation -LTVV strategy with tidal volumes of 6-8 cc/kg ideal body weight -Wean PEEP/FiO2 for SpO2 >92% -VAP bundle in place -family does not want tracheostomy -wean sedation for RASS 0 to -1  Iatrogenic hypernatremia: Due to hypertonic saline Plan: -trend bmp -cont free water  HTN HLD Diastolic CHF Plan: -resume statin -SBP goal <160 per neuro -sbp range 123-170; will add hctz -cont clonidine, losartan, norvasc -prn hydralazine for htn -hold on adding home coreg given HR 60s  Hypokalemia Plan: -replete k this am -  trend bmp and mag  Fever: Suspect central Plan: -cont cefepime for now -fever/wbc trending down; cont to trend -follow culture -cont bromocriptine and tylenol  DMT2 Plan: -ssi and cbg monitoring  Best Practice (right click and "Reselect all SmartList Selections" daily)    Diet/type: NPO DVT prophylaxis: SCD GI prophylaxis: PPI Lines: N/A Foley:  Yes, and it is still needed Code Status:  full code Last date of multidisciplinary goals of care discussion [ongoing GOC per primary]  CRITICAL CARE Performed by: Beth Sanford   Total critical care time: 35 minutes  Beth Sanford [facility] 07/27/2022, 10:24 AM  Please see [email or web address] for pager details.  From 7A-7P if no response, please Nudo [phone]. After hours, please Mckim ELink [phone].

## 2022-07-27 NOTE — Progress Notes (Addendum)
STROKE TEAM PROGRESS NOTE   BRIEF HPI [name] is a 52 y.o. female with history of hypertension, hyperlipidemia, BMI 29.19, obstructive sleep apnea, polysubstance abuse (tobacco, alcohol, cocaine, benzodiazepines, THC) presenting with minimal responsiveness and hypertensive emergency.  Patient was given Narcan by EMS due to pinpoint nonreactive pupils with no response. NIH 27.  Neurosurgery consulted, patient not a surgical candidate. LKW: 9 PM Thrombolytic given?: No, ICH IA performed?: No, ICH ICH Score: 4  SIGNIFICANT HOSPITAL EVENTS 7/20 early AM: Patient transferred to [facility] for medical management after head CT revealed acute ICH with IVH. Repeat CT shows unchanged large brainstem hemorrhage but increase in intraventricular clot. New mild hydrocephalus.  7/21 early AM: Patient began showing symptoms of herniation including hypertension, bradycardia and extensor posturing.   7/21 - hypertonic saline discontinued at 1149  INTERIM HISTORY/SUBJECTIVE RN at bedside.  On exam patient is intubated.  No sedation.  Negative doll's eyes, negative corneals, positive cough, no gag, no pupillary response.  Patient now exhibits bilateral extensor posturing to noxious stimuli,, triple flexion in bilateral lower extremities. Less response in upper extremities today. MRI today.    Using fentanyl for vent synchrony   OBJECTIVE  CBC    Component Value Date/Time   WBC 17.1 (H) 07/27/2022 0735   RBC 4.84 07/27/2022 0735   HGB 14.9 07/27/2022 0735   HCT 49.2 (H) 07/27/2022 0735   PLT 216 07/27/2022 0735   MCV 101.7 (H) 07/27/2022 0735   MCH 30.8 07/27/2022 0735   MCHC 30.3 07/27/2022 0735   RDW 13.7 07/27/2022 0735   LYMPHSABS 2.8 07/27/2022 0735   MONOABS 1.4 (H) 07/27/2022 0735   EOSABS 0.0 07/27/2022 0735   BASOSABS 0.1 07/27/2022 0735    BMET    Component Value Date/Time   NA 153 (H) 07/27/2022 0736   K 3.3 (L) 07/27/2022 0735   CL 113 (H) 07/27/2022 0735   CO2 33 (H)  07/27/2022 0735   GLUCOSE 138 (H) 07/27/2022 0735   BUN 17 07/27/2022 0735   CREATININE 0.65 07/27/2022 0735   CREATININE 0.72 01/27/2016 1432   CALCIUM 9.2 07/27/2022 0735   GFRNONAA >60 07/27/2022 0735    IMAGING past 24 hours No results found.  Vitals:   07/27/22 0600 07/27/22 0700 07/27/22 0751 07/27/22 0800  BP: 119/70 139/84 (!) 148/79 (!) 154/80  Pulse: (!) 53 64 (!) 55 67  Resp: 20 20 20 20   Temp:    99.7 F (37.6 C)  TempSrc:    Esophageal  SpO2: 100% 99% 97% 98%  Weight:      Height:         PHYSICAL EXAM General:  critically ill CV: Irregular, atrial fibrillation  Respiratory:  intubated GI: Abdomen soft and nontender   NEURO:  Patient is intubated, sedation off. Does not open eyes, with forced opening negative doll's eyes. No pupillary response.  Negative corneals, positive gag, positive cough.  Motor/Sensory:Triple flexion withdrawal to BLE. Extensor posturing with noxious stimuli in b/l upper extremity.   ASSESSMENT/PLAN  Intracerebral Hemorrhage with brainstem compression: Etiology: Likely hypertensive emergency exacerbated by cocaine use Code Stroke CT head  Acute intracranial hemorrhage involving brainstem, estimated value 10 ml Intraventricular extension with blood in the adjacent third and fourth ventricles CTA head & neck  6 mm blush of contrast within the central aspect of the brainstem hematoma consistent with spot sign/contrast extravasation. Repeat CT 7/20  2D Echo: EF 75%. No shunts. LDL 103 HgbA1c 6.5 VTE prophylaxis - SCDs No  antithrombotic prior to admission, now on No antithrombotic due to ICH/IVH Therapy recommendations:  pending Disposition:  pending  Cerebral edema Mild hydrocephalus Mannitol bolus given 3% bolus given 3% d/c'd 7/21 Na goal 150-155 currently, but may need to adjust to a WNL goal for further testing depending on CT scan results and any assessment changes.  Sodium levels 131 ->147 -> 155 -> -156 -> 160 ->  158 On free water flushes  Acute respiratory failure CCM management, appreciate their assistance Weaning propofol as able, on due to vent dyssynchrony CXR 7/20 shows vascular congestion Repeat CXR 7/21 shows patchy perihilar atelectasis Broad-spectrum atb ordered Cefepime pharmacy consult Resp culture ordered  Hypertension CHF Home meds: Coreg 6.25 twice daily, losartan 100 mg, amlodipine 10 mg, clonidine 0.1 mg twice daily. Restart amlodipine and losartan.  Cleviprex gtt. as needed, wean as able. Blood Pressure Goal: SBP less than 160   Hyperlipidemia Home meds: Simvastatin 80 mg LDL 103, goal < 70 Hold statin due to ICH Consider statin at discharge  Diabetes type II, uncontrolled Home meds: Metformin 500 daily, held HgbA1c 6.5, goal < 7.0 CBGs SSI Recommend close follow-up with PCP for better DM control  Tobacco Abuse Current smoker Unknown if ready to quit Nicotine replacement therapy provided  Substance Abuse Patient uses cocaine UDS positive for THC, benzodiazepines, cocaine Unknown if ready to quit [facility] consult for cessation placed  Dysphagia Patient has post-stroke dysphagia, SLP consulted    Diet   Diet NPO time specified   Advance diet as tolerated  Other Stroke Risk Factors ETOH use, alcohol level <10, advised to drink no more than 1-2 drink(s) a day Obesity, Body mass index is 29.19 kg/m., BMI >/= 30 associated with increased stroke risk, recommend weight loss, diet and exercise as appropriate  Obstructive sleep apnea  Other Active Problems Fever/Leukocytosis WBC 14.4->19.8 -> 17.1 Tmax 102.7 ?central, bromocriptine increased to TID CXR 7/20 shows vascular congestion Repeat CXR 7/21 shows patchy perihilar atelectasis Broad-spectrum atb ordered Cefepime pharmacy consult UA negative, repeat negative Blood cultures negative prelim Resp culture ordered Hypokalemia 7/21 -> 3.0 replaced- 3.3  Hospital day # 3  Patient seen and examined by  NP/APP with MD. MD to update note as needed.   Elmer Picker, DNP, FNP-BC [facility] Pager: [phone]    ATTENDING ATTESTATION:   52 year old with cocaine abuse brainstem intracranial hemorrhage with signs of herniation and posturing on exam.  No blink to threat, no reactive pupils, no oculocephalic movement.  No corneals.  Positive gag reflex.  Exam consistent with extremely poor prognosis.  Family still discussing GOC.   MRI today shows brainstem hemorrage.   Con't supportive care, appreciate CCM assistance.    Dr. Viviann Spare evaluated pt independently, reviewed imaging, chart, labs. Discussed and formulated plan with the Resident/APP. Changes were made to the note where appropriate. Please see APP/resident note above for details.     This patient is critically ill due to respiratory distress, intracranial hemorrhage and at significant risk of neurological worsening, death form heart failure, respiratory failure, recurrent stroke, bleeding from [facility], seizure, sepsis. This patient's care requires constant monitoring of vital signs, hemodynamics, respiratory and cardiac monitoring, review of multiple databases, neurological assessment, discussion with family, other specialists and medical decision making of high complexity. I spent 30 minutes of neurocritical care time in the care of this patient.    Jennene Downie,MD     To contact Stroke Continuity provider, please refer to [email or web address]. After hours, contact General Neurology

## 2022-07-27 NOTE — Progress Notes (Addendum)
Patient ID: Beth Sanford, female   DOB: 05-02-1970, 52 y.o.   MRN: [id number]   2  family members at the bedside.  One of whom is her niece.  ICU nurse was also in attendance.  I showed them the images of her MRI scan and the devastating brainstem hemorrhage.  I updated them  that her condition has not changed and she is still posturing.  They understood the gravity of the situation.  It appears that every body wants to withdraw care except her mother.  They will try to discussed with family.  All their questions answered to their satisfaction.  We will continue ongoing supportive care.

## 2022-07-28 DIAGNOSIS — Z515 Encounter for palliative care: Secondary | ICD-10-CM | POA: Diagnosis not present

## 2022-07-28 DIAGNOSIS — I619 Nontraumatic intracerebral hemorrhage, unspecified: Secondary | ICD-10-CM | POA: Diagnosis not present

## 2022-07-28 DIAGNOSIS — Z7189 Other specified counseling: Secondary | ICD-10-CM | POA: Diagnosis not present

## 2022-07-28 LAB — BASIC METABOLIC PANEL
Anion gap: 13 (ref 5–15)
BUN: 10 mg/dL (ref 6–20)
CO2: 31 mmol/L (ref 22–32)
Calcium: 9 mg/dL (ref 8.9–10.3)
Chloride: 99 mmol/L (ref 98–111)
Creatinine, Ser: 0.67 mg/dL (ref 0.44–1.00)
GFR, Estimated: >60 mL/min (ref >60)
Glucose, Bld: 173 mg/dL — ABNORMAL HIGH (ref 70–99)
Potassium: 3.3 mmol/L — ABNORMAL LOW (ref 3.5–5.1)
Sodium: 143 mmol/L (ref 135–145)

## 2022-07-28 LAB — CBC WITH DIFFERENTIAL/PLATELET
Abs Immature Granulocytes: 0.11 10*3/uL — ABNORMAL HIGH (ref 0.00–0.07)
Basophils Absolute: 0 10*3/uL (ref 0.0–0.1)
Basophils Relative: 0 %
Eosinophils Absolute: 0 10*3/uL (ref 0.0–0.5)
Eosinophils Relative: 0 %
HCT: 43.8 % (ref 36.0–46.0)
Hemoglobin: 14.1 g/dL (ref 12.0–15.0)
Immature Granulocytes: 1 %
Lymphocytes Relative: 17 %
Lymphs Abs: 3 10*3/uL (ref 0.7–4.0)
MCH: 30.9 pg (ref 26.0–34.0)
MCHC: 32.2 g/dL (ref 30.0–36.0)
MCV: 95.8 fL (ref 80.0–100.0)
Monocytes Absolute: 1.4 10*3/uL — ABNORMAL HIGH (ref 0.1–1.0)
Monocytes Relative: 8 %
Neutro Abs: 13.2 10*3/uL — ABNORMAL HIGH (ref 1.7–7.7)
Neutrophils Relative %: 74 %
Platelets: 208 10*3/uL (ref 150–400)
RBC: 4.57 MIL/uL (ref 3.87–5.11)
RDW: 13.5 % (ref 11.5–15.5)
WBC: 17.7 10*3/uL — ABNORMAL HIGH (ref 4.0–10.5)
nRBC: 0 % (ref 0.0–0.2)

## 2022-07-28 LAB — CULTURE, BLOOD (ROUTINE X 2): Culture: NO GROWTH

## 2022-07-28 LAB — GLUCOSE, CAPILLARY
Glucose-Capillary: 192 mg/dL — ABNORMAL HIGH (ref 70–99)
Glucose-Capillary: 174 mg/dL — ABNORMAL HIGH (ref 70–99)

## 2022-07-28 LAB — TRIGLYCERIDES: Triglycerides: 183 mg/dL — ABNORMAL HIGH (ref <150)

## 2022-07-28 MED ORDER — FREE WATER
200.0000 mL | Status: DC
  Administered 2022-07-28: 200 mL

## 2022-07-28 MED FILL — Fentanyl Citrate Preservative Free (PF) Inj 100 MCG/2ML: INTRAMUSCULAR | Qty: 2 | Status: AC

## 2022-07-29 LAB — CULTURE, BLOOD (ROUTINE X 2)

## 2022-08-05 NOTE — Progress Notes (Addendum)
STROKE TEAM PROGRESS NOTE   BRIEF HPI Ms. Nikyla C Sanford is a 52 y.o. female with history of hypertension, hyperlipidemia, BMI 29.19, obstructive sleep apnea, polysubstance abuse (tobacco, alcohol, cocaine, benzodiazepines, THC) presenting with minimal responsiveness and hypertensive emergency.  Patient was given Narcan by EMS due to pinpoint nonreactive pupils with no response. NIH 27.  Neurosurgery consulted, patient not a surgical candidate. LKW: 9 PM Thrombolytic given?: No, ICH IA performed?: No, ICH ICH Score: 4  SIGNIFICANT HOSPITAL EVENTS 7/20 early AM: Patient transferred to [facility] for medical management after head CT revealed acute ICH with IVH. Repeat CT shows unchanged large brainstem hemorrhage but increase in intraventricular clot. New mild hydrocephalus.  7/21 early AM: Patient began showing symptoms of herniation including hypertension, bradycardia and extensor posturing.   7/21 - hypertonic saline discontinued at 1149 7/23 - no cough or gag on exam today, no posturing or triple flexion on exam  INTERIM HISTORY/SUBJECTIVE RN at bedside. On exam no cough, no gag, pupils fixed and dilated. Palliative care at bedside as well and speaking with family  BP systolic in the 40s, HR 65  OBJECTIVE  CBC    Component Value Date/Time   WBC 17.7 (H) 30-Jul-2022 0630   RBC 4.57 2022-07-30 0630   HGB 14.1 Jul 30, 2022 0630   HCT 43.8 2022/07/30 0630   PLT 208 07/30/22 0630   MCV 95.8 07-30-22 0630   MCH 30.9 July 30, 2022 0630   MCHC 32.2 07-30-22 0630   RDW 13.5 Jul 30, 2022 0630   LYMPHSABS 3.0 07-30-2022 0630   MONOABS 1.4 (H) Jul 30, 2022 0630   EOSABS 0.0 Jul 30, 2022 0630   BASOSABS 0.0 30-Jul-2022 0630    BMET    Component Value Date/Time   NA 143 07/30/2022 0630   K 3.3 (L) 2022/07/30 0630   CL 99 07/30/2022 0630   CO2 31 30-Jul-2022 0630   GLUCOSE 173 (H) 07/30/2022 0630   BUN 10 Jul 30, 2022 0630   CREATININE 0.67 2022/07/30 0630   CREATININE 0.72 01/27/2016 1432    CALCIUM 9.0 07/30/22 0630   GFRNONAA >60 30-Jul-2022 0630    IMAGING past 24 hours MR BRAIN WO CONTRAST  Result Date: 07/27/2022 CLINICAL DATA:  Stroke, hemorrhagic EXAM: MRI HEAD WITHOUT CONTRAST TECHNIQUE: Multiplanar, multiecho pulse sequences of the brain and surrounding structures were obtained without intravenous contrast. COMPARISON:  CT head 07/25/2022. FINDINGS: Brain: When comparing across modalities, no substantial change in brainstem hemorrhage with intraventricular extension. Surrounding edema with effacement of the basal cisterns and early descending transtentorial herniation. Similar mild hydrocephalus. No evidence of surrounding acute infarct or visible mass lesion; however, acute blood products limit assessment. No midline shift. Vascular: Major arterial flow voids are maintained. Skull and upper cervical spine: Normal marrow signal. Sinuses/Orbits: Negative. IMPRESSION: 1. When comparing across modalities, no substantial change in brainstem hemorrhage with intraventricular extension. Surrounding edema with effacement of the basal cisterns and early ascending transtentorial herniation. Similar mild hydrocephalus. 2. No evidence of surrounding acute infarct or visible mass lesion; however, acute blood products limit assessment. Electronically Signed   By: Feliberto Harts M.D.   On: 07/27/2022 14:16    Vitals:   Jul 30, 2022 0730 07-30-22 0800 30-Jul-2022 0830 30-Jul-2022 0900  BP: (!) 143/86 (!) 145/90 104/65 (!) 45/27  Pulse: 83 79 74 64  Resp: 20 20 20 20   Temp:  98.7 F (37.1 C)    TempSrc:  Oral    SpO2: 96% 95% 97% 97%  Weight:      Height:  PHYSICAL EXAM General:  critically ill CV: Irregular, atrial fibrillation  Respiratory:  intubated GI: Abdomen soft and nontender   NEURO:  Patient is intubated, sedation off. Does not open eyes, with forced opening negative doll's eyes. Pupils fixed and dilated Negative corneals, negative gag, negative  cough Motor/Sensory:no response to noxious stimuli   ASSESSMENT/PLAN  Intracerebral Hemorrhage with brainstem compression: Etiology: Likely hypertensive emergency exacerbated by cocaine use Code Stroke CT head  Acute intracranial hemorrhage involving brainstem, estimated value 10 ml Intraventricular extension with blood in the adjacent third and fourth ventricles CTA head & neck  6 mm blush of contrast within the central aspect of the brainstem hematoma consistent with spot sign/contrast extravasation. Repeat CT 7/20  2D Echo: EF 75%. No shunts. LDL 103 HgbA1c 6.5 VTE prophylaxis - SCDs No antithrombotic prior to admission, now on No antithrombotic due to ICH/IVH Therapy recommendations:  pending Disposition:  pending  Cerebral edema Mild hydrocephalus Mannitol bolus given 3% bolus given 3% d/c'd 7/21 Na goal 150-155 currently, but may need to adjust to a WNL goal for further testing depending on CT scan results and any assessment changes.  Sodium levels 131 ->147 -> 155 -> -156 -> 160 -> 158 On free water flushes  Acute respiratory failure CCM management, appreciate their assistance Weaning propofol as able, on due to vent dyssynchrony CXR 7/20 shows vascular congestion Repeat CXR 7/21 shows patchy perihilar atelectasis Broad-spectrum atb ordered Cefepime pharmacy consult Resp culture ordered  Hypertension CHF Home meds: Coreg 6.25 twice daily, losartan 100 mg, amlodipine 10 mg, clonidine 0.1 mg twice daily. Restart amlodipine and losartan.  Blood Pressure Goal: SBP less than 160   Hyperlipidemia Home meds: Simvastatin 80 mg LDL 103, goal < 70 Hold statin due to ICH Consider statin at discharge  Diabetes type II, uncontrolled Home meds: Metformin 500 daily, held HgbA1c 6.5, goal < 7.0 CBGs SSI Recommend close follow-up with PCP for better DM control  Tobacco Abuse Current smoker Unknown if ready to quit Nicotine replacement therapy provided  Substance  Abuse Patient uses cocaine UDS positive for THC, benzodiazepines, cocaine Unknown if ready to quit [facility] consult for cessation placed  Dysphagia Patient has post-stroke dysphagia, SLP consulted    Diet   Diet NPO time specified    Other Stroke Risk Factors ETOH use, alcohol level <10, advised to drink no more than 1-2 drink(s) a day Obesity, Body mass index is 29.15 kg/m., BMI >/= 30 associated with increased stroke risk, recommend weight loss, diet and exercise as appropriate  Obstructive sleep apnea  Other Active Problems Fever/Leukocytosis WBC 14.4->19.8 -> 17.1 Tmax 102.7 ?central, bromocriptine increased to TID CXR 7/20 shows vascular congestion Repeat CXR 7/21 shows patchy perihilar atelectasis Broad-spectrum atb ordered Cefepime pharmacy consult UA negative, repeat negative Blood cultures negative prelim Resp culture ordered Hypokalemia 7/21 -> 3.0 replaced- 3.3  Hospital day # 4  Patient seen and examined by NP/APP with MD. MD to update note as needed.   Elmer Picker, DNP, FNP-BC [facility] Pager: [phone]   ATTENDING ATTESTATION:   52 year old with cocaine abuse brainstem intracranial hemorrhage appreciate CCM assistance.  She worsened yesterday evening had dilated pupils per ICU nurse.  This morning she has both pupils are 5 mm and reactive.  No corneals, no cough no gag.  No movement in upper or lower extremities to painful stimuli.  This patient has herniated.  Discussed with CCM as well as palliative care and ICU nurse.  Palliative care was able to update her  niece but unable to get in touch with her mother.  She left messages.   Dr. Viviann Spare evaluated pt independently, reviewed imaging, chart, labs. Discussed and formulated plan with the Resident/APP. Changes were made to the note where appropriate. Please see APP/resident note above for details.     This patient is critically ill due to respiratory distress, intracranial hemorrhage and at  significant risk of neurological worsening, death form heart failure, respiratory failure, recurrent stroke, bleeding from [facility], seizure, sepsis. This patient's care requires constant monitoring of vital signs, hemodynamics, respiratory and cardiac monitoring, review of multiple databases, neurological assessment, discussion with family, other specialists and medical decision making of high complexity. I spent 30 minutes of neurocritical care time in the care of this patient.    Maryiah Olvey,MD   To contact Stroke Continuity provider, please refer to [email or web address]. After hours, contact General Neurology

## 2022-08-05 NOTE — TOC CAGE-AID Note (Signed)
Transition of Care [facility]) - CAGE-AID Screening   Patient Details  Name: Beth Sanford MRN: [id number] Date of Birth: Dec 20, 1970  Transition of Care [facility]) CM/SW Contact:    Mearl Latin, LCSW Phone Number: 08/02/2022, 11:47 AM   Clinical Narrative: Patient is intubated and unable to participate in screening.    CAGE-AID Screening: Substance Abuse Screening unable to be completed due to: : Patient unable to participate

## 2022-08-05 NOTE — Progress Notes (Signed)
RT obtained enough blood to fill EG7 cartridge to run ABG on ISTAT. ISTAT gave a code "poor contact" so RT was unable to get results from ABG. MD will be notified.

## 2022-08-05 NOTE — Progress Notes (Signed)
Patient ID: [name] Amyx, female   DOB: [birth year], 52 y.o.   MRN: [id number]   Discussed with patient's mother that she has fully herniated.  She is accepting of this.  She wants to remain intubated until there is cessation of heart rate which I think is [employer] account [employer].  She understands this.  All questions were answered to her satisfaction.  Appreciate palliative care extensive involvement.

## 2022-08-05 NOTE — Progress Notes (Signed)
Time of death 1800.

## 2022-08-05 NOTE — Progress Notes (Signed)
On assessment at 0845, pt noted to be without cough reflex and BP 43/31 (37).  Dr. Viviann Spare at bedside and Dr. Judeth Horn notified.  Awaiting orders.

## 2022-08-05 NOTE — Progress Notes (Signed)
NAMEKearsten Ginther Sanford, MRN:  [id number], DOB:  Feb 19, 1970, LOS: 4 ADMISSION DATE:  07/30/2022, CONSULTATION DATE:  07/24/22 REFERRING MD:  Dr Iver Nestle, CHIEF COMPLAINT:  brainstem hemorrage, resp failure   History of Present Illness:  52 yo female with pmh drug abuse, htn, hld, osa found down on 7/19 and ems called. Pt retained pulses but was minimally responsive and hypertensive. 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 Case was reportedly d/w neurosx who did not feel the pt was a surgical candidate however family desires full code and thus pt was transferred to [facility] for further monitoring.   All history is obtained from chart and neuro as pt is unresponsive on vent.   CCM has been consulted for vent management  Pertinent  Medical History  Drug use Htn Hyperlipidemia OSA  Significant Hospital Events: Including procedures, antibiotic start and stop dates in addition to other pertinent events   Presented to [facility] with weakness Transferred to [facility] 2/2 large hemorrhage with extension into ventricles 7/20  Interim History / Subjective:  Palliative spoke with family yesterday regarding poor prognosis. Patient made DNR. Resp culture w/ strep pneum and abx narrowed to pen g  Patient now with pupils fixed/dilated, no corneal, no cough/gag, no response to pain Family notified by nurse On mech vent  Objective   Blood pressure 136/85, pulse 73, temperature 100 F (37.8 C), temperature source Axillary, resp. rate (!) 21, height 5\' 2"  (1.575 m), weight 72.3 kg, last menstrual period 03/05/2018, SpO2 96%.    Vent Mode: PRVC FiO2 (%):  [40 %] 40 % Set Rate:  [20 bmp] 20 bmp Vt Set:  [410 mL] 410 mL PEEP:  [5 cmH20] 5 cmH20 Plateau Pressure:  [13 cmH20-15 cmH20] 14 cmH20   Intake/Output Summary (Last 24 hours) at August 06, 2022 4098 Last data filed at Aug 06, 2022 0600 Gross per 24 hour  Intake 2096.53  ml  Output 1260 ml  Net 836.53 ml   Filed Weights   07/24/22 0002 08/06/22 0711  Weight: 72.4 kg 72.3 kg    Examination: General:  critically ill appearing on mech vent HEENT: MM pink/moist; ETT in place Neuro: pupils fixed/dilated, no corneal, no cough/gag, no response to pain CV: s1s2, brady 50-60s, no m/r/g PULM:  dim clear BS bilaterally; on mech vent PRVC GI: soft, bsx4 active  Extremities: warm/dry, no edema  Skin: no rashes or lesions appreciated   Resolved Hospital Problem list     Assessment & Plan:  ICH w/ brainstem compression -likely HTN emergency triggered by cocaine use Cerebral Edema Mild hydrocephalus Acute respiratory failure in seeting of severe encephalopathy: due to neurologic devastation in the setting of brainstem bleed Strep pneumonia Iatrogenic hypernatremia: Due to hypertonic saline HTN HLD Diastolic CHF Hypokalemia 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 Plan: -Patient now appears to be herniating and neuro exam is much worse -family updated by nurse  -will speak with family on arrival and have GOC conversation -patient is currently DNR and no escalation in care -cont supportive care   Best Practice (right click and "Reselect all SmartList Selections" daily)   Diet/type: NPO DVT prophylaxis: SCD GI prophylaxis: PPI Lines: N/A Foley:  Yes, and it is still needed Code Status:  DNR Last date of multidisciplinary goals of care discussion [awaiting family to arrive and will have GOC conversation]  CRITICAL CARE Performed by: Lidia Collum   Total critical care time: 35 minutes  JD Suzie Portela,  PA-C [facility] Aug 07, 2022, 7:22 AM  Please see [email or web address] for pager details.  From 7A-7P if no response, please [place] [phone]. After hours, please [place] [facility] [phone].

## 2022-08-05 NOTE — Progress Notes (Signed)
Palliative:  HPI:  52 y.o. female with a past medical history significant for hypertension, hyperlipidemia, BMI 29.19, obstructive sleep apnea, polysubstance abuse (tobacco, alcohol, cocaine, benzodiazepines, THC). 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 Case was reportedly d/w neurosx who did not feel the pt was a surgical candidate however family desires full code and thus pt was transferred to [facility] for further monitoring. The palliative care team has been asked to get involved in the setting of a large intracranial hemorrhage to further discuss goals of care.   I came to bedside and Beth Sanford is having acute decline with SBP 40s and no longer any clinical signs of brain function - no gag reflex, not initiating any breathes. Discussed with RN, neurology, PCCM. I called and was unable to reach mother, Beth Sanford, with multiple unsuccessful attempts. I did [place] and speak with niece, Hailee. I discussed with Hailee the changes in Beth Sanford's condition and that she appears to be at end of life. I expressed my concern that she may die at any moment in her current condition.   I met with multiple family and friends throughout the day including [place], Hospital doctor, brother, Dorene Sorrow, and later mother Beth Sanford. All express understanding after our discussion that Beth Sanford is dying and there is nothing more that we can do. They are accepting that she is at end of life. Beth Sanford does not want to extubate.   All questions/concerns addressed. Emotional support provided.   Exam: Unresponsive. Cool to touch. Color changing - more dusky. Not breathing over the vent. No gag reflex. Abd soft. BP critically low. HR dropping into 40s.   Plan: - DNR - Family understanding that Nyema is actively dying and no plans for measures to prolong her dying process will be made. Mother not prepared for extubation.   70 min  Yong Channel, NP Palliative Medicine Team Pager [phone] (Please see [email or web address] for  schedule) Team Phone [phone]    Greater than 50%  of this time was spent counseling and coordinating care related to the above assessment and plan

## 2022-08-05 NOTE — Death Summary Note (Addendum)
Patient ID: Beth Sanford MRN: [id number] DOB/AGE: 08-20-1970 52 y.o.  Admit date: 08/03/2022 Death date: 08/08/22 @ 1800.  Admission Diagnoses: Intracerebral hemorrhage within the brainstem, likely caused by hypertensive emergency exacerbated by cocaine use  Cause of Death: Intracerebral hemorrhage  Pertinent Medical Diagnosis: Principal Problem:   Hemorrhagic stroke [facility]) Active Problems:   Malnutrition of moderate degree   Acute respiratory failure [facility])   Hospital Course: On 7/20, patient came to the hospital after being found in a minimally responsive state.  CT head demonstrated acute intracranial hemorrhage and brainstem/pons.  Neurosurgery was consulted, but patient was not a surgical candidate.  She was intubated for airway protection and admitted to the ICU for supportive care.  She was treated with hypertonic saline.  She began to show signs of herniation on 7/21, and herniation was noted to be complete with no brainstem reflexes on 08-08-22.  Formal brain death testing was not performed, and time of cardiac death was at 1800 on 08-08-2022.  Signed: Marjorie Smolder 07/29/2022, 1:16 PM

## 2022-08-05 DEATH — deceased
